# Patient Record
Sex: Male | Born: 1945
Health system: Southern US, Community
[De-identification: ages and names within clinical notes are randomized; demographics above are authoritative.]

## PROBLEM LIST (undated history)

## (undated) DIAGNOSIS — Z8719 Personal history of other diseases of the digestive system: Secondary | ICD-10-CM

## (undated) DIAGNOSIS — I219 Acute myocardial infarction, unspecified: Secondary | ICD-10-CM

## (undated) DIAGNOSIS — I77810 Thoracic aortic ectasia: Secondary | ICD-10-CM

## (undated) DIAGNOSIS — F1120 Opioid dependence, uncomplicated: Secondary | ICD-10-CM

## (undated) DIAGNOSIS — R112 Nausea with vomiting, unspecified: Secondary | ICD-10-CM

## (undated) DIAGNOSIS — I6529 Occlusion and stenosis of unspecified carotid artery: Secondary | ICD-10-CM

## (undated) DIAGNOSIS — Z9889 Other specified postprocedural states: Secondary | ICD-10-CM

## (undated) DIAGNOSIS — N200 Calculus of kidney: Secondary | ICD-10-CM

## (undated) DIAGNOSIS — E785 Hyperlipidemia, unspecified: Secondary | ICD-10-CM

## (undated) DIAGNOSIS — E291 Testicular hypofunction: Secondary | ICD-10-CM

## (undated) DIAGNOSIS — I2510 Atherosclerotic heart disease of native coronary artery without angina pectoris: Secondary | ICD-10-CM

## (undated) DIAGNOSIS — I371 Nonrheumatic pulmonary valve insufficiency: Secondary | ICD-10-CM

## (undated) DIAGNOSIS — I10 Essential (primary) hypertension: Secondary | ICD-10-CM

## (undated) DIAGNOSIS — M1990 Unspecified osteoarthritis, unspecified site: Secondary | ICD-10-CM

## (undated) DIAGNOSIS — IMO0001 Reserved for inherently not codable concepts without codable children: Secondary | ICD-10-CM

## (undated) DIAGNOSIS — I5032 Chronic diastolic (congestive) heart failure: Secondary | ICD-10-CM

## (undated) DIAGNOSIS — I5042 Chronic combined systolic (congestive) and diastolic (congestive) heart failure: Secondary | ICD-10-CM

## (undated) DIAGNOSIS — R350 Frequency of micturition: Secondary | ICD-10-CM

## (undated) DIAGNOSIS — N059 Unspecified nephritic syndrome with unspecified morphologic changes: Secondary | ICD-10-CM

## (undated) DIAGNOSIS — F419 Anxiety disorder, unspecified: Secondary | ICD-10-CM

## (undated) DIAGNOSIS — H9190 Unspecified hearing loss, unspecified ear: Secondary | ICD-10-CM

## (undated) HISTORY — DX: Chronic combined systolic (congestive) and diastolic (congestive) heart failure: I50.42

## (undated) HISTORY — DX: Nonrheumatic pulmonary valve insufficiency: I37.1

## (undated) HISTORY — DX: Chronic diastolic (congestive) heart failure: I50.32

## (undated) HISTORY — PX: OTHER SURGICAL HISTORY: SHX169

## (undated) HISTORY — DX: Hyperlipidemia, unspecified: E78.5

## (undated) HISTORY — PX: BILATERAL KNEE ARTHROSCOPY: SUR91

## (undated) HISTORY — PX: CYSTOSCOPY: SUR368

## (undated) HISTORY — PX: CORONARY ARTERY BYPASS GRAFT: SHX141

## (undated) HISTORY — DX: Occlusion and stenosis of unspecified carotid artery: I65.29

## (undated) HISTORY — DX: Thoracic aortic ectasia: I77.810

## (undated) HISTORY — PX: CORONARY ANGIOPLASTY: SHX604

## (undated) HISTORY — PX: LUMBAR LAMINECTOMY: SHX95

---

## 1989-08-30 HISTORY — PX: ANGIOPLASTY: SHX39

## 1989-10-31 DIAGNOSIS — I219 Acute myocardial infarction, unspecified: Secondary | ICD-10-CM

## 1989-10-31 HISTORY — DX: Acute myocardial infarction, unspecified: I21.9

## 1996-08-30 HISTORY — PX: HERNIA REPAIR: SHX51

## 1998-05-01 ENCOUNTER — Ambulatory Visit (HOSPITAL_COMMUNITY): Admission: RE | Admit: 1998-05-01 | Discharge: 1998-05-01 | Payer: Self-pay | Admitting: Neurosurgery

## 1998-05-01 ENCOUNTER — Encounter: Payer: Self-pay | Admitting: Neurosurgery

## 1998-05-12 ENCOUNTER — Ambulatory Visit (HOSPITAL_COMMUNITY): Admission: RE | Admit: 1998-05-12 | Discharge: 1998-05-12 | Payer: Self-pay | Admitting: Neurosurgery

## 1998-05-12 ENCOUNTER — Encounter: Payer: Self-pay | Admitting: Neurosurgery

## 1998-11-01 ENCOUNTER — Ambulatory Visit (HOSPITAL_COMMUNITY): Admission: RE | Admit: 1998-11-01 | Discharge: 1998-11-01 | Payer: Self-pay | Admitting: Neurosurgery

## 1998-11-01 ENCOUNTER — Encounter: Payer: Self-pay | Admitting: Neurosurgery

## 1998-11-03 ENCOUNTER — Encounter: Payer: Self-pay | Admitting: Cardiology

## 1998-11-03 ENCOUNTER — Encounter: Payer: Self-pay | Admitting: Neurosurgery

## 1998-11-03 ENCOUNTER — Inpatient Hospital Stay (HOSPITAL_COMMUNITY): Admission: RE | Admit: 1998-11-03 | Discharge: 1998-11-08 | Payer: Self-pay | Admitting: Cardiology

## 1998-11-04 ENCOUNTER — Encounter: Payer: Self-pay | Admitting: Thoracic Surgery (Cardiothoracic Vascular Surgery)

## 1998-11-05 ENCOUNTER — Encounter: Payer: Self-pay | Admitting: Thoracic Surgery (Cardiothoracic Vascular Surgery)

## 1998-11-06 ENCOUNTER — Encounter: Payer: Self-pay | Admitting: Thoracic Surgery (Cardiothoracic Vascular Surgery)

## 1998-12-02 ENCOUNTER — Encounter (HOSPITAL_COMMUNITY): Admission: RE | Admit: 1998-12-02 | Discharge: 1999-03-02 | Payer: Self-pay | Admitting: Cardiology

## 1999-03-03 ENCOUNTER — Encounter (HOSPITAL_COMMUNITY): Admission: RE | Admit: 1999-03-03 | Discharge: 1999-06-01 | Payer: Self-pay | Admitting: Cardiology

## 1999-04-30 ENCOUNTER — Ambulatory Visit (HOSPITAL_COMMUNITY): Admission: RE | Admit: 1999-04-30 | Discharge: 1999-04-30 | Payer: Self-pay | Admitting: Neurosurgery

## 1999-06-15 ENCOUNTER — Ambulatory Visit (HOSPITAL_COMMUNITY): Admission: RE | Admit: 1999-06-15 | Discharge: 1999-06-15 | Payer: Self-pay | Admitting: Neurosurgery

## 1999-06-15 ENCOUNTER — Encounter: Payer: Self-pay | Admitting: Neurosurgery

## 2000-02-24 ENCOUNTER — Encounter: Admission: RE | Admit: 2000-02-24 | Discharge: 2000-02-24 | Payer: Self-pay | Admitting: Urology

## 2000-02-24 ENCOUNTER — Encounter: Payer: Self-pay | Admitting: Urology

## 2000-06-14 ENCOUNTER — Ambulatory Visit (HOSPITAL_COMMUNITY): Admission: RE | Admit: 2000-06-14 | Discharge: 2000-06-14 | Payer: Self-pay | Admitting: Neurosurgery

## 2000-06-14 ENCOUNTER — Encounter: Payer: Self-pay | Admitting: Neurosurgery

## 2000-06-14 ENCOUNTER — Ambulatory Visit (HOSPITAL_COMMUNITY): Admission: RE | Admit: 2000-06-14 | Discharge: 2000-06-14 | Payer: Self-pay | Admitting: Gastroenterology

## 2000-09-12 ENCOUNTER — Encounter: Admission: RE | Admit: 2000-09-12 | Discharge: 2000-09-30 | Payer: Self-pay | Admitting: Neurosurgery

## 2000-10-25 ENCOUNTER — Emergency Department (HOSPITAL_COMMUNITY): Admission: EM | Admit: 2000-10-25 | Discharge: 2000-10-25 | Payer: Self-pay

## 2001-05-04 ENCOUNTER — Encounter: Payer: Self-pay | Admitting: Urology

## 2001-05-04 ENCOUNTER — Encounter: Admission: RE | Admit: 2001-05-04 | Discharge: 2001-05-04 | Payer: Self-pay | Admitting: Urology

## 2001-05-08 ENCOUNTER — Encounter: Payer: Self-pay | Admitting: Urology

## 2001-05-08 ENCOUNTER — Encounter: Admission: RE | Admit: 2001-05-08 | Discharge: 2001-05-08 | Payer: Self-pay | Admitting: Urology

## 2001-05-25 ENCOUNTER — Encounter: Payer: Self-pay | Admitting: Anesthesiology

## 2001-05-30 ENCOUNTER — Ambulatory Visit (HOSPITAL_COMMUNITY): Admission: RE | Admit: 2001-05-30 | Discharge: 2001-05-30 | Payer: Self-pay | Admitting: Urology

## 2004-08-30 HISTORY — PX: NISSEN FUNDOPLICATION: SHX2091

## 2005-05-11 ENCOUNTER — Ambulatory Visit (HOSPITAL_COMMUNITY): Admission: RE | Admit: 2005-05-11 | Discharge: 2005-05-11 | Payer: Self-pay | Admitting: Pain Medicine

## 2005-08-20 ENCOUNTER — Ambulatory Visit (HOSPITAL_COMMUNITY): Admission: RE | Admit: 2005-08-20 | Discharge: 2005-08-20 | Payer: Self-pay | Admitting: Orthopedic Surgery

## 2006-07-04 ENCOUNTER — Encounter: Admission: RE | Admit: 2006-07-04 | Discharge: 2006-07-04 | Payer: Self-pay | Admitting: Neurosurgery

## 2007-01-17 ENCOUNTER — Inpatient Hospital Stay (HOSPITAL_COMMUNITY): Admission: EM | Admit: 2007-01-17 | Discharge: 2007-01-18 | Payer: Self-pay | Admitting: Emergency Medicine

## 2007-01-17 HISTORY — PX: CARDIAC CATHETERIZATION: SHX172

## 2009-04-28 ENCOUNTER — Encounter: Admission: RE | Admit: 2009-04-28 | Discharge: 2009-04-28 | Payer: Self-pay | Admitting: Neurosurgery

## 2010-07-17 ENCOUNTER — Ambulatory Visit (HOSPITAL_COMMUNITY)
Admission: RE | Admit: 2010-07-17 | Discharge: 2010-07-17 | Payer: Self-pay | Source: Home / Self Care | Admitting: Orthopedic Surgery

## 2010-09-21 ENCOUNTER — Encounter: Payer: Self-pay | Admitting: Neurosurgery

## 2010-11-10 LAB — BASIC METABOLIC PANEL
BUN: 13 mg/dL (ref 6–23)
CO2: 33 mEq/L — ABNORMAL HIGH (ref 19–32)
Calcium: 9.7 mg/dL (ref 8.4–10.5)
Creatinine, Ser: 0.86 mg/dL (ref 0.4–1.5)
Glucose, Bld: 83 mg/dL (ref 70–99)

## 2010-11-10 LAB — DIFFERENTIAL
Basophils Relative: 0 % (ref 0–1)
Eosinophils Absolute: 0 10*3/uL (ref 0.0–0.7)
Monocytes Relative: 10 % (ref 3–12)
Neutrophils Relative %: 64 % (ref 43–77)

## 2010-11-10 LAB — PROTIME-INR: INR: 0.93 (ref 0.00–1.49)

## 2010-11-10 LAB — URINALYSIS, ROUTINE W REFLEX MICROSCOPIC
Glucose, UA: NEGATIVE mg/dL
Ketones, ur: NEGATIVE mg/dL
pH: 7 (ref 5.0–8.0)

## 2010-11-10 LAB — CBC
MCH: 31.8 pg (ref 26.0–34.0)
MCHC: 35 g/dL (ref 30.0–36.0)
Platelets: 146 10*3/uL — ABNORMAL LOW (ref 150–400)

## 2011-01-12 NOTE — Cardiovascular Report (Signed)
Hayden [name], ELLEN NO.:  [id number]   MEDICAL RECORD NO.:  [id number]          PATIENT TYPE:  INP   LOCATION:  3707                         FACILITY:  [facility]   PHYSICIAN:  Peter M. Swaziland, M.D.  DATE OF BIRTH:  [birth year]   DATE OF PROCEDURE:  01/17/2007  DATE OF DISCHARGE:                            CARDIAC CATHETERIZATION   INDICATIONS FOR PROCEDURE:  The patient is 65 year old white male status  post coronary artery bypass surgery in 2000 who presents with increased  chest pain.   PROCEDURES:  Left heart catheterization, coronary and left ventricular  angiography, saphenous vein graft angiography x1, LIMA graft angiography  and aortic root angiography.   EQUIPMENT USED:  6-French 4 cm right and left Judkins catheter, 6-French  pigtail catheter, 6-French arterial sheath, 6-French multipurpose  catheter, 6-French RCV catheter.   ACCESS:  Via the right femoral artery using standard Seldinger  technique.   MEDICATIONS:  Versed 2 mg IV, fentanyl 25 mg IV, metoprolol 5 mg IV.   HEMODYNAMIC DATA:  Aortic pressures 153/74 with mean of 111. Left  ventricle pressure is 154 with EDP of 10 mmHg.   CONTRAST:  210 mL of Omnipaque.   ANGIOGRAPHIC DATA:  The left coronary arises and distributes normally.  The left main coronary is short and appears normal.   The left anterior descending artery has a 95% stenosis proximally and is  occluded after the first septal perforator.   The left circumflex coronary has a 90% ostial stenosis.  It is then  occluded after the first obtuse marginal vessel.  It does give  collateral flow to the distal right coronary.   The right coronary is occluded proximally.   The saphenous vein graft to the third obtuse marginal vessel is widely  patent.  It has excellent distal runoff and supplies the entire mid to  distal circumflex as well as giving collateral blood flow to the distal  right coronary.   The free RIMA graft to the  PDA could not be directly engaged.  Per the  operative note, it arises from the same aortotomy site as the vein  graft.  However, using multiple catheters including those listed above  were unable to directly engage it or see any flush of the origin.   The LIMA graft to the LAD is widely patent with good distal runoff.  The  far distal LAD as it wraps around the apex there is 80-90% stenosis in  one terminal branch of the LAD.   The left ventricular angiography was performed in RAO view.  This  demonstrates normal left ventricular size and contractility with normal  systolic function.  Ejection fraction is estimated 65%.  There is no  significant mitral insufficiency.   Aortic root angiography was performed in LAO view.  This demonstrates  normal aortic root size with no aortic insufficiency.  Again we were  unable to visualize any flow into the RIMA graft.   FINAL INTERPRETATION:  1. Severe three-vessel obstructive atherosclerotic coronary artery      disease.  2. Patent saphenous vein graft to the third  obtuse marginal vessel.  3. Patent LIMA graft to the LAD.  4. Occluded at free RIMA graft to the PDA.  The PDA is supplied by      collateral flow.  5. Normal left ventricular function.  6. Normal aortic root.   PLAN:  I would recommend continued medical therapy.           ______________________________  Peter M. Swaziland, M.D.     PMJ/MEDQ  D:  01/17/2007  T:  01/17/2007  Job:  [id number]   cc:   Cassell Clement, M.D.

## 2011-01-12 NOTE — H&P (Signed)
NAMEDENSIL, OTTEY NO.:  [id number]   MEDICAL RECORD NO.:  [id number]          PATIENT TYPE:  EMS   LOCATION:  MAJO                         FACILITY:  [facility]   PHYSICIAN:  Ulyses Amor, MD DATE OF BIRTH:  1946/01/30   DATE OF PROCEDURE:  DATE OF DISCHARGE:                    STAT - MUST CHANGE TO CORRECT WORK TYPE   Hayden [name] is a 64 year old white man who is admitted to [facility] with a complaint of indigestion in order to exclude unstable  angina.   The patient has a history of coronary artery disease.  He suffered a  remote myocardial infarction.  In 1991 he underwent PTCA of the  circumflex.  In 2000 he underwent coronary artery bypass surgery.  He  received a left internal mammary artery graft for the LAD, a right  internal mammary artery graft to the PDA, and a saphenous vein graft to  the third obtuse marginal.  His cardiac course has been uncomplicated  since then.  He has experienced no recurrent chest pain.  Nor has he  experienced any congestive heart failure or arrhythmia.   The patient was eating dinner this evening when he experienced the  sensation of indigestion across his chest.  This was associated with  diaphoresis.  There was no dyspnea or nausea.  It persisted for  approximately 2 hours and ultimately resulted in his visit to the  emergency department.  His discomfort has resolved since then.  There  were no exacerbating or ameliorating factors.  It appeared to be  unrelated to position, activity, meals or respirations.  He did not  experience a sensation of tightness, pressure, or squeezing.  He notes  that this chest discomfort is different from that which he experienced  with his myocardial infarction and prior to his PTCA and coronary artery  bypass surgery.  He is asymptomatic at this time.   The patient has a number of risk factors for coronary artery disease  including hypertension, dyslipidemia, and family  history (both parents).  There was no history of smoking or diabetes mellitus.   The patient has a history of gastroesophageal reflux.  He notes that  today's chest discomfort is different from his typical reflux  discomfort.  He also has a history of nephrolithiasis.  His past medical  history is otherwise unremarkable.   MEDICATIONS:  1. Cozaar.  2. Crestor.  3. Ziac.  4. Zoloft.   ALLERGIES:  SULFA.   OPERATIONS:  Lumbar laminectomy, lithotripsy, inguinal hernia repair,  coronary artery bypass surgery.   SOCIAL:  The patient lives with his wife.  He works for [employer].  He  does not smoke cigarettes.  He does not drink significant alcohol.   FAMILY HISTORY:  Notable for coronary artery disease in both of his  parents.   REVIEW OF SYSTEMS:  Reveals no new problems related to his head, eyes,  ears, nose, mouth, throat, lungs, gastrointestinal system, genitourinary  system, or extremities.  There is no history of neurologic or  psychiatric disorder.  There is no history of fever, chills, or weight  loss.   PHYSICAL  EXAMINATION:  VITAL SIGNS: Blood pressure 157/71, pulse 74 and  regular, respirations 22, temperature 97.6.  IN GENERAL: The patient was a middle-aged white man in no discomfort.  He was alert, oriented, appropriate, and responsive.  HEAD, EYES, NOSE, AND MOUTH: Normal.  The neck was without thyromegaly  or adenopathy.  Carotid pulses were palpable bilaterally and without  bruits.  CARDIAC: Revealed a normal S1/S2.  There was no S3, S4, murmur, rub, or  click.  Cardiac rhythm is normal.  No chest wall tenderness was noted.  LUNGS: Clear.  ABDOMEN: Soft and nontender.  There was no mass, hepatosplenomegaly,  bruit, distention, rebound, guarding or rigidity.  Bowel sounds were  normal.  RECTAL AND GENITAL: Examinations were not performed as they were not  pertinent to the reason for acute care hospitalization.  EXTREMITIES: Without edema, deviation, or  deformity.  Radial and  dorsalis pedal pulses were palpable bilaterally.  NEURO: Brief screening neurologic survey was unremarkable.   Electrocardiogram revealed normal sinus rhythm.  Left atrial enlargement  was noted.  There was T-wave inversion and ST segment depression in the  anterior lateral leads.  The chest radiograph, according to the  radiologist, demonstrated minimal atelectasis versus scarring of the  left base.  The initial set of cardiac markers revealed a myoglobin of  58.7, CK-MB 1.2, and troponin less than 0.05.  White count was 9.1 with  a hemoglobin of 12.4 and hematocrit of 35.5.  Potassium is 3.4, BUN 12,  and creatinine 0.8.  Fibrin derivatives were less than 0.22.  The  remaining studies were pending at the time of this dictation.   IMPRESSION:  1. Indigestion; rule out unstable angina.  2. Coronary artery disease status post remote myocardial infarction,      status post circumflex percutaneous transluminal coronary      angioplasty in 1991.  Status post coronary artery bypass surgery in      2000 with a left internal mammary artery graft to the left anterior      descending, right internal mammary artery graft to the posterior      descending artery and saphenous graft to the third obtuse marginal.  3. Hypertension.  4. Dyslipidemia.  5. Gastroesophageal reflux.  6. Anemia.   PLAN:  1. Telemetry.  2. Serial cardiac enzymes.  3. Aspirin.  4. Intravenous heparin.  5. Intravenous nitroglycerin.  6. Further measures per Dr. Patty Sermons.      Ulyses Amor, MD  Electronically Signed     MSC/MEDQ  D:  01/17/2007  T:  01/17/2007  Job:  [id number]   cc:   Cassell Clement, M.D.

## 2011-01-12 NOTE — H&P (Signed)
NAMEHAGOP, MCCOLLAM NO.:  [id number]   MEDICAL RECORD NO.:  [id number]          PATIENT TYPE:  EMS   LOCATION:  MAJO                         FACILITY:  [facility]   PHYSICIAN:  Ulyses Amor, MD DATE OF BIRTH:  [birth year]   DATE OF PROCEDURE:  DATE OF DISCHARGE:                    STAT - MUST CHANGE TO CORRECT WORK TYPE   Taran is a 65 year old white man who is admitted to [facility]  with a complaint of indigestion in order to exclude unstable angina.   The patient has a history of coronary artery disease.  He suffered a  remote myocardial infarction.  In 1991 he underwent PTCA of the  circumflex.  In 2000 he underwent coronary artery bypass surgery.  He  received a left internal mammary artery graft for the LAD, a right  internal mammary artery graft to the PDA, and a saphenous vein graft to  the third obtuse marginal.  His cardiac course has been uncomplicated  since then.  He has experienced no recurrent chest pain.  Nor has he  experienced any congestive heart failure or arrhythmia.   The patient was eating dinner this evening when he experienced the  sensation of indigestion across his chest.  This was associated with  diaphoresis.  There was no dyspnea or nausea.  It persisted for  approximately 2 hours and ultimately resulted in his visit to the  emergency department.  His discomfort has resolved since then.  There  were no exacerbating or ameliorating factors.  It appeared to be  unrelated to position, activity, meals or respirations.  He did not  experience a sensation of tightness, pressure, or squeezing.  He notes  that this chest discomfort is different from that which he experienced  with his myocardial infarction and prior to his PTCA and coronary artery  bypass surgery.  He is asymptomatic at this time.   The patient has a number of risk factors for coronary artery disease  including hypertension, dyslipidemia, and family history  (both parents).  There was no history of smoking or diabetes mellitus.   The patient has a history of gastroesophageal reflux.  He notes that  today's chest discomfort is different from his typical reflux  discomfort.  He also has a history of nephrolithiasis.  His past medical  history is otherwise unremarkable.   MEDICATIONS:  1. Cozaar.  2. Crestor.  3. Ziac.  4. Zoloft.   ALLERGIES:  SULFA.   OPERATIONS:  Lumbar laminectomy, lithotripsy, inguinal hernia repair,  coronary artery bypass surgery.   SOCIAL:  The patient lives with his wife.  He works for [employer].  He  does not smoke cigarettes.  He does not drink significant alcohol.   FAMILY HISTORY:  Notable for coronary artery disease in both of his  parents.   REVIEW OF SYSTEMS:  Reveals no new problems related to his head, eyes,  ears, nose, mouth, throat, lungs, gastrointestinal system, genitourinary  system, or extremities.  There is no history of neurologic or  psychiatric disorder.  There is no history of fever, chills, or weight  loss.   PHYSICAL EXAMINATION:  VITAL SIGNS: Blood pressure 157/71, pulse 74 and  regular, respirations 22, temperature 97.6.  IN GENERAL: The patient was a middle-aged white man in no discomfort.  He was alert, oriented, appropriate, and responsive.  HEAD, EYES, NOSE, AND MOUTH: Normal.  The neck was without thyromegaly  or adenopathy.  Carotid pulses were palpable bilaterally and without  bruits.  CARDIAC: Revealed a normal S1/S2.  There was no S3, S4, murmur, rub, or  click.  Cardiac rhythm is normal.  No chest wall tenderness was noted.  LUNGS: Clear.  ABDOMEN: Soft and nontender.  There was no mass, hepatosplenomegaly,  bruit, distention, rebound, guarding or rigidity.  Bowel sounds were  normal.  RECTAL AND GENITAL: Examinations were not performed as they were not  pertinent to the reason for acute care hospitalization.  EXTREMITIES: Without edema, deviation, or deformity.   Radial and  dorsalis pedal pulses were palpable bilaterally.  NEURO: Brief screening neurologic survey was unremarkable.   Electrocardiogram revealed normal sinus rhythm.  Left atrial enlargement  was noted.  There was T-wave inversion and ST segment depression in the  anterior lateral leads.  The chest radiograph, according to the  radiologist, demonstrated minimal atelectasis versus scarring of the  left base.  The initial set of cardiac markers revealed a myoglobin of  58.7, CK-MB 1.2, and troponin less than 0.05.  White count was 9.1 with  a hemoglobin of 12.4 and hematocrit of 35.5.  Potassium is 3.4, BUN 12,  and creatinine 0.8.  Fibrin derivatives were less than 0.22.  The  remaining studies were pending at the time of this dictation.   IMPRESSION:  1. Indigestion; rule out unstable angina.  2. Coronary artery disease status post remote myocardial infarction,      status post circumflex percutaneous transluminal coronary      angioplasty in 1991.  Status post coronary artery bypass surgery in      2000 with a left internal mammary artery graft to the left anterior      descending, right internal mammary artery graft to the posterior      descending artery and saphenous graft to the third obtuse marginal.  3. Hypertension.  4. Dyslipidemia.  5. Gastroesophageal reflux.  6. Anemia.   PLAN:  1. Telemetry.  2. Serial cardiac enzymes.  3. Aspirin.  4. Intravenous heparin.  5. Intravenous nitroglycerin.  6. Further measures per Dr. Patty Sermons.      Ulyses Amor, MD     MSC/MEDQ  D:  01/17/2007  T:  01/17/2007  Job:  [id number]   cc:   Cassell Clement, M.D.

## 2011-01-15 NOTE — Procedures (Signed)
[facility]  Patient:    Hayden [name], Hayden [name]                        MRN: [id number] Proc. Date: 06/14/00 Adm. Date:  [id number] Attending:  Louie Bun CC:         Arvella Merles, M.D.                           Procedure Report  PROCEDURE PERFORMED:  Colonoscopy.  INDICATIONS FOR PROCEDURE:  History of adenomatous colon polyps on last colonoscopy 3 years ago.  DESCRIPTION OF PROCEDURE:  The patient was placed in the left lateral decubitus position and placed on the pulse monitor with continuous low-flow oxygen delivered by nasal cannula.  He was sedated with 80 mg of IV Demerol and 8.5 mg of IV Versed.  The Olympus video colonoscope was inserted into the rectum and advanced to the cecum, confirmed by transillumination McBurneys point and visualization to the ileocecal valve and appendicial orifice.  The prep was good.  The cecum, ascending, transverse, descending, sigmoid colon all appeared normal with no masses, polyps, diverticula, or other mucosal abnormalities.  The rectum likewise appeared normal on retroflexed view.  The anus revealed no obvious internal hemorrhoids.  The colonoscope was then withdrawn and the patient returned to the recovery room in stable condition. He tolerated the procedure well and there were no immediate complications.  IMPRESSION:  Essentially normal colonoscopy.  PLAN:  Will repeat colonoscopy in five years. DD:  06/14/00 TD:  06/15/00 Job: [id number]

## 2011-01-15 NOTE — Procedures (Signed)
[facility]  Patient:    Hayden [name], Hayden [name] Visit Number: [id number] MRN: [id number]          Service Type: DSU Location: DAY Attending Physician:  Londell Moh Dictated by:   Jamison Neighbor, M.D. Proc. Date: 05/30/01 Admit Date:  05/30/2001   CC:         Arvella Merles, M.D.   Procedure Report  SERVICE:  Urology  PREOPERATIVE DIAGNOSIS:  Left flank pain, possible distal left ureteral calculus.  POSTOPERATIVE DIAGNOSIS:  Left flank pain, possible distal left ureteral calculus.  PROCEDURE:  Cystoscopy, left retrograde, and left ureteroscopy.  SURGEON:  Jamison Neighbor, M.D.  ANESTHESIA:  General.  COMPLICATIONS:  None.  DRAINS:  None.  BRIEF HISTORY:  This 65 year old male has had a long-standing history of stones.  The patient was evaluated by Dr. Lilli Light, at [facility] [place].  He was treated with thiazides which were not particularly helpful. The patient was felt by Dr. Katrinka Blazing, who is an acknowledged expert in metabolic stone evaluation, that there was not much more that could be done from a medication standpoint.  The patient mentioned that he had passed over 100 stones and has required lithotripsy in the past.  The patient now has left-sided flank pain.  A CT scan showed two calcifications in the distal left ureter that were thought to possibly represent stones.  The patient was given nearly a month to try and pass these and has not been successful.  He has had persistent pain throughout that month.  It is certainly possible, however, that these are not obstructing stones and that the patient has pain from a non-urologic source.  The patient is now to undergo retrogrades and possible ureteroscopy to determine if the ureter is normal.  He has requested that if at all possible, a stent now be placed.  He gave full and informed consent.  DESCRIPTION OF PROCEDURE:  After successful induction of general  anesthesia, the patient was placed in the dorsal lithotomy position and prepped with Betadine and draped in the usual sterile fashion.  Cystoscopy was performed. The urethra was visualized in its entirety and was found to be normal.  Beyond the verumontanum, the patient did not have significant prostatic enlargement. The bladder itself was carefully inspected; it was free of any tumor or stones.  Both ureteral orifices were normal in configuration and location. Retrograde study performed on the left-hand side showed a normal-caliber ureter with no signs of a filling defect.  A guidewire was passed.  The ureteroscope was then advanced along the course of the ureter without the need for dilation.  The ureter was evaluated all the way up to the level of the UPJ with no signs of stenosis, stricture, stone or tumor.  The ureteroscope was withdrawn and the ureter was carefully evaluated and appeared to be free of any obstruction.  At the patients request, a double J catheter was not left in place.  The bladder was drained.  Lidocaine jelly was placed into the urethra.  A B & O suppository was inserted.  Patient tolerated the procedure well and was taken to the recovery room in good condition. Dictated by:   Jamison Neighbor, M.D. Attending Physician:  Londell Moh DD:  05/30/01 TD:  05/31/01 Job: [id number]

## 2011-01-15 NOTE — Discharge Summary (Signed)
Hayden [name], Hayden [name]                 ACCOUNT NO.:  [id number]   MEDICAL RECORD NO.:  [id number]          PATIENT TYPE:  INP   LOCATION:  3707                         FACILITY:  [facility]   PHYSICIAN:  Cassell Clement, M.D. DATE OF BIRTH:  07-22-1946   DATE OF ADMISSION:  01/16/2007  DATE OF DISCHARGE:  01/18/2007                               DISCHARGE SUMMARY   FINAL DIAGNOSES:  1. Chest pain, myocardial infarction ruled out.  2. Prior myocardial infarction.  3. Status post coronary artery bypass graft.  4. Status post percutaneous transluminal coronary angioplasty.  5. Hyperlipidemia.  6. Anemia.  7. Personal history of urinary calculi.  8. Gastroesophageal reflux.   OPERATIONS PERFORMED:  Left heart cardiac catheterization on Jan 17, 2007 by Dr. Peter Swaziland.   HISTORY:  This 65 year old gentleman was admitted as an emergency by Dr.  Lemmie Evens because of chest pain.  He has a history of coronary  disease and had had a remote myocardial infarction.  He had a PTCA of  his circumflex in 1991, and in 2000 he underwent coronary artery bypass  graft surgery, with LIMA to his LAD, a right internal mammary graft to  his PDA, and a saphenous vein graft to the third obtuse marginal.  He  has done well since, but on the day of admission he was eating dinner  and he developed a sensation of indigestion across his chest associated  with diaphoresis.  It persisted about 2 hours and ultimately resulted in  his visit to the emergency room.  He felt that the pain was different  from that which he had previous experience with his myocardial  infarction.  At the time that he was seen by Dr. Waldon Reining, he was pain  free, but he did have numerous risk factors for coronary disease,  including hypertension, dyslipidemia, and family history.  The patient  is a nonsmoker, and he is not diabetic.  He does have a history of  gastroesophageal reflux, but noted that today's discomfort was also  different from his typical reflux discomfort.  The patient has a history  of back pain and nephrolithiasis.  He has had multiple surgical  procedures, including lumbar laminectomy, lithotripsy, inguinal hernia  repair, and coronary bypass surgery.   PHYSICAL EXAMINATION:  VITAL SIGNS:  Blood pressure 157/71, pulse 74,  respirations 22, temperature 97.6.  GENERAL:  This is a middle-aged white male in no distress.  HEAD/NECK:  Unremarkable.  HEART:  Reveals no gallop, murmur, click, or rub.  ABDOMEN:  Negative.  LUNGS:  Clear.  EXTREMITIES:  No edema or phlebitis.   HOSPITAL COURSE:  The patient was evaluated in the emergency room.  His  chest x-ray showed minimal atelectasis at the left base.  Initial  cardiac markers were negative.  His potassium was 3.4, BUN was 12.  Fibrinogen derivatives were less than 0.22.  EKG showed T-wave inversion  and ST-segment depression in the anterior leads laterally.  The patient  was admitted to telemetry.  Serial enzymes were obtained.  He was begun  on aspirin and intravenous heparin and intravenous  nitroglycerin.  He  was seen that same morning by Dr. Swaziland, who agreed that the patient  should undergo cardiac catheterization to rule out significant stenotic  lesion and to guide future therapy.  Cardiac catheterization was  performed by Dr. Swaziland and showed severe three-vessel coronary disease.  It showed a patent saphenous vein graft to the obtuse marginal 3, patent  LIMA to the LAD, and he had an occluded free right internal mammary  artery graft to the PDA, but there was good collateral flow, and he had  normal left ventricular function.  It was felt that the patient would do  well on continued medical therapy.  The patient was observed overnight  and was able to be discharged on the morning of Jan 18, 2007, improved,  with no further chest pain.   DISCHARGE MEDICATIONS:  1. Ziac 5 mg daily.  2. Crestor 10 mg daily.  3. Zoloft 50 mg twice a  day.  4. Cozaar once a day, same dose as previously.  5. Nitrostat 1/150 sublingually p.r.n.  6. Baby aspirin 81 mg every other day.  7. Generic Percocet 10/650, 1 q.6 h. p.r.n. for severe back pain.   He will continue on a low-sodium, heart-healthy diet.  He is to increase  activity slowly.  He is to keep his office visit appointment with Dr.  Patty Sermons in June 2008 as scheduled.   CONDITION ON DISCHARGE:  Improved.           ______________________________  Cassell Clement, M.D.     TB/MEDQ  D:  02/28/2007  T:  02/28/2007  Job:  [id number]

## 2011-10-18 ENCOUNTER — Encounter (HOSPITAL_COMMUNITY): Payer: Self-pay | Admitting: Pharmacy Technician

## 2011-10-19 ENCOUNTER — Ambulatory Visit (HOSPITAL_COMMUNITY)
Admission: RE | Admit: 2011-10-19 | Discharge: 2011-10-19 | Disposition: A | Discharge status: Home / Self Care | Payer: 59 | Source: Ambulatory Visit | Attending: Orthopedic Surgery | Admitting: Orthopedic Surgery

## 2011-10-19 ENCOUNTER — Encounter (HOSPITAL_COMMUNITY)
Admission: RE | Admit: 2011-10-19 | Discharge: 2011-10-19 | Disposition: A | Discharge status: Home / Self Care | Payer: 59 | Source: Ambulatory Visit | Attending: Orthopedic Surgery | Admitting: Orthopedic Surgery

## 2011-10-19 ENCOUNTER — Encounter (HOSPITAL_COMMUNITY): Payer: Self-pay

## 2011-10-19 DIAGNOSIS — M1710 Unilateral primary osteoarthritis, unspecified knee: Secondary | ICD-10-CM | POA: Insufficient documentation

## 2011-10-19 DIAGNOSIS — Z01812 Encounter for preprocedural laboratory examination: Secondary | ICD-10-CM | POA: Insufficient documentation

## 2011-10-19 DIAGNOSIS — Z951 Presence of aortocoronary bypass graft: Secondary | ICD-10-CM | POA: Insufficient documentation

## 2011-10-19 DIAGNOSIS — I10 Essential (primary) hypertension: Secondary | ICD-10-CM | POA: Insufficient documentation

## 2011-10-19 DIAGNOSIS — I2510 Atherosclerotic heart disease of native coronary artery without angina pectoris: Secondary | ICD-10-CM | POA: Insufficient documentation

## 2011-10-19 DIAGNOSIS — Z01818 Encounter for other preprocedural examination: Secondary | ICD-10-CM | POA: Insufficient documentation

## 2011-10-19 DIAGNOSIS — I252 Old myocardial infarction: Secondary | ICD-10-CM | POA: Insufficient documentation

## 2011-10-19 HISTORY — DX: Acute myocardial infarction, unspecified: I21.9

## 2011-10-19 HISTORY — DX: Unspecified hearing loss, unspecified ear: H91.90

## 2011-10-19 HISTORY — DX: Unspecified nephritic syndrome with unspecified morphologic changes: N05.9

## 2011-10-19 HISTORY — DX: Personal history of other diseases of the digestive system: Z87.19

## 2011-10-19 HISTORY — DX: Nausea with vomiting, unspecified: R11.2

## 2011-10-19 HISTORY — DX: Other specified postprocedural states: Z98.890

## 2011-10-19 HISTORY — DX: Unspecified osteoarthritis, unspecified site: M19.90

## 2011-10-19 HISTORY — DX: Frequency of micturition: R35.0

## 2011-10-19 HISTORY — DX: Atherosclerotic heart disease of native coronary artery without angina pectoris: I25.10

## 2011-10-19 HISTORY — DX: Reserved for inherently not codable concepts without codable children: IMO0001

## 2011-10-19 HISTORY — DX: Essential (primary) hypertension: I10

## 2011-10-19 HISTORY — DX: Calculus of kidney: N20.0

## 2011-10-19 LAB — SURGICAL PCR SCREEN
MRSA, PCR: NEGATIVE
Staphylococcus aureus: POSITIVE — AB

## 2011-10-19 LAB — CBC
MCHC: 34.6 g/dL (ref 30.0–36.0)
Platelets: 224 10*3/uL (ref 150–400)
RDW: 12.7 % (ref 11.5–15.5)

## 2011-10-19 LAB — URINALYSIS, ROUTINE W REFLEX MICROSCOPIC
Glucose, UA: NEGATIVE mg/dL
Hgb urine dipstick: NEGATIVE
Leukocytes, UA: NEGATIVE
Specific Gravity, Urine: 1.019 (ref 1.005–1.030)
Urobilinogen, UA: 0.2 mg/dL (ref 0.0–1.0)

## 2011-10-19 LAB — APTT: aPTT: 29 seconds (ref 24–37)

## 2011-10-19 LAB — COMPREHENSIVE METABOLIC PANEL
ALT: 20 U/L (ref 0–53)
Albumin: 4.1 g/dL (ref 3.5–5.2)
Alkaline Phosphatase: 62 U/L (ref 39–117)
Calcium: 9.9 mg/dL (ref 8.4–10.5)
Potassium: 3.8 mEq/L (ref 3.5–5.1)
Sodium: 134 mEq/L — ABNORMAL LOW (ref 135–145)
Total Protein: 7.2 g/dL (ref 6.0–8.3)

## 2011-10-19 NOTE — Pre-Procedure Instructions (Signed)
ekg 4-4- 12 dt turner on chart  lov note dr Mayford Knife 07-29-11 on chart Cardiac clearance note dr Mayford Knife on chart

## 2011-10-19 NOTE — Patient Instructions (Signed)
20 Hayden [name]  10/19/2011   Your procedure is scheduled on: 10-27-11  Report to [facility] at  1115  AM.  Call this number if you have problems the morning of surgery: [phone]   Remember:   Do not eat food After Midnight. Clear liquids midnight until 745 am then nothing  Take these medicines the morning of surgery with A SIP OF WATER: norvasc, cymbalta, vytorin, percocoet if needed   Do not wear jewelry...  Do not wear lotions, powders, or perfumes. Do not wear deodorant.  .  Do not bring valuables to the hospital.  Contacts, dentures or bridgework may not be worn into surgery.  Leave suitcase in the car. After surgery it may be brought to your room.  For patients admitted to the hospital, checkout time is 11:00 AM the day of discharge.     Special Instructions: CHG Shower Use Special Wash: 1/2 bottle night before surgery and 1/2 bottle morning of surgery.neck down avoid private area   Please read over the following fact sheets that you were given: MRSA Information  Jasmine December  rn [facility] pre op nurse phone number [phone] call if needed

## 2011-10-24 ENCOUNTER — Other Ambulatory Visit: Payer: Self-pay | Admitting: Orthopedic Surgery

## 2011-10-24 NOTE — H&P (Signed)
Hayden [name]  DOB: 1946/05/11 Married / Language: English / Race: White / Male  Date of Admission:  10/25/2011  Chief Complaint:  Right Knee Pain  History of Present Illness The patient is a 66 year old male who comes in today for a preoperative History and Physical. The patient is scheduled for a right total knee arthroplasty to be performed by Dr. Gus [name]. Aluisio, MD at [facility] on 10/27/2011. The patient is a 66 year old male who presents with knee complaints. The patient was seen in referral from Dr. Ranell [name]. The patient reports left knee and right knee symptoms. and include knee pain, swelling and locking. Onset of symptoms was gradual. Note for "Knee pain": Ready for surgery. Had cortisone injections done in the past. Injections dont help as well anymore. Takes advil for knee pain.  Hayden [name] states that the knees are bothering him at all times. The right is worse than the left. Both knees hurt. The right knee is more symptomatic. This is starting to limit what he can and can not do. He has had cortisone injections in the past. Initially, this was with good benefit but the most recent injections did not help. Visco supplements also have not helped. He feels the knee does want to give out on him at times. He is ready to proceed with surgery. They have been treated conservatively in the past for the above stated problem and despite conservative measures, they continue to have progressive pain and severe functional limitations and dysfunction. They have failed non-operative management including home exercise, medications, and injections. It is felt that they would benefit from undergoing total joint replacement. Risks and benefits of the procedure have been discussed with the patient and they elect to proceed with surgery. There are no active contraindications to surgery such as ongoing infection or rapidly progressive neurological disease.  Allergies SULFA. 06/18/2008  webbing of hands and feet CILLINS. Penicillian and derivates caused sickness. NSAIDs  Medication History TraMADol HCl (50MG  Tablet, Oral as needed) Active. Percocet (5-325MG  Tablet, 1 Oral TAKE 1-2 TAB PO Q4-6HR PRN PAIN, Taken starting 10/06/2011) Active. Advil ( Oral) Specific dose unknown - Active. ((prn for knees)) Ziac ( Oral) Specific dose unknown - Active. Benicar ( Oral) Specific dose unknown - Active. Norvasc ( Oral) Specific dose unknown - Active. Vytorin ( Oral) Specific dose unknown - Active. Cymbalta ( Oral) Specific dose unknown - Active. Aspirin Childrens ( Oral) Specific dose unknown - Active. Vitamin D ( Oral) Specific dose unknown - Active.  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 Has been on chronic pain meds Coronary artery disease Depression High blood pressure Irritable bowel syndrome Kidney Stone. Multiple Stones Myocardial infarction. March 4,1991 Skin Cancer Hiatal Hernia  Past Surgical History Arthroscopic Shoulder Surgery - Left Low Back Disc Surgery hernia Coronary Artery Bypass, Three. Date: 10/1998. lithotripsy Arthroscopy of Knee. right Arthroscopy of Shoulder. left  Family History Cancer. mother and father Diabetes Mellitus. child Drug / Alcohol Addiction. mother and father Heart Disease. mother and father Hypertension. mother and father Father. Cancer. age 19 Mother. Cancer. age 45  Social History No alcohol use Alcohol use. current drinker; drinks beer; only occasionally per week Children. 2 Current work status. working full time Exercise. Exercises weekly; does running / walking and individual sport Living situation. live with spouse Marital status. married Illicit drug use. no Number of flights of stairs before winded. 2-3 Tobacco use. Never smoker. never  smoker Post-Surgical Plans. Plan is to look into [facility] Advance Directives. Living Will, Healthcare POA  Review of Systems General:Not Present- Chills, Fever, Night Sweats, Fatigue, Weight Gain, Weight Loss and Memory Loss. Skin:Not Present- Hives, Itching, Rash, Eczema and Lesions. HEENT:Not Present- Tinnitus, Headache, Double Vision, Visual Loss, Hearing Loss and Dentures. Respiratory:Not Present- Shortness of breath with exertion, Shortness of breath at rest, Allergies, Coughing up blood and Chronic Cough. Cardiovascular:Not Present- Chest Pain, Racing/skipping heartbeats, Difficulty Breathing Lying Down, Murmur, Swelling and Palpitations. Gastrointestinal:Not Present- Bloody Stool, Heartburn, Abdominal Pain, Vomiting, Nausea, Constipation, Diarrhea, Difficulty Swallowing, Jaundice and Loss of appetitie. Male Genitourinary:Present- Urinating at Night. Not Present- Urinary frequency, Blood in Urine, Weak urinary stream, Discharge, Flank Pain, Incontinence, Painful Urination, Urgency and Urinary Retention. Musculoskeletal:Present- Joint Pain and Back Pain. Not Present- Muscle Weakness, Muscle Pain, Joint Swelling, Morning Stiffness and Spasms. Neurological:Not Present- Tremor, Dizziness, Blackout spells, Paralysis, Difficulty with balance and Weakness. Psychiatric:Not Present- Insomnia.  Vitals Weight: 179 lb Height: 68 in Body Surface Area: 1.97 m Body Mass Index: 27.22 kg/m Pulse: 88 (Regular) Resp.: 16 (Unlabored) BP: 124/68 (Sitting, Left Arm, Standard)  Physical Exam Patient is a 66 year old male with continued knee pain.  General Mental Status - Alert, cooperative and good historian. General Appearance- pleasant. Not in acute distress. Orientation- Oriented X3. Build & Nutrition- Well nourished and Well developed.  Head and Neck Head- normocephalic, atraumatic . Neck Global Assessment- supple. no bruit auscultated on the right and no bruit auscultated on the left. partial upper plate bilateral  hearing aids  ENT Pupil- Bilateral- Regular and Round. Motion- Bilateral- EOMI. wears glasses  Chest and Lung Exam Auscultation: Breath sounds:- clear at anterior chest wall and - clear at posterior chest wall. Adventitious sounds:- No Adventitious sounds.  Cardiovascular Auscultation:Rhythm- Regular rate and rhythm. Heart Sounds- S1 WNL and S2 WNL. Murmurs & Other Heart Sounds:Auscultation of the heart reveals - No Murmurs.  Abdomen Palpation/Percussion:Tenderness- Abdomen is non-tender to palpation. Rigidity (guarding)- Abdomen is soft. Auscultation:Auscultation of the abdomen reveals - Bowel sounds normal.  Male Genitourinary Not done, not pertinent to present illness  Musculoskeletal He is a well developed male. He is alert and oriented. He is in no apparent distress. Both hips show normal motion. No discomfort. His right knee shows no effusion. There is marked crepitus on range of motion and in fact there is actually "squeaking" of the bone surfaces rubbing against each other. His range is about 5-125. There is no instability about the knee. The left knee with no effusion. There is moderate crepitus on range of motion. He is tender medial greater than lateral. There is no instability. Range is 5-125 degrees. Pulse, sensation and motor are intact.  RADIOGRAPHS: AP of both knees and lateral show a severe erosive patellofemoral arthritis of the right knee. There is some spurring in the medial and lateral compartments but no joint space loss. His arthritis is horrible with erosion of the patella. The left knee has patellofemoral arthritis that is also advanced but to a lesser degree than the right.  Assessment & Plan Osteoarthritis Right Knee  Patient is for a Right Total Knee Replacement by Dr. Lequita Halt.  Plan is to go to [facility] after the surgery.  PCP - Dr. Ewing Schlein Cards - Dr. Rochel Brome, PA-C

## 2011-10-27 ENCOUNTER — Inpatient Hospital Stay (HOSPITAL_COMMUNITY)
Admission: RE | Admit: 2011-10-27 | Discharge: 2011-10-30 | DRG: 470 | Disposition: A | Discharge status: Home Health | Payer: 59 | Source: Ambulatory Visit | Attending: Orthopedic Surgery | Admitting: Orthopedic Surgery

## 2011-10-27 ENCOUNTER — Encounter (HOSPITAL_COMMUNITY)
Admission: RE | Disposition: A | Discharge status: Home Health | Payer: Self-pay | Source: Ambulatory Visit | Attending: Orthopedic Surgery

## 2011-10-27 ENCOUNTER — Encounter (HOSPITAL_COMMUNITY): Payer: Self-pay | Admitting: *Deleted

## 2011-10-27 ENCOUNTER — Inpatient Hospital Stay (HOSPITAL_COMMUNITY): Payer: 59 | Admitting: *Deleted

## 2011-10-27 DIAGNOSIS — I10 Essential (primary) hypertension: Secondary | ICD-10-CM | POA: Diagnosis present

## 2011-10-27 DIAGNOSIS — E871 Hypo-osmolality and hyponatremia: Secondary | ICD-10-CM | POA: Diagnosis not present

## 2011-10-27 DIAGNOSIS — M1710 Unilateral primary osteoarthritis, unspecified knee: Principal | ICD-10-CM | POA: Diagnosis present

## 2011-10-27 DIAGNOSIS — I252 Old myocardial infarction: Secondary | ICD-10-CM

## 2011-10-27 DIAGNOSIS — I2510 Atherosclerotic heart disease of native coronary artery without angina pectoris: Secondary | ICD-10-CM | POA: Diagnosis present

## 2011-10-27 DIAGNOSIS — D62 Acute posthemorrhagic anemia: Secondary | ICD-10-CM | POA: Diagnosis not present

## 2011-10-27 DIAGNOSIS — Z951 Presence of aortocoronary bypass graft: Secondary | ICD-10-CM

## 2011-10-27 DIAGNOSIS — Z96659 Presence of unspecified artificial knee joint: Secondary | ICD-10-CM

## 2011-10-27 HISTORY — PX: TOTAL KNEE ARTHROPLASTY: SHX125

## 2011-10-27 LAB — TYPE AND SCREEN: Antibody Screen: NEGATIVE

## 2011-10-27 SURGERY — ARTHROPLASTY, KNEE, TOTAL
Anesthesia: Spinal | Site: Knee | Laterality: Right | Wound class: Clean

## 2011-10-27 MED ORDER — DULOXETINE HCL 30 MG PO CPEP
30.0000 mg | ORAL_CAPSULE | Freq: Every day | ORAL | Status: DC
  Filled 2011-10-27: qty 1

## 2011-10-27 MED ORDER — ACETAMINOPHEN 10 MG/ML IV SOLN
1000.0000 mg | Freq: Four times a day (QID) | INTRAVENOUS | Status: AC
  Administered 2011-10-27 – 2011-10-28 (×4): 1000 mg via INTRAVENOUS
  Filled 2011-10-27 (×5): qty 100

## 2011-10-27 MED ORDER — NALOXONE HCL 0.4 MG/ML IJ SOLN: 0.4000 mg | INTRAMUSCULAR | Status: DC | PRN

## 2011-10-27 MED ORDER — VANCOMYCIN HCL IN DEXTROSE 1-5 GM/200ML-% IV SOLN
1000.0000 mg | Freq: Two times a day (BID) | INTRAVENOUS | Status: AC
  Administered 2011-10-28: 1000 mg via INTRAVENOUS
  Filled 2011-10-27: qty 200

## 2011-10-27 MED ORDER — MEPERIDINE HCL 25 MG/ML IJ SOLN: 6.2500 mg | INTRAMUSCULAR | Status: DC | PRN

## 2011-10-27 MED ORDER — RIVAROXABAN 10 MG PO TABS
10.0000 mg | ORAL_TABLET | Freq: Every day | ORAL | Status: DC
  Administered 2011-10-28 – 2011-10-30 (×3): 10 mg via ORAL
  Filled 2011-10-27 (×3): qty 1

## 2011-10-27 MED ORDER — DULOXETINE HCL 30 MG PO CPEP
30.0000 mg | ORAL_CAPSULE | Freq: Every day | ORAL | Status: DC
  Administered 2011-10-28 – 2011-10-30 (×3): 30 mg via ORAL
  Filled 2011-10-27 (×3): qty 1

## 2011-10-27 MED ORDER — EPHEDRINE SULFATE 50 MG/ML IJ SOLN
INTRAMUSCULAR | Status: DC | PRN
  Administered 2011-10-27: 5 mg via INTRAVENOUS

## 2011-10-27 MED ORDER — LACTATED RINGERS IV SOLN: INTRAVENOUS | Status: DC

## 2011-10-27 MED ORDER — FLEET ENEMA 7-19 GM/118ML RE ENEM: 1.0000 | ENEMA | Freq: Once | RECTAL | Status: AC | PRN

## 2011-10-27 MED ORDER — ACETAMINOPHEN 650 MG RE SUPP: 650.0000 mg | Freq: Four times a day (QID) | RECTAL | Status: DC | PRN

## 2011-10-27 MED ORDER — METOCLOPRAMIDE HCL 5 MG/ML IJ SOLN: 5.0000 mg | Freq: Three times a day (TID) | INTRAMUSCULAR | Status: DC | PRN

## 2011-10-27 MED ORDER — ONDANSETRON HCL 4 MG/2ML IJ SOLN
4.0000 mg | Freq: Four times a day (QID) | INTRAMUSCULAR | Status: DC | PRN
  Administered 2011-10-28: 4 mg via INTRAVENOUS
  Filled 2011-10-27: qty 2

## 2011-10-27 MED ORDER — METHOCARBAMOL 100 MG/ML IJ SOLN
500.0000 mg | Freq: Four times a day (QID) | INTRAVENOUS | Status: DC | PRN
  Administered 2011-10-27 (×2): 500 mg via INTRAVENOUS
  Filled 2011-10-27 (×3): qty 5

## 2011-10-27 MED ORDER — VANCOMYCIN HCL IN DEXTROSE 1-5 GM/200ML-% IV SOLN
1000.0000 mg | Freq: Once | INTRAVENOUS | Status: AC
  Administered 2011-10-27: 1000 mg via INTRAVENOUS

## 2011-10-27 MED ORDER — ONDANSETRON HCL 4 MG/2ML IJ SOLN: 4.0000 mg | Freq: Four times a day (QID) | INTRAMUSCULAR | Status: DC | PRN

## 2011-10-27 MED ORDER — HYDROMORPHONE 0.3 MG/ML IV SOLN
INTRAVENOUS | Status: DC
  Administered 2011-10-27: 21:00:00 via INTRAVENOUS

## 2011-10-27 MED ORDER — MORPHINE SULFATE (PF) 1 MG/ML IV SOLN
INTRAVENOUS | Status: AC
  Filled 2011-10-27: qty 25

## 2011-10-27 MED ORDER — PROMETHAZINE HCL 25 MG/ML IJ SOLN: 6.2500 mg | INTRAMUSCULAR | Status: DC | PRN

## 2011-10-27 MED ORDER — PHENOL 1.4 % MT LIQD
1.0000 | OROMUCOSAL | Status: DC | PRN
  Administered 2011-10-28: 1 via OROMUCOSAL
  Filled 2011-10-27: qty 177

## 2011-10-27 MED ORDER — SODIUM CHLORIDE 0.9 % IJ SOLN: 9.0000 mL | INTRAMUSCULAR | Status: DC | PRN

## 2011-10-27 MED ORDER — DIPHENHYDRAMINE HCL 12.5 MG/5ML PO ELIX: 12.5000 mg | ORAL_SOLUTION | ORAL | Status: DC | PRN

## 2011-10-27 MED ORDER — KCL IN DEXTROSE-NACL 20-5-0.9 MEQ/L-%-% IV SOLN
INTRAVENOUS | Status: DC
  Administered 2011-10-27 – 2011-10-28 (×2): via INTRAVENOUS
  Filled 2011-10-27 (×3): qty 1000

## 2011-10-27 MED ORDER — PROPOFOL 10 MG/ML IV EMUL
INTRAVENOUS | Status: DC | PRN
  Administered 2011-10-27: 300 ug/kg/min via INTRAVENOUS

## 2011-10-27 MED ORDER — OXYCODONE HCL 5 MG PO TABS
5.0000 mg | ORAL_TABLET | ORAL | Status: DC | PRN
  Administered 2011-10-27 – 2011-10-30 (×13): 10 mg via ORAL
  Filled 2011-10-27 (×13): qty 2

## 2011-10-27 MED ORDER — EZETIMIBE-SIMVASTATIN 10-20 MG PO TABS
1.0000 | ORAL_TABLET | Freq: Every day | ORAL | Status: DC
  Administered 2011-10-28 – 2011-10-30 (×3): 1 via ORAL
  Filled 2011-10-27 (×4): qty 1

## 2011-10-27 MED ORDER — DIPHENHYDRAMINE HCL 50 MG/ML IJ SOLN: 12.5000 mg | Freq: Four times a day (QID) | INTRAMUSCULAR | Status: DC | PRN

## 2011-10-27 MED ORDER — METHOCARBAMOL 500 MG PO TABS
500.0000 mg | ORAL_TABLET | Freq: Four times a day (QID) | ORAL | Status: DC | PRN
  Administered 2011-10-28: 500 mg via ORAL
  Filled 2011-10-27: qty 1

## 2011-10-27 MED ORDER — LACTATED RINGERS IV SOLN
INTRAVENOUS | Status: DC
  Administered 2011-10-27: 15:00:00 via INTRAVENOUS
  Administered 2011-10-27: 1000 mL via INTRAVENOUS
  Administered 2011-10-27 (×2): via INTRAVENOUS

## 2011-10-27 MED ORDER — HYDROMORPHONE HCL PF 1 MG/ML IJ SOLN: 0.2500 mg | INTRAMUSCULAR | Status: DC | PRN

## 2011-10-27 MED ORDER — ACETAMINOPHEN 10 MG/ML IV SOLN
INTRAVENOUS | Status: DC | PRN
  Administered 2011-10-27: 1000 mg via INTRAVENOUS

## 2011-10-27 MED ORDER — DIPHENHYDRAMINE HCL 12.5 MG/5ML PO ELIX
12.5000 mg | ORAL_SOLUTION | Freq: Four times a day (QID) | ORAL | Status: DC | PRN
  Filled 2011-10-27: qty 5

## 2011-10-27 MED ORDER — PROPOFOL 10 MG/ML IV BOLUS
INTRAVENOUS | Status: DC | PRN
  Administered 2011-10-27: 25 mg via INTRAVENOUS

## 2011-10-27 MED ORDER — DOCUSATE SODIUM 100 MG PO CAPS
100.0000 mg | ORAL_CAPSULE | Freq: Two times a day (BID) | ORAL | Status: DC
  Administered 2011-10-27 – 2011-10-30 (×6): 100 mg via ORAL
  Filled 2011-10-27 (×7): qty 1

## 2011-10-27 MED ORDER — TEMAZEPAM 15 MG PO CAPS: 15.0000 mg | ORAL_CAPSULE | Freq: Every evening | ORAL | Status: DC | PRN

## 2011-10-27 MED ORDER — BISOPROLOL FUMARATE 5 MG PO TABS
5.0000 mg | ORAL_TABLET | Freq: Once | ORAL | Status: AC
  Administered 2011-10-27: 5 mg via ORAL
  Filled 2011-10-27: qty 1

## 2011-10-27 MED ORDER — BUPIVACAINE 0.25 % ON-Q PUMP SINGLE CATH 300ML
INJECTION | Status: AC
  Filled 2011-10-27: qty 300

## 2011-10-27 MED ORDER — BUPIVACAINE IN DEXTROSE 0.75-8.25 % IT SOLN
INTRATHECAL | Status: DC | PRN
  Administered 2011-10-27: 1.8 mL via INTRATHECAL

## 2011-10-27 MED ORDER — METOCLOPRAMIDE HCL 10 MG PO TABS: 5.0000 mg | ORAL_TABLET | Freq: Three times a day (TID) | ORAL | Status: DC | PRN

## 2011-10-27 MED ORDER — HYDROMORPHONE 0.3 MG/ML IV SOLN
INTRAVENOUS | Status: AC
  Filled 2011-10-27: qty 25

## 2011-10-27 MED ORDER — MENTHOL 3 MG MT LOZG
1.0000 | LOZENGE | OROMUCOSAL | Status: DC | PRN
  Filled 2011-10-27: qty 9

## 2011-10-27 MED ORDER — POLYETHYLENE GLYCOL 3350 17 G PO PACK
17.0000 g | PACK | Freq: Every day | ORAL | Status: DC | PRN
  Filled 2011-10-27: qty 1

## 2011-10-27 MED ORDER — MIDAZOLAM HCL 5 MG/5ML IJ SOLN
INTRAMUSCULAR | Status: DC | PRN
  Administered 2011-10-27: 2 mg via INTRAVENOUS

## 2011-10-27 MED ORDER — BUPIVACAINE ON-Q PAIN PUMP (FOR ORDER SET NO CHG)
INJECTION | Status: DC
  Filled 2011-10-27: qty 1

## 2011-10-27 MED ORDER — ACETAMINOPHEN 325 MG PO TABS: 650.0000 mg | ORAL_TABLET | Freq: Four times a day (QID) | ORAL | Status: DC | PRN

## 2011-10-27 MED ORDER — BISOPROLOL-HYDROCHLOROTHIAZIDE 5-6.25 MG PO TABS
0.5000 | ORAL_TABLET | Freq: Every day | ORAL | Status: DC
  Administered 2011-10-27 – 2011-10-30 (×4): 0.5 via ORAL
  Filled 2011-10-27 (×4): qty 0.5

## 2011-10-27 MED ORDER — IRBESARTAN 300 MG PO TABS
300.0000 mg | ORAL_TABLET | Freq: Every day | ORAL | Status: DC
  Administered 2011-10-27: 300 mg via ORAL
  Filled 2011-10-27 (×2): qty 1

## 2011-10-27 MED ORDER — AMLODIPINE BESYLATE 5 MG PO TABS
15.0000 mg | ORAL_TABLET | Freq: Every day | ORAL | Status: DC
  Administered 2011-10-28 – 2011-10-30 (×3): 15 mg via ORAL
  Filled 2011-10-27 (×3): qty 1

## 2011-10-27 MED ORDER — IMIQUIMOD 5 % EX CREA: 1.0000 "application " | TOPICAL_CREAM | Freq: Two times a day (BID) | CUTANEOUS | Status: DC | PRN

## 2011-10-27 MED ORDER — FENTANYL CITRATE 0.05 MG/ML IJ SOLN
INTRAMUSCULAR | Status: DC | PRN
  Administered 2011-10-27: 50 ug via INTRAVENOUS

## 2011-10-27 MED ORDER — ONDANSETRON HCL 4 MG PO TABS: 4.0000 mg | ORAL_TABLET | Freq: Four times a day (QID) | ORAL | Status: DC | PRN

## 2011-10-27 MED ORDER — MORPHINE SULFATE (PF) 1 MG/ML IV SOLN
INTRAVENOUS | Status: DC
  Administered 2011-10-27: 16:00:00 via INTRAVENOUS
  Filled 2011-10-27: qty 25

## 2011-10-27 MED ORDER — BUPIVACAINE 0.25 % ON-Q PUMP SINGLE CATH 300ML
300.0000 mL | INJECTION | Status: DC
  Administered 2011-10-27: 300 mL

## 2011-10-27 MED ORDER — HYDROMORPHONE 0.3 MG/ML IV SOLN: INTRAVENOUS | Status: DC

## 2011-10-27 MED ORDER — BISACODYL 10 MG RE SUPP: 10.0000 mg | Freq: Every day | RECTAL | Status: DC | PRN

## 2011-10-27 MED ORDER — SODIUM CHLORIDE 0.9 % IR SOLN
Status: DC | PRN
  Administered 2011-10-27: 1000 mL

## 2011-10-27 SURGICAL SUPPLY — 56 items
BAG SPEC THK2 15X12 ZIP CLS (MISCELLANEOUS) ×1
BAG ZIPLOCK 12X15 (MISCELLANEOUS) ×2 IMPLANT
BANDAGE ELASTIC 6 VELCRO ST LF (GAUZE/BANDAGES/DRESSINGS) ×2 IMPLANT
BANDAGE ESMARK 6X9 LF (GAUZE/BANDAGES/DRESSINGS) ×1 IMPLANT
BLADE SAG 18X100X1.27 (BLADE) ×2 IMPLANT
BLADE SAW SGTL 11.0X1.19X90.0M (BLADE) ×2 IMPLANT
BNDG CMPR 9X6 STRL LF SNTH (GAUZE/BANDAGES/DRESSINGS) ×1
BNDG ESMARK 6X9 LF (GAUZE/BANDAGES/DRESSINGS) ×2
BOWL SMART MIX CTS (DISPOSABLE) ×2 IMPLANT
CATH KIT ON-Q SILVERSOAK 5IN (CATHETERS) ×2 IMPLANT
CEMENT HV SMART SET (Cement) ×3 IMPLANT
CLOSURE STERI STRIP 1/2 X4 (GAUZE/BANDAGES/DRESSINGS) ×1 IMPLANT
CLOTH BEACON ORANGE TIMEOUT ST (SAFETY) ×2 IMPLANT
CUFF TOURN SGL QUICK 34 (TOURNIQUET CUFF) ×2
CUFF TRNQT CYL 34X4X40X1 (TOURNIQUET CUFF) ×1 IMPLANT
DRAPE EXTREMITY T 121X128X90 (DRAPE) ×2 IMPLANT
DRAPE POUCH INSTRU U-SHP 10X18 (DRAPES) ×2 IMPLANT
DRAPE U-SHAPE 47X51 STRL (DRAPES) ×2 IMPLANT
DRSG ADAPTIC 3X8 NADH LF (GAUZE/BANDAGES/DRESSINGS) ×2 IMPLANT
DRSG EMULSION OIL 3X16 NADH (GAUZE/BANDAGES/DRESSINGS) ×1 IMPLANT
DRSG PAD ABDOMINAL 8X10 ST (GAUZE/BANDAGES/DRESSINGS) ×1 IMPLANT
DURAPREP 26ML APPLICATOR (WOUND CARE) ×2 IMPLANT
ELECT REM PT RETURN 9FT ADLT (ELECTROSURGICAL) ×2
ELECTRODE REM PT RTRN 9FT ADLT (ELECTROSURGICAL) ×1 IMPLANT
EVACUATOR 1/8 PVC DRAIN (DRAIN) ×2 IMPLANT
FACESHIELD LNG OPTICON STERILE (SAFETY) ×10 IMPLANT
GAUZE SPONGE 4X4 12PLY STRL LF (GAUZE/BANDAGES/DRESSINGS) ×2 IMPLANT
GLOVE BIO SURGEON STRL SZ7.5 (GLOVE) ×2 IMPLANT
GLOVE BIO SURGEON STRL SZ8 (GLOVE) ×2 IMPLANT
GLOVE BIOGEL PI IND STRL 8 (GLOVE) ×2 IMPLANT
GLOVE BIOGEL PI INDICATOR 8 (GLOVE) ×2
GOWN STRL NON-REIN LRG LVL3 (GOWN DISPOSABLE) ×2 IMPLANT
GOWN STRL REIN XL XLG (GOWN DISPOSABLE) ×2 IMPLANT
HANDPIECE INTERPULSE COAX TIP (DISPOSABLE) ×2
IMMOBILIZER KNEE 20 (SOFTGOODS) ×2
IMMOBILIZER KNEE 20 THIGH 36 (SOFTGOODS) ×1 IMPLANT
KIT BASIN OR (CUSTOM PROCEDURE TRAY) ×2 IMPLANT
MANIFOLD NEPTUNE II (INSTRUMENTS) ×2 IMPLANT
NS IRRIG 1000ML POUR BTL (IV SOLUTION) ×2 IMPLANT
PACK TOTAL JOINT (CUSTOM PROCEDURE TRAY) ×2 IMPLANT
PAD ABD 7.5X8 STRL (GAUZE/BANDAGES/DRESSINGS) ×2 IMPLANT
PADDING CAST COTTON 6X4 STRL (CAST SUPPLIES) ×6 IMPLANT
PADDING WEBRIL 6 STERILE (GAUZE/BANDAGES/DRESSINGS) ×1 IMPLANT
POSITIONER SURGICAL ARM (MISCELLANEOUS) ×2 IMPLANT
SET HNDPC FAN SPRY TIP SCT (DISPOSABLE) ×1 IMPLANT
SPONGE GAUZE 4X4 12PLY (GAUZE/BANDAGES/DRESSINGS) ×2 IMPLANT
STRIP CLOSURE SKIN 1/2X4 (GAUZE/BANDAGES/DRESSINGS) ×4 IMPLANT
SUCTION FRAZIER 12FR DISP (SUCTIONS) ×2 IMPLANT
SUT MNCRL AB 4-0 PS2 18 (SUTURE) ×2 IMPLANT
SUT PDS AB 1 CT1 27 (SUTURE) ×6 IMPLANT
SUT VIC AB 2-0 CT1 27 (SUTURE) ×6
SUT VIC AB 2-0 CT1 TAPERPNT 27 (SUTURE) ×3 IMPLANT
TOWEL OR 17X26 10 PK STRL BLUE (TOWEL DISPOSABLE) ×4 IMPLANT
TRAY FOLEY CATH 14FRSI W/METER (CATHETERS) ×2 IMPLANT
WATER STERILE IRR 1500ML POUR (IV SOLUTION) ×2 IMPLANT
WRAP KNEE MAXI GEL POST OP (GAUZE/BANDAGES/DRESSINGS) ×4 IMPLANT

## 2011-10-27 NOTE — Anesthesia Preprocedure Evaluation (Addendum)
Anesthesia Evaluation  Patient identified by MRN, date of birth, ID band Patient awake    Reviewed: Allergy & Precautions, H&P , NPO status , Patient's Chart, lab work & pertinent test results  History of Anesthesia Complications (+) PONV  Airway Mallampati: III TM Distance: >3 FB Neck ROM: Full    Dental No notable dental hx.    Pulmonary neg pulmonary ROS,  clear to auscultation  Pulmonary exam normal       Cardiovascular hypertension, Pt. on medications + CAD (s/p CABG 2000), + Past MI and neg cardio ROS Regular Normal    Neuro/Psych Negative Neurological ROS  Negative Psych ROS   GI/Hepatic negative GI ROS, Neg liver ROS, hiatal hernia,   Endo/Other  Negative Endocrine ROS  Renal/GU negative Renal ROS  Genitourinary negative   Musculoskeletal negative musculoskeletal ROS (+)   Abdominal   Peds negative pediatric ROS (+)  Hematology negative hematology ROS (+)   Anesthesia Other Findings   Reproductive/Obstetrics negative OB ROS                          Anesthesia Physical Anesthesia Plan  ASA: III  Anesthesia Plan: Spinal   Post-op Pain Management:    Induction:   Airway Management Planned:   Additional Equipment:   Intra-op Plan:   Post-operative Plan:   Informed Consent: I have reviewed the patients History and Physical, chart, labs and discussed the procedure including the risks, benefits and alternatives for the proposed anesthesia with the patient or authorized representative who has indicated his/her understanding and acceptance.   Dental advisory given  Plan Discussed with:   Anesthesia Plan Comments:         Anesthesia Quick Evaluation

## 2011-10-27 NOTE — Transfer of Care (Signed)
Immediate Anesthesia Transfer of Care Note  Patient: Hayden [name]  Procedure(s) Performed: Procedure(s) (LRB): TOTAL KNEE ARTHROPLASTY (Right)  Patient Location: PACU  Anesthesia Type: Spinal  Level of Consciousness: awake, alert  and oriented  Airway & Oxygen Therapy: Patient Spontanous Breathing  Post-op Assessment: Report given to PACU RN and Post -op Vital signs reviewed and stable  Post vital signs: Reviewed and stable  Complications: No apparent anesthesia complications

## 2011-10-27 NOTE — Interval H&P Note (Signed)
History and Physical Interval Note:  10/27/2011 1:37 PM  Hayden [name]  has presented today for surgery, with the diagnosis of osteoarthritis right knee  The various methods of treatment have been discussed with the patient and family. After consideration of risks, benefits and other options for treatment, the patient has consented to  Procedure(s) (LRB): TOTAL KNEE ARTHROPLASTY (Right) as a surgical intervention .  The patients' history has been reviewed, patient examined, no change in status, stable for surgery.  I have reviewed the patients' chart and labs.  Questions were answered to the patient's satisfaction.     [name]

## 2011-10-27 NOTE — Op Note (Signed)
Pre-operative diagnosis- Osteoarthritis  Right knee(s)  Post-operative diagnosis- Osteoarthritis Right knee(s)  Procedure-  Right  Total Knee Arthroplasty  Surgeon- Gus Rankin. , MD  Assistant- Avel Peace, PA-C   Anesthesia-  Spinal EBL-* No blood loss amount entered *  Drains Hemovac  Tourniquet time- 35 minutes @ 300 mmHg Complications- None  Condition-PACU - hemodynamically stable.   Brief Clinical Note  Hayden [name] is a 66 y.o. year old male with end stage OA of his right knee with progressively worsening pain and dysfunction. He has constant pain, with activity and at rest and significant functional deficits with difficulties even with ADLs. He has had extensive non-op management including analgesics, injections of cortisone, and home exercise program, but remains in significant pain with significant dysfunction. He has severe patellofemoral OA with significant erosion of the patellar thickness. He presents now for right Total Knee Arthroplasty.    Procedure in detail---   The patient is brought into the operating room and positioned supine on the operating table. After successful administration of  Spinal,   a tourniquet is placed high on the  Right thigh(s) and the lower extremity is prepped and draped in the usual sterile fashion. Time out is performed by the operating team and then the  Right lower extremity is wrapped in Esmarch, knee flexed and the tourniquet inflated to 300 mmHg.       A midline incision is made with a ten blade through the subcutaneous tissue to the level of the extensor mechanism. A fresh blade is used to make a medial parapatellar arthrotomy. Soft tissue over the proximal medial tibia is subperiosteally elevated to the joint line with a knife and into the semimembranosus bursa with a Cobb elevator. Soft tissue over the proximal lateral tibia is elevated with attention being paid to avoiding the patellar tendon on the tibial tubercle. The patella is  everted, knee flexed 90 degrees and the ACL and PCL are removed. Findings are bone on bone patella and trochlea with severe erosions of both and large osteophytes.        The drill is used to create a starting hole in the distal femur and the canal is thoroughly irrigated with sterile saline to remove the fatty contents. The 5 degree Right  valgus alignment guide is placed into the femoral canal and the distal femoral cutting block is pinned to remove 10 mm off the distal femur. Resection is made with an oscillating saw.      The tibia is subluxed forward and the menisci are removed. The extramedullary alignment guide is placed referencing proximally at the medial aspect of the tibial tubercle and distally along the second metatarsal axis and tibial crest. The block is pinned to remove 2mm off the more deficient medial  side. Resection is made with an oscillating saw. Size 3is the most appropriate size for the tibia and the proximal tibia is prepared with the modular drill and keel punch for that size.      The femoral sizing guide is placed and size 3 is most appropriate. Rotation is marked off the epicondylar axis and confirmed by creating a rectangular flexion gap at 90 degrees. The size 3 cutting block is pinned in this rotation and the anterior, posterior and chamfer cuts are made with the oscillating saw. The intercondylar block is then placed and that cut is made.      Trial size 3 tibial component, trial size 3 posterior stabilized femur and a 12.5  mm posterior  stabilized rotating platform insert trial is placed. Full extension is achieved with excellent varus/valgus and anterior/posterior balance throughout full range of motion. The patella is everted and thickness measured to be 18  mm. Free hand resection is taken to 12 mm, a 35 template is placed, lug holes are drilled, trial patella is placed, and it tracks normally. Osteophytes are removed off the posterior femur with the trial in place. All  trials are removed and the cut bone surfaces prepared with pulsatile lavage. Cement is mixed and once ready for implantation, the size 3 tibial implant, size  3 posterior stabilized femoral component, and the size 35 patella are cemented in place and the patella is held with the clamp. The trial insert is placed and the knee held in full extension. All extruded cement is removed and once the cement is hard the permanent 12.5 mm posterior stabilized rotating platform insert is placed into the tibial tray.      The wound is copiously irrigated with saline solution and the extensor mechanism closed over a hemovac drain with #1 PDS suture. The tourniquet is released for a total tourniquet time of 35  minutes. Flexion against gravity is 140 degrees and the patella tracks normally. Subcutaneous tissue is closed with 2.0 vicryl and subcuticular with running 4.0 Monocryl. The catheter for the Marcaine pain pump is placed and the pump is initiated. The incision is cleaned and dried and steri-strips and a bulky sterile dressing are applied. The limb is placed into a knee immobilizer and the patient is awakened and transported to recovery in stable condition.      Please note that a surgical assistant was a medical necessity for this procedure in order to perform it in a safe and expeditious manner. Surgical assistant was necessary to retract the ligaments and vital neurovascular structures to prevent injury to them and also necessary for proper positioning of the limb to allow for anatomic placement of the prosthesis.   Gus Rankin , MD    10/27/2011, 3:02 PM

## 2011-10-27 NOTE — Anesthesia Postprocedure Evaluation (Signed)
  Anesthesia Post-op Note  Patient: Hayden [name]  Procedure(s) Performed: Procedure(s) (LRB): TOTAL KNEE ARTHROPLASTY (Right)  Patient Location: PACU  Anesthesia Type: Spinal  Level of Consciousness: awake and alert   Airway and Oxygen Therapy: Patient Spontanous Breathing  Post-op Pain: mild  Post-op Assessment: Post-op Vital signs reviewed, Patient's Cardiovascular Status Stable, Respiratory Function Stable, Patent Airway and No signs of Nausea or vomiting  Post-op Vital Signs: stable  Complications: No apparent anesthesia complications

## 2011-10-27 NOTE — H&P (View-Only) (Signed)
Hayden [name]  DOB: 09/22/1966 Married / Language: English / Race: White / Male  Date of Admission:  10/25/2011  Chief Complaint:  Right Knee Pain  History of Present Illness The patient is a 66 year old male who comes in today for a preoperative History and Physical. The patient is scheduled for a right total knee arthroplasty to be performed by Dr. Frank V. Aluisio, MD at [facility] on 10/27/2011. The patient is a 66 year old male who presents with knee complaints. The patient was seen in referral from Dr. Norris. The patient reports left knee and right knee symptoms. and include knee pain, swelling and locking. Onset of symptoms was gradual. Note for "Knee pain": Ready for surgery. Had cortisone injections done in the past. Injections dont help as well anymore. Takes advil for knee pain.  [name] states that the knees are bothering him at all times. The right is worse than the left. Both knees hurt. The right knee is more symptomatic. This is starting to limit what he can and can not do. He has had cortisone injections in the past. Initially, this was with good benefit but the most recent injections did not help. Visco supplements also have not helped. He feels the knee does want to give out on him at times. He is ready to proceed with surgery. They have been treated conservatively in the past for the above stated problem and despite conservative measures, they continue to have progressive pain and severe functional limitations and dysfunction. They have failed non-operative management including home exercise, medications, and injections. It is felt that they would benefit from undergoing total joint replacement. Risks and benefits of the procedure have been discussed with the patient and they elect to proceed with surgery. There are no active contraindications to surgery such as ongoing infection or rapidly progressive neurological disease.  Allergies SULFA. 06/18/2008  webbing of hands and feet CILLINS. Penicillian and derivates caused sickness. NSAIDs  Medication History TraMADol HCl (50MG Tablet, Oral as needed) Active. Percocet (5-325MG Tablet, 1 Oral TAKE 1-2 TAB PO Q4-6HR PRN PAIN, Taken starting 10/06/2011) Active. Advil ( Oral) Specific dose unknown - Active. ((prn for knees)) Ziac ( Oral) Specific dose unknown - Active. Benicar ( Oral) Specific dose unknown - Active. Norvasc ( Oral) Specific dose unknown - Active. Vytorin ( Oral) Specific dose unknown - Active. Cymbalta ( Oral) Specific dose unknown - Active. Aspirin Childrens ( Oral) Specific dose unknown - Active. Vitamin D ( Oral) Specific dose unknown - Active.  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 Has been on chronic pain meds Coronary artery disease Depression High blood pressure Irritable bowel syndrome Kidney Stone. Multiple Stones Myocardial infarction. March 4,1991 Skin Cancer Hiatal Hernia  Past Surgical History Arthroscopic Shoulder Surgery - Left Low Back Disc Surgery hernia Coronary Artery Bypass, Three. Date: 10/1998. lithotripsy Arthroscopy of Knee. right Arthroscopy of Shoulder. left  Family History Cancer. mother and father Diabetes Mellitus. child Drug / Alcohol Addiction. mother and father Heart Disease. mother and father Hypertension. mother and father Father. Cancer. age 61 Mother. Cancer. age 78  Social History No alcohol use Alcohol use. current drinker; drinks beer; only occasionally per week Children. 2 Current work status. working full time Exercise. Exercises weekly; does running / walking and individual sport Living situation. live with spouse Marital status. married Illicit drug use. no Number of flights of stairs before winded. 2-3 Tobacco use. Never smoker. never   smoker Post-Surgical Plans. Plan is to look into [facility] Advance Directives. Living Will, Healthcare POA  Review of Systems General:Not Present- Chills, Fever, Night Sweats, Fatigue, Weight Gain, Weight Loss and Memory Loss. Skin:Not Present- Hives, Itching, Rash, Eczema and Lesions. HEENT:Not Present- Tinnitus, Headache, Double Vision, Visual Loss, Hearing Loss and Dentures. Respiratory:Not Present- Shortness of breath with exertion, Shortness of breath at rest, Allergies, Coughing up blood and Chronic Cough. Cardiovascular:Not Present- Chest Pain, Racing/skipping heartbeats, Difficulty Breathing Lying Down, Murmur, Swelling and Palpitations. Gastrointestinal:Not Present- Bloody Stool, Heartburn, Abdominal Pain, Vomiting, Nausea, Constipation, Diarrhea, Difficulty Swallowing, Jaundice and Loss of appetitie. Male Genitourinary:Present- Urinating at Night. Not Present- Urinary frequency, Blood in Urine, Weak urinary stream, Discharge, Flank Pain, Incontinence, Painful Urination, Urgency and Urinary Retention. Musculoskeletal:Present- Joint Pain and Back Pain. Not Present- Muscle Weakness, Muscle Pain, Joint Swelling, Morning Stiffness and Spasms. Neurological:Not Present- Tremor, Dizziness, Blackout spells, Paralysis, Difficulty with balance and Weakness. Psychiatric:Not Present- Insomnia.  Vitals Weight: 179 lb Height: 68 in Body Surface Area: 1.97 m Body Mass Index: 27.22 kg/m Pulse: 88 (Regular) Resp.: 16 (Unlabored) BP: 124/68 (Sitting, Left Arm, Standard)  Physical Exam Patient is a 66 year old male with continued knee pain.  General Mental Status - Alert, cooperative and good historian. General Appearance- pleasant. Not in acute distress. Orientation- Oriented X3. Build & Nutrition- Well nourished and Well developed.  Head and Neck Head- normocephalic, atraumatic . Neck Global Assessment- supple. no bruit auscultated on the right and no bruit auscultated on the left. partial upper plate bilateral  hearing aids  ENT Pupil- Bilateral- Regular and Round. Motion- Bilateral- EOMI. wears glasses  Chest and Lung Exam Auscultation: Breath sounds:- clear at anterior chest wall and - clear at posterior chest wall. Adventitious sounds:- No Adventitious sounds.  Cardiovascular Auscultation:Rhythm- Regular rate and rhythm. Heart Sounds- S1 WNL and S2 WNL. Murmurs & Other Heart Sounds:Auscultation of the heart reveals - No Murmurs.  Abdomen Palpation/Percussion:Tenderness- Abdomen is non-tender to palpation. Rigidity (guarding)- Abdomen is soft. Auscultation:Auscultation of the abdomen reveals - Bowel sounds normal.  Male Genitourinary Not done, not pertinent to present illness  Musculoskeletal He is a well developed male. He is alert and oriented. He is in no apparent distress. Both hips show normal motion. No discomfort. His right knee shows no effusion. There is marked crepitus on range of motion and in fact there is actually "squeaking" of the bone surfaces rubbing against each other. His range is about 5-125. There is no instability about the knee. The left knee with no effusion. There is moderate crepitus on range of motion. He is tender medial greater than lateral. There is no instability. Range is 5-125 degrees. Pulse, sensation and motor are intact.  RADIOGRAPHS: AP of both knees and lateral show a severe erosive patellofemoral arthritis of the right knee. There is some spurring in the medial and lateral compartments but no joint space loss. His arthritis is horrible with erosion of the patella. The left knee has patellofemoral arthritis that is also advanced but to a lesser degree than the right.  Assessment & Plan Osteoarthritis Right Knee  Patient is for a Right Total Knee Replacement by Dr. Aluisio.  Plan is to go to [facility] after the surgery.  PCP - Dr. Candance Smith Cards - Dr. Traci Turner  Drew , PA-C   

## 2011-10-27 NOTE — Anesthesia Procedure Notes (Signed)
Spinal Patient location during procedure: OR Staffing Anesthesiologist: ,  Performed by: anesthesiologist  Preanesthetic Checklist Completed: patient identified, site marked, surgical consent, pre-op evaluation, timeout performed, IV checked, risks and benefits discussed and monitors and equipment checked Spinal Block Patient position: sitting Prep: Betadine Patient monitoring: heart rate, continuous pulse ox and blood pressure Approach: midline Location: L2-3 Injection technique: single-shot Needle Needle type: Spinocan  Needle gauge: 22 G Needle length: 9 cm Additional Notes Expiration date of kit checked and confirmed. Patient tolerated procedure well, without complications.     

## 2011-10-27 NOTE — Progress Notes (Signed)
Patient reported pain level 9-10 when Dr. Lequita Halt was at the bedside at 2000.  Received verbal order to change PCA to low-dose Dilaudid.  Patient was also given Robaxin, tylenol, and oxycodone as ordered. Patient was repositioned and ice was applied.  At 2315 patient reporting pain had not decreased.  Dr. Victorino Dike called, new orders received, PCA dilaudid increased to high dose.  Neurovascular status in right light extrimitry WNL.  Patient is on continuous pulse oximetry, Will continue to monitor.

## 2011-10-28 DIAGNOSIS — E871 Hypo-osmolality and hyponatremia: Secondary | ICD-10-CM | POA: Diagnosis not present

## 2011-10-28 LAB — BASIC METABOLIC PANEL
BUN: 6 mg/dL (ref 6–23)
Chloride: 96 mEq/L (ref 96–112)
GFR calc Af Amer: >90 mL/min (ref >90)
Potassium: 4 mEq/L (ref 3.5–5.1)

## 2011-10-28 LAB — CBC
HCT: 33.7 % — ABNORMAL LOW (ref 39.0–52.0)
Hemoglobin: 11.1 g/dL — ABNORMAL LOW (ref 13.0–17.0)
RDW: 12.5 % (ref 11.5–15.5)
WBC: 8.2 10*3/uL (ref 4.0–10.5)

## 2011-10-28 MED ORDER — HYDROMORPHONE 0.3 MG/ML IV SOLN
INTRAVENOUS | Status: AC
  Administered 2011-10-28: 4.6 mg via INTRAVENOUS
  Filled 2011-10-28: qty 25

## 2011-10-28 MED ORDER — DIAZEPAM 5 MG PO TABS
5.0000 mg | ORAL_TABLET | Freq: Four times a day (QID) | ORAL | Status: DC | PRN
  Administered 2011-10-28 (×2): 5 mg via ORAL
  Filled 2011-10-28 (×3): qty 1

## 2011-10-28 MED ORDER — IRBESARTAN 300 MG PO TABS
300.0000 mg | ORAL_TABLET | Freq: Every day | ORAL | Status: DC
  Administered 2011-10-28 – 2011-10-29 (×2): 300 mg via ORAL
  Filled 2011-10-28 (×3): qty 1

## 2011-10-28 MED ORDER — HYDROMORPHONE HCL PF 1 MG/ML IJ SOLN
0.5000 mg | INTRAMUSCULAR | Status: DC | PRN
  Administered 2011-10-28 – 2011-10-29 (×4): 1 mg via INTRAVENOUS
  Filled 2011-10-28 (×4): qty 1

## 2011-10-28 MED ORDER — BISACODYL 10 MG RE SUPP: 10.0000 mg | Freq: Every day | RECTAL | Status: DC | PRN

## 2011-10-28 MED ORDER — POLYETHYLENE GLYCOL 3350 17 G PO PACK
17.0000 g | PACK | Freq: Every day | ORAL | Status: DC | PRN
  Filled 2011-10-28: qty 1

## 2011-10-28 NOTE — Progress Notes (Signed)
Subjective: 1 Day Post-Op Procedure(s) (LRB): TOTAL KNEE ARTHROPLASTY (Right) Patient reports pain as mild and moderate last night Patient has complaints of pain but little better this morning. We will start therapy today. Plan is to go home after hospital stay.  Objective: Vital signs in last 24 hours: Temp:  [97.3 F (36.3 C)-98.5 F (36.9 C)] 97.8 F (36.6 C) (02/28 0526) Pulse Rate:  [77-108] 96  (02/28 0526) Resp:  [10-21] 12  (02/28 0526) BP: (116-157)/(72-84) 157/77 mmHg (02/28 0815) SpO2:  [93 %-99 %] 93 % (02/28 0526) Weight:  [83.008 kg (183 lb)] 83.008 kg (183 lb) (02/27 1645)  Intake/Output from previous day:  Intake/Output Summary (Last 24 hours) at 10/28/11 0938 Last data filed at 10/28/11 0806  Gross per 24 hour  Intake 4668.33 ml  Output   1885 ml  Net 2783.33 ml    Intake/Output this shift: Total I/O In: 240 [P.O.:240] Out: -   Labs:  Basename 10/28/11 0436  HGB 11.1*    Basename 10/28/11 0436  WBC 8.2  RBC 3.52*  HCT 33.7*  PLT 230    Basename 10/28/11 0436  NA 132*  K 4.0  CL 96  CO2 31  BUN 6  CREATININE 0.80  GLUCOSE 118*  CALCIUM 8.4   No results found for this basename: LABPT:2,INR:2 in the last 72 hours  Exam - Neurovascular intact Sensation intact distally Dressing - clean, dry, no drainage Motor function intact - moving foot and toes well on exam.  Hemovac pulled without difficulty.  Past Medical History  Diagnosis Date  . PONV (postoperative nausea and vomiting)     pt prefers epidural is possible  . Impaired hearing     both ears  . Coronary artery disease   . Hypertension   . Myocardial infarction 10-31-1989  . H/O hiatal hernia   . Arthritis   . Bright's disease age 66  . Kidney stones     100  in past plus stones  . Frequent urination     Assessment/Plan: 1 Day Post-Op Procedure(s) (LRB): TOTAL KNEE ARTHROPLASTY (Right) Principal Problem:  *OA (osteoarthritis) of knee   Advance diet Up with  therapy Continue foley due to strict I&O and urinary output monitoring Discharge home with home health  DVT Prophylaxis - Xarelto  Protocol Weight-Bearing as tolerated to righ leg Keep foley until tomorrow. No vaccines. D/C PCA, Change to IV push D/C O2 and Pulse OX and try on Room [address]  Hayden [name] 10/28/2011, 9:38 AM

## 2011-10-28 NOTE — Progress Notes (Signed)
CSW met with pt/ spouse this am to assist with d/c planning. Pt had hoped to have ST rehab at [facility]. SNF is out of network with pt's insurance. Pt has decided to d/c home with [facility] services. CSW is available to assist with SNF placement if plan changes.

## 2011-10-28 NOTE — Evaluation (Signed)
Physical Therapy Evaluation Patient Details Name: Hayden [name] MRN: [id number] DOB: [birth year] Today's Date: 10/28/2011  Problem List:  Patient Active Problem List  Diagnoses  . OA (osteoarthritis) of knee  . Postop Hyponatremia    Past Medical History:  Past Medical History  Diagnosis Date  . PONV (postoperative nausea and vomiting)     pt prefers epidural is possible  . Impaired hearing     both ears  . Coronary artery disease   . Hypertension   . Myocardial infarction 10-31-1989  . H/O hiatal hernia   . Arthritis   . Bright's disease age 12  . Kidney stones     100  in past plus stones  . Frequent urination    Past Surgical History:  Past Surgical History  Procedure Date  . Coronary artery bypass graft 10-2998    cabg x 3  . Nissen fundoplication 2006  . Lithortripsy     x 3  . Cystoscopy     10 to 12 times in past  . Lumbar laminectomy L 3 to L 4    Jul 01 1995  . Hernia repair 1998    with mesh  . Bilateral knee arthroscopy yrs ago  . Angioplasty 1991  . Cardiac catheterization 01-17-07    PT Assessment/Plan/Recommendation PT Assessment Clinical Impression Statement: Pt with R TKR presents with limited R LE strength/ROM, decreased functional mobility, and increased pain level.  Pt will benefit from skilled PT intervention to maximize IND for d/c home. PT Recommendation/Assessment: Patient will need skilled PT in the acute care venue PT Problem List: Decreased strength;Decreased range of motion;Decreased activity tolerance;Decreased mobility;Decreased knowledge of use of DME;Pain PT Therapy Diagnosis : Difficulty walking PT Plan PT Frequency: 7X/week PT Treatment/Interventions: DME instruction;Gait training;Stair training;Functional mobility training;Therapeutic activities;Therapeutic exercise;Patient/family education PT Recommendation Recommendations for Other Services: OT consult Follow Up Recommendations: Home health PT Equipment Recommended: Rolling  walker with 5" wheels PT Goals  Acute Rehab PT Goals PT Goal Formulation: With patient Time For Goal Achievement: 7 days Pt will go Supine/Side to Sit: with supervision PT Goal: Supine/Side to Sit - Progress: Goal set today Pt will go Sit to Supine/Side: with supervision PT Goal: Sit to Supine/Side - Progress: Goal set today Pt will go Sit to Stand: with supervision PT Goal: Sit to Stand - Progress: Goal set today Pt will go Stand to Sit: with supervision PT Goal: Stand to Sit - Progress: Goal set today Pt will Ambulate: 51 - 150 feet;with supervision;with rolling walker PT Goal: Ambulate - Progress: Goal set today Pt will Go Up / Down Stairs: 1-2 stairs;with min assist;with least restrictive assistive device PT Goal: Up/Down Stairs - Progress: Goal set today Pt will Perform Home Exercise Program: with min assist PT Goal: Perform Home Exercise Program - Progress: Goal set today  PT Evaluation Precautions/Restrictions  Precautions Precautions: Knee Required Braces or Orthoses: Yes Knee Immobilizer: Discontinue once straight leg raise with < 10 degree lag Restrictions Weight Bearing Restrictions: No Other Position/Activity Restrictions: WBAT Prior Functioning  Home Living Lives With: Spouse Receives Help From: Family Type of Home: House Home Layout: One level Home Access: Stairs to enter Entrance Stairs-Rails: None Entrance Stairs-Number of Steps: 2 Prior Function Level of Independence: Independent with basic ADLs;Independent with gait;Independent with transfers;Requires assistive device for independence Able to Take Stairs?: Yes Cognition Cognition Arousal/Alertness: Awake/alert Overall Cognitive Status: Appears within functional limits for tasks assessed Orientation Level: Oriented X4 Sensation/Coordination Coordination Gross Motor Movements are Fluid and Coordinated: Yes  Extremity Assessment RUE Assessment RUE Assessment: Within Functional Limits LUE  Assessment LUE Assessment: Within Functional Limits RLE Assessment RLE Assessment: Exceptions to [facility] (ROM not tested 2nd pain level, quads ~2/5) LLE Assessment LLE Assessment: Within Functional Limits Mobility (including Balance) Bed Mobility Bed Mobility: Yes Supine to Sit: 4: Min assist Supine to Sit Details (indicate cue type and reason): cues for sequence and to slow down Transfers Transfers: Yes Sit to Stand: 4: Min assist;3: Mod assist;With upper extremity assist;From bed Sit to Stand Details (indicate cue type and reason): cues for use of UEs and for LE position Stand to Sit: 4: Min assist;3: Mod assist;With upper extremity assist;With armrests;To chair/3-in-1 Stand to Sit Details: cues for use of UEs and for LE position Ambulation/Gait Ambulation/Gait: Yes Ambulation/Gait Assistance: 4: Min assist;3: Mod assist Ambulation/Gait Assistance Details (indicate cue type and reason): cues for sequence and position from RW Ambulation Distance (Feet): 24 Feet Assistive device: Rolling walker Gait Pattern: Step-to pattern    Exercise    End of Session PT - End of Session Equipment Utilized During Treatment: Right knee immobilizer Activity Tolerance: Patient limited by pain;Patient tolerated treatment well Patient left: in chair;with call bell in reach;with family/visitor present Nurse Communication: Mobility status for transfers;Mobility status for ambulation General Behavior During Session: Restless Cognition: [facility] for tasks performed  , 10/28/2011, 11:55 AM

## 2011-10-28 NOTE — Progress Notes (Signed)
PT NOTE:  Pm session deferred secondary to pt pain level.  RN aware and working with pt.  Will follow in am.

## 2011-10-29 ENCOUNTER — Encounter (HOSPITAL_COMMUNITY): Payer: Self-pay | Admitting: Orthopedic Surgery

## 2011-10-29 LAB — BASIC METABOLIC PANEL
BUN: 5 mg/dL — ABNORMAL LOW (ref 6–23)
Calcium: 9.2 mg/dL (ref 8.4–10.5)
Creatinine, Ser: 0.78 mg/dL (ref 0.50–1.35)
GFR calc Af Amer: >90 mL/min (ref >90)

## 2011-10-29 LAB — CBC
HCT: 35.2 % — ABNORMAL LOW (ref 39.0–52.0)
MCH: 31.7 pg (ref 26.0–34.0)
MCV: 94.6 fL (ref 78.0–100.0)
Platelets: 200 10*3/uL (ref 150–400)
RDW: 12.4 % (ref 11.5–15.5)

## 2011-10-29 MED ORDER — OXYCODONE HCL 5 MG PO TABS: 5.0000 mg | ORAL_TABLET | ORAL | Status: AC | PRN

## 2011-10-29 MED ORDER — METHOCARBAMOL 500 MG PO TABS: 500.0000 mg | ORAL_TABLET | Freq: Four times a day (QID) | ORAL | Status: AC | PRN

## 2011-10-29 MED ORDER — METHOCARBAMOL 100 MG/ML IJ SOLN
1000.0000 mg | Freq: Four times a day (QID) | INTRAMUSCULAR | Status: DC | PRN
  Filled 2011-10-29: qty 10

## 2011-10-29 MED ORDER — METHOCARBAMOL 500 MG PO TABS
500.0000 mg | ORAL_TABLET | Freq: Four times a day (QID) | ORAL | Status: DC | PRN
  Administered 2011-10-29 (×2): 500 mg via ORAL
  Filled 2011-10-29 (×2): qty 1

## 2011-10-29 MED ORDER — RIVAROXABAN 10 MG PO TABS: 10.0000 mg | ORAL_TABLET | Freq: Every day | ORAL | Status: DC

## 2011-10-29 MED ORDER — ALPRAZOLAM 0.25 MG PO TABS
0.2500 mg | ORAL_TABLET | Freq: Three times a day (TID) | ORAL | Status: DC | PRN
  Administered 2011-10-29: 0.25 mg via ORAL
  Filled 2011-10-29: qty 1

## 2011-10-29 NOTE — Progress Notes (Signed)
Subjective: 2 Days Post-Op Procedure(s) (LRB): TOTAL KNEE ARTHROPLASTY (Right) Patient reports pain as mild and moderate.   Patient has complaints of pain and spasms.  His wife is int the room with him this morning.  He had moderate to severe pain last evening.  Better this morning but still hurting.  Positive for flatus but only scant movement.  Plan is to go home.  Maybe Saturday but maybe "Sunday depending upon progress.  Changed Robaxin to valium yesterday but not much difference.  Will switch back to Robaxin as to avoid to many side effects.  He states that he has been anxious diet to his pain levels. He has history of depression but no documented anxiety.  Will add Xanax low dose if needed.  Plan for this weekend.  Objective: Vital signs in last 24 hours: Temp:  [98.2 F (36.8 C)-99.1 F (37.3 C)] 99.1 F (37.3 C) (03/01 0615) Pulse Rate:  [87-119] 119  (03/01 0615) Resp:  [12-16] 16  (03/01 0615) BP: (141-171)/(72-91) 170/91 mmHg (03/01 0615) SpO2:  [93 %-95 %] 94 % (03/01 0615)  Intake/Output from previous day:  Intake/Output Summary (Last 24 hours) at 10/29/11 1009 Last data filed at 10/29/11 0900  Gross per 24 hour  Intake  844.5 ml  Output   3575 ml  Net -2730.5 ml    Intake/Output this shift: Total I/O In: 240 [P.O.:240] Out: 300 [Urine:300]  Labs:  Basename 10/29/11 0415 10/28/11 0436  HGB 11.8* 11.1*    Basename 10/29/11 0415 10/28/11 0436  WBC 9.1 8.2  RBC 3.72* 3.52*  HCT 35.2* 33.7*  PLT 200 230    Basename 10/29/11 0415 10/28/11 0436  NA 133* 132*  K 3.9 4.0  CL 95* 96  CO2 31 31  BUN 5* 6  CREATININE 0.78 0.80  GLUCOSE 135* 118*  CALCIUM 9.2 8.4   No results found for this basename: LABPT:2,INR:2 in the last 72 hours  Exam - Neurovascular intact Sensation intact distally Dressing/Incision - clean, dry, no drainage Motor function intact - moving foot and toes well on exam.   Past Medical History  Diagnosis Date  . PONV (postoperative  nausea and vomiting)     pt prefers epidural is possible  . Impaired hearing     both ears  . Coronary artery disease   . Hypertension   . Myocardial infarction 10-31-1989  . H/O hiatal hernia   . Arthritis   . Bright's disease age 5  . Kidney stones     10" 0  in past plus stones  . Frequent urination     Assessment/Plan: 2 Days Post-Op Procedure(s) (LRB): TOTAL KNEE ARTHROPLASTY (Right) Principal Problem:  *OA (osteoarthritis) of knee Active Problems:  Postop Hyponatremia   Advance diet Up with therapy D/C IV fluids Plan for discharge tomorrow Discharge home with home health  DVT Prophylaxis - Xarelto Protocol Weight-Bearing as tolerated to right leg  ,  10/29/2011, 10:09 AM

## 2011-10-29 NOTE — Discharge Instructions (Signed)
Knee Rehabilitation, Guidelines Following Surgery Results after knee surgery are often greatly improved when you follow the exercise, range of motion and muscle strengthening exercises prescribed by your doctor. Safety measures are also important to protect the knee from further injury. Any time any of these exercises cause you to have increased pain or swelling in your knee joint, decrease the amount until you are comfortable again and slowly increase them. If you have problems or questions, call your caregiver or physical therapist for advice. HOME CARE INSTRUCTIONS   Remove items at home which could result in a fall. This includes throw rugs or furniture in walking pathways.   Continue medications as instructed.   You may shower or take tub baths when your staples or stitches are removed or as instructed.   Walk using crutches or walker as instructed.   Put weight on your legs and walk as much as is comfortable.   You may resume a sexual relationship in one month or when given the OK by your doctor.   Return to work as instructed by your doctor.   Do not drive a car for 6 weeks or as instructed.   Wear elastic stockings until instructed not to.   Make sure you keep all of your appointments after your operation with all of your doctors and caregivers.  RANGE OF MOTION AND STRENGTHENING EXERCISES Rehabilitation of the knee is important following a knee injury or an operation. After just a few days of immobilization, the muscles of the thigh which control the knee become weakened and shrink (atrophy). Knee exercises are designed to build up the tone and strength of the thigh muscles and to improve knee motion. Often times heat used for twenty to thirty minutes before working out will loosen up your tissues and help with improving the range of motion. These exercises can be done on a training (exercise) mat, on the floor, on a table or on a bed. Use what ever works the best and is most  comfortable for you Knee exercises include:  Leg Lifts - While your knee is still immobilized in a splint or cast, you can do straight leg raises. Lift the leg to 60 degrees, hold for 3 sec, and slowly lower the leg. Repeat 10-20 times 2-3 times daily. Perform this exercise against resistance later as your knee gets better.   Quad and Hamstring Sets - Tighten up the muscle on the front of the thigh (Quad) and hold for 5-10 sec. Repeat this 10-20 times hourly. Hamstring sets are done by pushing the foot backward against an object and holding for 5-10 sec. Repeat as with quad sets.   A rehabilitation program following serious knee injuries can speed recovery and prevent re-injury in the future due to weakened muscles. Contact your doctor or a physical therapist for more information on knee rehabilitation. MAKE SURE YOU:   Understand these instructions.   Will watch your condition.   Will get help right away if you are not doing well or get worse.  Document Released: 08/16/2005 Document Revised: 04/28/2011 Document Reviewed: 02/03/2007 [employer] Patient Information 2012 [employer].  Pick up stool softner and laxative for home. Do not submerge incision under water. May shower. Continue to use ice for pain and swelling from surgery.  

## 2011-10-29 NOTE — Progress Notes (Signed)
Physical Therapy Treatment Patient Details Name: Hayden [name] MRN: [id number] DOB: 1946/07/21 Today's Date: 10/29/2011  PT Assessment/Plan  PT - Assessment/Plan Comments on Treatment Session: Marked improvement in activity tolerance from last session secondary to better Pain control PT Plan: Discharge plan remains appropriate PT Frequency: 7X/week Follow Up Recommendations: Home health PT Equipment Recommended: 3 in 1 bedside comode PT Goals  Acute Rehab PT Goals PT Goal Formulation: With patient Time For Goal Achievement: 7 days Pt will go Supine/Side to Sit: with supervision PT Goal: Supine/Side to Sit - Progress: Progressing toward goal Pt will go Sit to Supine/Side: with supervision PT Goal: Sit to Supine/Side - Progress: Progressing toward goal Pt will go Sit to Stand: with supervision PT Goal: Sit to Stand - Progress: Progressing toward goal Pt will go Stand to Sit: with supervision PT Goal: Stand to Sit - Progress: Progressing toward goal Pt will Ambulate: 51 - 150 feet;with supervision;with rolling walker PT Goal: Ambulate - Progress: Progressing toward goal  PT Treatment Precautions/Restrictions  Precautions Precautions: Knee Required Braces or Orthoses: Yes Knee Immobilizer: Discontinue once straight leg raise with < 10 degree lag Restrictions Weight Bearing Restrictions: No Other Position/Activity Restrictions: WBAT Mobility (including Balance) Bed Mobility Supine to Sit: 4: Min assist;3: Mod assist Supine to Sit Details (indicate cue type and reason): cues for self assisting with L LE and to slow speed Transfers Sit to Stand: 4: Min assist;From chair/3-in-1;With upper extremity assist Sit to Stand Details (indicate cue type and reason): min verbal cues Stand to Sit: 4: Min assist;With upper extremity assist;To chair/3-in-1 Stand to Sit Details: min verbal cues Ambulation/Gait Ambulation/Gait Assistance: 4: Min assist;3: Mod assist Ambulation/Gait Assistance  Details (indicate cue type and reason): cues for sequence, position from RW, and to slow pace Ambulation Distance (Feet): 74 Feet Assistive device: Rolling walker Gait Pattern: Step-to pattern    Exercise  Total Joint Exercises Ankle Circles/Pumps: AROM;10 reps;Supine;Both Quad Sets: AROM;10 reps;Supine;Both Heel Slides: 10 reps;AAROM;Supine;Right Straight Leg Raises: AAROM;10 reps;Supine;Right End of Session PT - End of Session Equipment Utilized During Treatment: Right knee immobilizer Activity Tolerance: Patient limited by pain;Patient tolerated treatment well Patient left: in chair;with call bell in reach;with family/visitor present Nurse Communication: Mobility status for transfers;Mobility status for ambulation General Behavior During Session: [facility] for tasks performed Cognition: [facility] for tasks performed  , 10/29/2011, 12:08 PM

## 2011-10-29 NOTE — Evaluation (Signed)
Occupational Therapy Evaluation Patient Details Name: Hayden [name] MRN: [id number] DOB: July 26, 1946 Today's Date: 10/29/2011  Problem List:  Patient Active Problem List  Diagnoses  . OA (osteoarthritis) of knee  . Postop Hyponatremia    Past Medical History:  Past Medical History  Diagnosis Date  . PONV (postoperative nausea and vomiting)     pt prefers epidural is possible  . Impaired hearing     both ears  . Coronary artery disease   . Hypertension   . Myocardial infarction 10-31-1989  . H/O hiatal hernia   . Arthritis   . Bright's disease age 5  . Kidney stones     89  in past plus stones  . Frequent urination    Past Surgical History:  Past Surgical History  Procedure Date  . Coronary artery bypass graft 10-2998    cabg x 3  . Nissen fundoplication 2006  . Lithortripsy     x 3  . Cystoscopy     10 to 12 times in past  . Lumbar laminectomy L 3 to L 4    Jul 01 1995  . Hernia repair 1998    with mesh  . Bilateral knee arthroscopy yrs ago  . Angioplasty 1991  . Cardiac catheterization 01-17-07  . Total knee arthroplasty 10/27/2011    Procedure: TOTAL KNEE ARTHROPLASTY;  Surgeon: [name], MD;  Location: [facility];  Service: Orthopedics;  Laterality: Right;    OT Assessment/Plan/Recommendation OT Assessment Clinical Impression Statement: Pt will benefit from skilled OT services to increase his independence and safety with ADl for discharge home. OT Recommendation/Assessment: Patient will need skilled OT in the acute care venue OT Problem List: Decreased strength;Decreased range of motion;Decreased knowledge of use of DME or AE;Pain OT Therapy Diagnosis : Generalized weakness OT Plan OT Frequency: Min 2X/week OT Treatment/Interventions: Self-care/ADL training;Therapeutic activities;DME and/or AE instruction;Patient/family education OT Recommendation Follow Up Recommendations: No OT follow up Equipment Recommended: 3 in 1 bedside comode Individuals  Consulted Consulted and Agree with Results and Recommendations: Patient;Family member/caregiver Family Member Consulted: spouse OT Goals Acute Rehab OT Goals OT Goal Formulation: With patient/family Time For Goal Achievement: 7 days ADL Goals Pt Will Transfer to Toilet: with supervision;with DME;Ambulation;3-in-1 ADL Goal: Toilet Transfer - Progress: Goal set today Pt Will Perform Toileting - Clothing Manipulation: with supervision;Standing Pt Will Perform Toileting - Hygiene: with supervision;Sit to stand from 3-in-1/toilet ADL Goal: Toileting - Hygiene - Progress: Goal set today Pt Will Perform Tub/Shower Transfer: with supervision;with DME;Other (comment) (with 3in1) ADL Goal: Tub/Shower Transfer - Progress: Goal set today  OT Evaluation Precautions/Restrictions  Precautions Precautions: Knee Required Braces or Orthoses: Yes Knee Immobilizer: Discontinue once straight leg raise with < 10 degree lag Restrictions Weight Bearing Restrictions: No Other Position/Activity Restrictions: WBAT Prior Functioning Home Living Lives With: Spouse Receives Help From: Family Type of Home: House Home Layout: One level Home Access: Stairs to enter Entrance Stairs-Rails: None Entrance Stairs-Number of Steps: 2 Bathroom Shower/Tub: Psychologist, counselling;Other (comment) (built in seat) Bathroom Toilet: Standard Home Adaptive Equipment: Raised toilet seat with rails Prior Function Level of Independence: Independent with basic ADLs;Independent with gait;Independent with transfers;Requires assistive device for independence ADL ADL Eating/Feeding: Simulated;Independent Where Assessed - Eating/Feeding: Chair Grooming: Simulated;Set up Where Assessed - Grooming: Sitting, chair Upper Body Bathing: Simulated;Chest;Right arm;Left arm;Abdomen Where Assessed - Upper Body Bathing: Sitting, chair;Unsupported Lower Body Bathing: Simulated;Minimal assistance Where Assessed - Lower Body Bathing: Sit to stand  from chair Upper Body Dressing: Simulated;Set up Where Assessed -  Upper Body Dressing: Sitting, chair;Unsupported Lower Body Dressing: Simulated;Moderate assistance Where Assessed - Lower Body Dressing: Sit to stand from chair Toilet Transfer: Performed;Minimal assistance Toilet Transfer Method: Ambulating Toilet Transfer Equipment: Raised toilet seat with arms (or 3-in-1 over toilet) Toileting - Clothing Manipulation: Simulated;Minimal assistance Where Assessed - Toileting Clothing Manipulation: Standing Toileting - Hygiene: Simulated;Minimal assistance Where Assessed - Toileting Hygiene: Standing Tub/Shower Transfer: Not assessed Tub/Shower Transfer Method: Not assessed Equipment Used: Rolling walker ADL Comments: Wife present for eval and educated both wife and pt on how to [employer]. Instructed on techniques for LB ADL and wife can assist.  Vision/Perception  Vision - History Baseline Vision: No visual deficits Cognition Cognition Arousal/Alertness: Awake/alert Overall Cognitive Status: Appears within functional limits for tasks assessed Orientation Level: Oriented X4 Sensation/Coordination Sensation Light Touch: Appears Intact Extremity Assessment RUE Assessment RUE Assessment: Within Functional Limits LUE Assessment LUE Assessment: Within Functional Limits Mobility  Transfers Sit to Stand: 4: Min assist;From chair/3-in-1;With upper extremity assist Sit to Stand Details (indicate cue type and reason): min verbal cues Stand to Sit: 4: Min assist;With upper extremity assist;To chair/3-in-1 Stand to Sit Details: min verbal cues Exercises   End of Session OT - End of Session Equipment Utilized During Treatment: Gait belt Activity Tolerance: Patient tolerated treatment well Patient left: in chair;with call bell in reach;with family/visitor present General Behavior During Session: [facility] for tasks performed Cognition: [facility] for tasks performed   Lennox Laity [phone] 10/29/2011, 11:28 AM

## 2011-10-29 NOTE — Progress Notes (Signed)
CARE MANAGEMENT NOTE 10/29/2011  Patient:  [name],[name]   Account Number:  [id number]  Date Initiated:  10/29/2011  Documentation initiated by:  Colleen Can  Subjective/Objective Assessment:   dx osteoarthritis right knee; total knee replacemnt     Action/Plan:   CM spoke with patient and spouse. Plans are for discharge to home with hh services. Requesting [facility] for hh services. Pt will need 3N1, RW   Anticipated DC Date:  10/30/2011   Anticipated DC Plan:  HOME W HOME HEALTH SERVICES  In-house referral  Clinical Social Worker      DC Planning Services  CM consult      [facility] Choice  HOME HEALTH   Choice offered to / List presented to:  C-1 Patient   DME arranged  3-N-1  Levan Hurst      DME agency  [facility]        [facility] agency  [facility]   Status of service:  In process, will continue to follow Medicare Important Message given?   (If response is "NO", the following Medicare IM given date fields will be blank) Date Medicare IM given:   Date Additional Medicare IM given:    Discharge Disposition:    Per UR Regulation:    Comments:  10/29/2011 [facility] will deliver dme to patient's room. [facility] was pre-arranged for [facility] services prior to admission if unable to get SNF facility of choice.HH pt orders faxed to [facility]  with confirmation-[phone] List of [facility] agencies placed on shadow chart.

## 2011-10-30 LAB — BASIC METABOLIC PANEL
BUN: 11 mg/dL (ref 6–23)
Chloride: 95 mEq/L — ABNORMAL LOW (ref 96–112)
GFR calc Af Amer: 85 mL/min — ABNORMAL LOW (ref >90)
Glucose, Bld: 103 mg/dL — ABNORMAL HIGH (ref 70–99)
Potassium: 3.8 mEq/L (ref 3.5–5.1)
Sodium: 131 mEq/L — ABNORMAL LOW (ref 135–145)

## 2011-10-30 LAB — CBC
HCT: 32 % — ABNORMAL LOW (ref 39.0–52.0)
Hemoglobin: 10.8 g/dL — ABNORMAL LOW (ref 13.0–17.0)
RDW: 12.4 % (ref 11.5–15.5)
WBC: 8.7 10*3/uL (ref 4.0–10.5)

## 2011-10-30 NOTE — Progress Notes (Signed)
Pt stable, scripts, equipment,  and d/c instructions given.  No questions/concerns voiced by patient.  Pt transported via wheelchair to private vehicle with NT and family.

## 2011-10-30 NOTE — Progress Notes (Signed)
Subjective: 3 Days Post-Op Procedure(s) (LRB): TOTAL KNEE ARTHROPLASTY (Right)   Patient reports pain as mild. No events. Ready to be discharged home.  Objective:   VITALS:   Filed Vitals:   10/30/11 0540  BP: 129/73  Pulse: 93  Temp: 98.1 F (36.7 C)  Resp: 16    Neurovascular intact Dorsiflexion/Plantar flexion intact Incision: dressing C/D/I No cellulitis present Compartment soft  LABS  Basename 10/30/11 0443 10/29/11 0415 10/28/11 0436  HGB 10.8* 11.8* 11.1*  HCT 32.0* 35.2* 33.7*  WBC 8.7 9.1 8.2  PLT 190 200 230     Basename 10/30/11 0443 10/29/11 0415 10/28/11 0436  NA 131* 133* 132*  K 3.8 3.9 4.0  BUN 11 5* 6  CREATININE 1.03 0.78 0.80  GLUCOSE 103* 135* 118*     Assessment/Plan: 3 Days Post-Op Procedure(s) (LRB): TOTAL KNEE ARTHROPLASTY (Right)   Up with therapy Discharge home with home health    Anastasio Auerbach.    PAC  10/30/2011, 7:25 AM

## 2011-10-30 NOTE — Progress Notes (Signed)
OT Note:  Pt practiced going up stairs backwards with PT.  Feels he will be OK with his bathroom transfers.  Reviewed sequence.  No further OT needs.  [place], OTR/L [phone] 10/30/2011

## 2011-10-30 NOTE — Progress Notes (Signed)
Physical Therapy Treatment Patient Details Name: Hayden [name] MRN: [id number] DOB: December 03, 1945 Today's Date: 10/30/2011  PT Assessment/Plan  PT - Assessment/Plan PT Plan: Discharge plan remains appropriate PT Frequency: 7X/week Follow Up Recommendations: Home health PT Equipment Recommended: 3 in 1 bedside comode PT Goals  Acute Rehab PT Goals PT Goal Formulation: With patient Time For Goal Achievement: 7 days Pt will go Supine/Side to Sit: with supervision PT Goal: Supine/Side to Sit - Progress: Met Pt will go Sit to Supine/Side: with supervision PT Goal: Sit to Supine/Side - Progress: Met Pt will go Sit to Stand: with supervision PT Goal: Sit to Stand - Progress: Met Pt will go Stand to Sit: with supervision PT Goal: Stand to Sit - Progress: Met Pt will Ambulate: 51 - 150 feet;with supervision;with rolling walker PT Goal: Ambulate - Progress: Met Pt will Go Up / Down Stairs: 1-2 stairs;with min assist;with least restrictive assistive device PT Goal: Up/Down Stairs - Progress: Met Pt will Perform Home Exercise Program: with min assist PT Goal: Perform Home Exercise Program - Progress: Progressing toward goal  PT Treatment Precautions/Restrictions  Precautions Precautions: Knee Required Braces or Orthoses: Yes Knee Immobilizer: Discontinue once straight leg raise with < 10 degree lag Restrictions Weight Bearing Restrictions: No Other Position/Activity Restrictions: WBAT Mobility (including Balance) Bed Mobility Supine to Sit: 5: Supervision Supine to Sit Details (indicate cue type and reason): cues for self assist Transfers Sit to Stand: 5: Supervision;From bed;With upper extremity assist Stand to Sit: 5: Supervision Ambulation/Gait Ambulation/Gait Assistance: 5: Supervision Ambulation/Gait Assistance Details (indicate cue type and reason): cues for position from RW Ambulation Distance (Feet): 150 Feet Assistive device: Rolling walker Gait Pattern: Step-to  pattern Stairs: Yes Stairs Assistance: 4: Min assist Stairs Assistance Details (indicate cue type and reason): cues for sequence and for foot/RW placement Stair Management Technique: No rails;Backwards;With walker Number of Stairs: 2  (2x2 with spouse assisting on 2nd attempt)    Exercise  Total Joint Exercises Ankle Circles/Pumps: AROM;20 reps;Supine;Both Quad Sets: Supine;20 reps;Both Short Arc Quad: AAROM;Both;10 reps;5 reps;Supine Heel Slides: AAROM;20 reps;Right;Supine Straight Leg Raises: AAROM;20 reps;Supine;Right End of Session PT - End of Session Equipment Utilized During Treatment: Right knee immobilizer Activity Tolerance: Patient tolerated treatment well Patient left: in chair;with call bell in reach;with family/visitor present Nurse Communication: Mobility status for transfers;Mobility status for ambulation General Behavior During Session: [facility] for tasks performed Cognition: [facility] for tasks performed  , 10/30/2011, 1:00 PM

## 2011-11-04 DIAGNOSIS — D62 Acute posthemorrhagic anemia: Secondary | ICD-10-CM | POA: Diagnosis not present

## 2011-11-04 NOTE — Discharge Summary (Signed)
Physician Discharge Summary   Patient ID: Hayden [name] MRN: [id number] DOB/AGE: [birth year] 66 y.o.  Admit date: 10/27/2011 Discharge date: 10/30/2011  Primary Diagnosis: Osteoarthritis Right Knee  Admission Diagnoses: Past Medical History  Diagnosis Date  . PONV (postoperative nausea and vomiting)     pt prefers epidural is possible  . Impaired hearing     both ears  . Coronary artery disease   . Hypertension   . Myocardial infarction 10-31-1989  . H/O hiatal hernia   . Arthritis   . 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 Kidney stones     100  in past plus stones  . Frequent urination     Discharge Diagnoses:  Principal Problem:  *OA (osteoarthritis) of knee Active Problems:  Postop Hyponatremia   Procedure: Procedure(s) (LRB): TOTAL KNEE ARTHROPLASTY (Right)   Consults: None  HPI: TRAVONTA [name] is a 66 y.o. year old male with end stage OA of his right knee with progressively worsening pain and dysfunction. He has constant pain, with activity and at rest and significant functional deficits with difficulties even with ADLs. He has had extensive non-op management including analgesics, injections of cortisone, and home exercise program, but remains in significant pain with significant dysfunction. He has severe patellofemoral OA with significant erosion of the patellar thickness. He presents now for right Total Knee Arthroplasty.   Laboratory Data: Hospital Outpatient Visit on 10/19/2011  Component Date Value Range Status  . aPTT (seconds) 10/19/2011 29  24-37 Final  . WBC (K/uL) 10/19/2011 8.1  4.0-10.5 Final  . RBC (MIL/uL) 10/19/2011 4.34  4.22-5.81 Final  . Hemoglobin (g/dL) 40/98/1191 47.8  29.5-62.1 Final  . HCT (%) 10/19/2011 41.3  39.0-52.0 Final  . MCV (fL) 10/19/2011 95.2  78.0-100.0 Final  . MCH (pg) 10/19/2011 32.9  26.0-34.0 Final  . MCHC (g/dL) 30/86/5784 69.6  29.5-28.4 Final  . RDW (%) 10/19/2011 12.7  11.5-15.5 Final  . Platelets (K/uL) 10/19/2011 224  150-400  Final  . Sodium (mEq/L) 10/19/2011 134* 135-145 Final  . Potassium (mEq/L) 10/19/2011 3.8  3.5-5.1 Final  . Chloride (mEq/L) 10/19/2011 97  96-112 Final  . CO2 (mEq/L) 10/19/2011 28  19-32 Final  . Glucose, Bld (mg/dL) 13/24/4010 87  27-25 Final  . BUN (mg/dL) 36/64/4034 8  7-42 Final  . Creatinine, Ser (mg/dL) 59/56/3875 6.43  3.29-5.18 Final  . Calcium (mg/dL) 84/16/6063 9.9  0.1-60.1 Final  . Total Protein (g/dL) 09/32/3557 7.2  3.2-2.0 Final  . Albumin (g/dL) 25/42/7062 4.1  3.7-6.2 Final  . AST (U/L) 10/19/2011 20  0-37 Final  . ALT (U/L) 10/19/2011 20  0-53 Final  . Alkaline Phosphatase (U/L) 10/19/2011 62  39-117 Final  . Total Bilirubin (mg/dL) 83/15/1761 0.3  6.0-7.3 Final  . GFR calc non Af Amer (mL/min) 10/19/2011 89* >90 Final  . GFR calc Af Amer (mL/min) 10/19/2011 >90  >90 Final   Comment:                                 The eGFR has been calculated                          using the CKD EPI equation.                          This calculation has not been  validated in all clinical                          situations.                          eGFR's persistently                          <90 mL/min signify                          possible Chronic Kidney Disease.  Marland Kitchen Prothrombin Time (seconds) 10/19/2011 13.0  11.6-15.2 Final  . INR  10/19/2011 0.96  0.00-1.49 Final  . Color, Urine  10/19/2011 YELLOW  YELLOW Final  . APPearance  10/19/2011 CLEAR  CLEAR Final  . Specific Gravity, Urine  10/19/2011 1.019  1.005-1.030 Final  . pH  10/19/2011 6.5  5.0-8.0 Final  . Glucose, UA (mg/dL) 40/98/1191 NEGATIVE  NEGATIVE Final  . Hgb urine dipstick  10/19/2011 NEGATIVE  NEGATIVE Final  . Bilirubin Urine  10/19/2011 NEGATIVE  NEGATIVE Final  . Ketones, ur (mg/dL) 47/82/9562 NEGATIVE  NEGATIVE Final  . Protein, ur (mg/dL) 13/03/6577 NEGATIVE  NEGATIVE Final  . Urobilinogen, UA (mg/dL) 46/96/2952 0.2  8.4-1.3 Final  . Nitrite  10/19/2011 NEGATIVE  NEGATIVE  Final  . Leukocytes, UA  10/19/2011 NEGATIVE  NEGATIVE Final   MICROSCOPIC NOT DONE ON URINES WITH NEGATIVE PROTEIN, BLOOD, LEUKOCYTES, NITRITE, OR GLUCOSE <1000 mg/dL.  Marland Kitchen MRSA, PCR  10/19/2011 NEGATIVE  NEGATIVE Final  . Staphylococcus aureus  10/19/2011 POSITIVE* NEGATIVE Final   Comment:                                 The Xpert SA Assay (FDA                          approved for NASAL specimens                          only), is one component of                          a comprehensive surveillance                          program.  It is not intended                          to diagnose infection nor to                          guide or monitor treatment.   No results found for this basename: HGB:5 in the last 72 hours No results found for this basename: WBC:2,RBC:2,HCT:2,PLT:2 in the last 72 hours No results found for this basename: NA:2,K:2,CL:2,CO2:2,BUN:2,CREATININE:2,GLUCOSE:2,CALCIUM:2 in the last 72 hours No results found for this basename: LABPT:2,INR:2 in the last 72 hours  X-Rays:Dg Chest 2 View  10/19/2011  *RADIOLOGY REPORT*  Clinical Data: History of coronary bypass, preoperative evaluation for knee replacement  CHEST - 2 VIEW  Comparison: 07/16/2010  Findings: Previous coronary bypass changes noted.  Normal heart size and vascularity.  Negative for pneumonia or CHF.  No effusion or pneumothorax.  Trachea midline.  No significant interval change. Degenerative changes of the left AC joint with left distal clavicle osteolysis.  IMPRESSION: Previous coronary bypass and chronic degenerative changes.  No acute chest process.  Original Report Authenticated By: Judie Petit. Ruel Favors, M.D.    EKG:No orders found for this or any previous visit.   Hospital Course: Patient was admitted to [facility] and taken to the OR and underwent the above state procedure without complications.  Patient tolerated the procedure well and was later transferred to the recovery room and then to the  orthopaedic floor for postoperative care.  They were given PO and IV analgesics for pain control following their surgery.  They were given 24 hours of postoperative antibiotics and started on DVT prophylaxis.   PT and OT were ordered for total joint protocol.  Discharge planning consulted to help with postop disposition and equipment needs.  Patient had a rough night on the evening of surgery due to pain but started to get up with therapy on day one.  PCA was discontinued and they were weaned over to PO meds.  Hemovac drain was pulled without difficulty. Patient had complaints of pain and spasms. His wife was in the room with him that morning. He had moderate to severe pain last evening. Better on day 2 but still hurting. Positive for flatus but only scant movement. Plan was to go home. Maybe Saturday but maybe Sunday depending upon progress. Changed Robaxin to valium yesterday but not much difference. Switched back to Robaxin as to avoid to many side effects. He stated that he had been anxious due to his pain levels. He has history of depression but no documented anxiety. Added Xanax low dose if needed.  Continued to progress with therapy into day two despite the pain.  Dressing was changed on day two and the incision was healing well.  By day three, the patient had progressed with therapy and meeting goals. Pain was improved. Incision was healing well.  Patient was seen in rounds and was ready to go home.  Discharge Medications: Prior to Admission medications   Medication Sig Start Date End Date Taking? Authorizing Provider  amLODipine (NORVASC) 10 MG tablet Take 15 mg by mouth daily after breakfast.   Yes Historical Provider, MD  bisoprolol-hydrochlorothiazide (ZIAC) 5-6.25 MG per tablet Take 0.5 tablets by mouth daily after breakfast.   Yes Historical Provider, MD  DULoxetine (CYMBALTA) 30 MG capsule Take 30 mg by mouth daily after breakfast.   Yes Historical Provider, MD  ezetimibe-simvastatin (VYTORIN)  10-20 MG per tablet Take 1 tablet by mouth daily after breakfast.   Yes Historical Provider, MD  olmesartan (BENICAR) 40 MG tablet Take 40 mg by mouth daily after breakfast.   Yes Historical Provider, MD  imiquimod (ALDARA) 5 % cream Apply 1 application topically 2 (two) times daily as needed. Warts    Historical Provider, MD  methocarbamol (ROBAXIN) 500 MG tablet Take 1 tablet (500 mg total) by mouth every 6 (six) hours as needed. 10/29/11 11/08/11   , PA  oxyCODONE (OXY IR/ROXICODONE) 5 MG immediate release tablet Take 1-2 tablets (5-10 mg total) by mouth every 3 (three) hours as needed. 10/29/11 11/08/11   Julien Girt, PA  rivaroxaban (XARELTO) 10 MG TABS tablet Take 1 tablet (10 mg total) by mouth daily with breakfast. 10/29/11    Julien Girt, PA    Diet: heart healthy Activity:WBAT Follow-up:in 2 weeks Disposition: home Discharged Condition:  good   Discharge Orders    Future Orders Please Complete By Expires   Diet - low sodium heart healthy      Call MD / Call 911      Comments:   If you experience chest pain or shortness of breath, CALL 911 and be transported to the hospital emergency room.  If you develope a fever above 101 F, pus (white drainage) or increased drainage or redness at the wound, or calf pain, call your surgeon's office.   Constipation Prevention      Comments:   Drink plenty of fluids.  Prune juice may be helpful.  You may use a stool softener, such as Colace (over the counter) 100 mg twice a day.  Use MiraLax (over the counter) for constipation as needed.   Increase activity slowly as tolerated      Weight Bearing as taught in Physical Therapy      Comments:   Use a walker or crutches as instructed.   Discharge instructions      Comments:   Pick up stool softner and laxative for home. Do not submerge incision under water. May shower. Continue to use ice for pain and swelling from surgery.    Driving restrictions      Comments:   No  driving   Lifting restrictions      Comments:   No lifting   TED hose      Comments:   Use stockings (TED hose) for 3 weeks on both leg(s).  You may remove them at night for sleeping.   Change dressing      Comments:   Change dressing daily with sterile 4 x 4 inch gauze dressing and apply TED hose.   Do not put a pillow under the knee. Place it under the heel.        Medication List  As of 11/04/2011 10:34 AM   STOP taking these medications         aspirin EC 81 MG tablet      CO Q 10 PO      ibuprofen 200 MG tablet      oxyCODONE-acetaminophen 5-325 MG per tablet         TAKE these medications         amLODipine 10 MG tablet   Commonly known as: NORVASC   Take 15 mg by mouth daily after breakfast.      bisoprolol-hydrochlorothiazide 5-6.25 MG per tablet   Commonly known as: ZIAC   Take 0.5 tablets by mouth daily after breakfast.      DULoxetine 30 MG capsule   Commonly known as: CYMBALTA   Take 30 mg by mouth daily after breakfast.      ezetimibe-simvastatin 10-20 MG per tablet   Commonly known as: VYTORIN   Take 1 tablet by mouth daily after breakfast.      imiquimod 5 % cream   Commonly known as: ALDARA   Apply 1 application topically 2 (two) times daily as needed. Warts      methocarbamol 500 MG tablet   Commonly known as: ROBAXIN   Take 1 tablet (500 mg total) by mouth every 6 (six) hours as needed.      olmesartan 40 MG tablet   Commonly known as: BENICAR   Take 40 mg by mouth daily after breakfast.      oxyCODONE 5 MG immediate release tablet   Commonly known as: Oxy IR/ROXICODONE   Take 1-2 tablets (5-10 mg total) by  mouth every 3 (three) hours as needed.      rivaroxaban 10 MG Tabs tablet   Commonly known as: XARELTO   Take 1 tablet (10 mg total) by mouth daily with breakfast.           Follow-up Information    Follow up with [name], MD. Schedule an appointment as soon as possible for a visit today.   Contact information:    [facility] [address] [place] [address] [phone]          Signed: Patrica Duel 11/04/2011, 10:34 AM

## 2011-11-11 ENCOUNTER — Other Ambulatory Visit: Payer: Self-pay | Admitting: Internal Medicine

## 2011-11-11 DIAGNOSIS — E221 Hyperprolactinemia: Secondary | ICD-10-CM

## 2011-11-17 ENCOUNTER — Ambulatory Visit
Admission: RE | Admit: 2011-11-17 | Discharge: 2011-11-17 | Disposition: A | Discharge status: Home / Self Care | Payer: 59 | Source: Ambulatory Visit | Attending: Internal Medicine | Admitting: Internal Medicine

## 2011-11-17 DIAGNOSIS — E221 Hyperprolactinemia: Secondary | ICD-10-CM

## 2011-11-17 MED ORDER — GADOBENATE DIMEGLUMINE 529 MG/ML IV SOLN
8.0000 mL | Freq: Once | INTRAVENOUS | Status: AC | PRN
  Administered 2011-11-17: 8 mL via INTRAVENOUS

## 2012-09-04 ENCOUNTER — Emergency Department (HOSPITAL_COMMUNITY): Payer: 59

## 2012-09-04 ENCOUNTER — Inpatient Hospital Stay (HOSPITAL_COMMUNITY)
Admission: EM | Admit: 2012-09-04 | Discharge: 2012-09-06 | DRG: 281 | Disposition: A | Discharge status: Home / Self Care | Payer: 59 | Attending: Cardiology | Admitting: Cardiology

## 2012-09-04 ENCOUNTER — Encounter (HOSPITAL_COMMUNITY): Payer: Self-pay | Admitting: *Deleted

## 2012-09-04 DIAGNOSIS — I10 Essential (primary) hypertension: Secondary | ICD-10-CM | POA: Diagnosis present

## 2012-09-04 DIAGNOSIS — F1121 Opioid dependence, in remission: Secondary | ICD-10-CM | POA: Diagnosis present

## 2012-09-04 DIAGNOSIS — I2589 Other forms of chronic ischemic heart disease: Secondary | ICD-10-CM | POA: Diagnosis present

## 2012-09-04 DIAGNOSIS — I252 Old myocardial infarction: Secondary | ICD-10-CM

## 2012-09-04 DIAGNOSIS — Z96659 Presence of unspecified artificial knee joint: Secondary | ICD-10-CM

## 2012-09-04 DIAGNOSIS — I214 Non-ST elevation (NSTEMI) myocardial infarction: Secondary | ICD-10-CM

## 2012-09-04 DIAGNOSIS — F1120 Opioid dependence, uncomplicated: Secondary | ICD-10-CM

## 2012-09-04 DIAGNOSIS — Z87442 Personal history of urinary calculi: Secondary | ICD-10-CM

## 2012-09-04 DIAGNOSIS — M129 Arthropathy, unspecified: Secondary | ICD-10-CM | POA: Diagnosis present

## 2012-09-04 DIAGNOSIS — I510 Cardiac septal defect, acquired: Secondary | ICD-10-CM | POA: Diagnosis present

## 2012-09-04 DIAGNOSIS — H9190 Unspecified hearing loss, unspecified ear: Secondary | ICD-10-CM | POA: Diagnosis present

## 2012-09-04 DIAGNOSIS — Z951 Presence of aortocoronary bypass graft: Secondary | ICD-10-CM

## 2012-09-04 DIAGNOSIS — N059 Unspecified nephritic syndrome with unspecified morphologic changes: Secondary | ICD-10-CM | POA: Diagnosis present

## 2012-09-04 DIAGNOSIS — I2510 Atherosclerotic heart disease of native coronary artery without angina pectoris: Secondary | ICD-10-CM

## 2012-09-04 DIAGNOSIS — I5031 Acute diastolic (congestive) heart failure: Principal | ICD-10-CM

## 2012-09-04 HISTORY — DX: Testicular hypofunction: E29.1

## 2012-09-04 HISTORY — DX: Anxiety disorder, unspecified: F41.9

## 2012-09-04 HISTORY — DX: Opioid dependence, uncomplicated: F11.20

## 2012-09-04 LAB — CBC WITH DIFFERENTIAL/PLATELET
Basophils Absolute: 0 10*3/uL (ref 0.0–0.1)
Basophils Relative: 0 % (ref 0–1)
Eosinophils Absolute: 0.1 10*3/uL (ref 0.0–0.7)
HCT: 34.4 % — ABNORMAL LOW (ref 39.0–52.0)
Lymphocytes Relative: 12 % (ref 12–46)
Lymphocytes Relative: 23 % (ref 12–46)
Lymphs Abs: 1.7 10*3/uL (ref 0.7–4.0)
MCHC: 34 g/dL (ref 30.0–36.0)
Monocytes Absolute: 0.7 10*3/uL (ref 0.1–1.0)
Neutro Abs: 5.9 10*3/uL (ref 1.7–7.7)
Neutrophils Relative %: 66 % (ref 43–77)
Platelets: 208 10*3/uL (ref 150–400)
Platelets: 195 10*3/uL (ref 150–400)
RBC: 3.89 MIL/uL — ABNORMAL LOW (ref 4.22–5.81)
RDW: 14.5 % (ref 11.5–15.5)
WBC: 7.7 10*3/uL (ref 4.0–10.5)
WBC: 7.2 10*3/uL (ref 4.0–10.5)

## 2012-09-04 LAB — BASIC METABOLIC PANEL
CO2: 27 mEq/L (ref 19–32)
Calcium: 8.9 mg/dL (ref 8.4–10.5)
Chloride: 95 mEq/L — ABNORMAL LOW (ref 96–112)
Creatinine, Ser: 0.88 mg/dL (ref 0.50–1.35)
GFR calc Af Amer: >90 mL/min (ref >90)
Sodium: 131 mEq/L — ABNORMAL LOW (ref 135–145)

## 2012-09-04 LAB — MRSA PCR SCREENING: MRSA by PCR: NEGATIVE

## 2012-09-04 LAB — COMPREHENSIVE METABOLIC PANEL
ALT: 10 U/L (ref 0–53)
AST: 29 U/L (ref 0–37)
CO2: 29 mEq/L (ref 19–32)
Calcium: 9.1 mg/dL (ref 8.4–10.5)
Chloride: 98 mEq/L (ref 96–112)
GFR calc non Af Amer: 86 mL/min — ABNORMAL LOW (ref >90)
Sodium: 135 mEq/L (ref 135–145)

## 2012-09-04 LAB — PRO B NATRIURETIC PEPTIDE: Pro B Natriuretic peptide (BNP): 1762 pg/mL — ABNORMAL HIGH (ref 0–125)

## 2012-09-04 LAB — PROTIME-INR: Prothrombin Time: 14.4 seconds (ref 11.6–15.2)

## 2012-09-04 LAB — TROPONIN I
Troponin I: 2.49 ng/mL (ref <0.30)
Troponin I: 1.07 ng/mL (ref <0.30)

## 2012-09-04 LAB — HEPARIN LEVEL (UNFRACTIONATED): Heparin Unfractionated: 0.37 IU/mL (ref 0.30–0.70)

## 2012-09-04 MED ORDER — EZETIMIBE-SIMVASTATIN 10-20 MG PO TABS: 1.0000 | ORAL_TABLET | Freq: Every day | ORAL | Status: DC

## 2012-09-04 MED ORDER — ASPIRIN 81 MG PO CHEW: 324.0000 mg | CHEWABLE_TABLET | ORAL | Status: AC

## 2012-09-04 MED ORDER — METHOCARBAMOL 500 MG PO TABS
1000.0000 mg | ORAL_TABLET | Freq: Four times a day (QID) | ORAL | Status: DC | PRN
  Filled 2012-09-04: qty 2

## 2012-09-04 MED ORDER — NITROGLYCERIN 0.4 MG SL SUBL: 0.4000 mg | SUBLINGUAL_TABLET | SUBLINGUAL | Status: DC | PRN

## 2012-09-04 MED ORDER — IPRATROPIUM BROMIDE 0.02 % IN SOLN
0.5000 mg | Freq: Once | RESPIRATORY_TRACT | Status: AC
  Administered 2012-09-04: 0.5 mg via RESPIRATORY_TRACT
  Filled 2012-09-04: qty 2.5

## 2012-09-04 MED ORDER — DULOXETINE HCL 60 MG PO CPEP
90.0000 mg | ORAL_CAPSULE | Freq: Every day | ORAL | Status: DC
  Administered 2012-09-05 – 2012-09-06 (×2): 90 mg via ORAL
  Filled 2012-09-04 (×2): qty 1

## 2012-09-04 MED ORDER — DICYCLOMINE HCL 20 MG PO TABS
20.0000 mg | ORAL_TABLET | Freq: Three times a day (TID) | ORAL | Status: DC
  Administered 2012-09-04 – 2012-09-06 (×5): 20 mg via ORAL
  Filled 2012-09-04 (×10): qty 1

## 2012-09-04 MED ORDER — SODIUM CHLORIDE 0.9 % IJ SOLN
3.0000 mL | Freq: Two times a day (BID) | INTRAMUSCULAR | Status: DC
  Administered 2012-09-04 – 2012-09-05 (×2): 3 mL via INTRAVENOUS

## 2012-09-04 MED ORDER — AMLODIPINE BESYLATE 2.5 MG PO TABS
7.5000 mg | ORAL_TABLET | Freq: Every day | ORAL | Status: DC
  Administered 2012-09-05 – 2012-09-06 (×2): 7.5 mg via ORAL
  Filled 2012-09-04 (×2): qty 1

## 2012-09-04 MED ORDER — ASPIRIN 300 MG RE SUPP
300.0000 mg | RECTAL | Status: AC
  Filled 2012-09-04: qty 1

## 2012-09-04 MED ORDER — HEPARIN BOLUS VIA INFUSION
4000.0000 [IU] | Freq: Once | INTRAVENOUS | Status: AC
  Administered 2012-09-04: 4000 [IU] via INTRAVENOUS

## 2012-09-04 MED ORDER — ADULT MULTIVITAMIN W/MINERALS CH
1.0000 | ORAL_TABLET | Freq: Every day | ORAL | Status: DC
  Administered 2012-09-05: 1 via ORAL
  Filled 2012-09-04 (×2): qty 1

## 2012-09-04 MED ORDER — VITAMIN D 50 MCG (2000 UT) PO TABS: 2000.0000 [IU] | ORAL_TABLET | Freq: Every day | ORAL | Status: DC

## 2012-09-04 MED ORDER — BISOPROLOL-HYDROCHLOROTHIAZIDE 5-6.25 MG PO TABS
0.5000 | ORAL_TABLET | Freq: Every day | ORAL | Status: DC
  Administered 2012-09-05 – 2012-09-06 (×2): 0.5 via ORAL
  Filled 2012-09-04 (×3): qty 0.5

## 2012-09-04 MED ORDER — IRBESARTAN 300 MG PO TABS
300.0000 mg | ORAL_TABLET | Freq: Every day | ORAL | Status: DC
  Administered 2012-09-05 – 2012-09-06 (×2): 300 mg via ORAL
  Filled 2012-09-04 (×2): qty 1

## 2012-09-04 MED ORDER — SODIUM CHLORIDE 0.9 % IV SOLN
250.0000 mL | INTRAVENOUS | Status: DC | PRN
  Administered 2012-09-04: 250 mL via INTRAVENOUS

## 2012-09-04 MED ORDER — ONDANSETRON HCL 4 MG/2ML IJ SOLN: 4.0000 mg | Freq: Four times a day (QID) | INTRAMUSCULAR | Status: DC | PRN

## 2012-09-04 MED ORDER — FUROSEMIDE 10 MG/ML IJ SOLN
40.0000 mg | Freq: Two times a day (BID) | INTRAMUSCULAR | Status: DC
  Administered 2012-09-04 – 2012-09-05 (×3): 40 mg via INTRAVENOUS
  Filled 2012-09-04 (×5): qty 4

## 2012-09-04 MED ORDER — ALBUTEROL SULFATE (5 MG/ML) 0.5% IN NEBU
5.0000 mg | INHALATION_SOLUTION | Freq: Once | RESPIRATORY_TRACT | Status: AC
  Administered 2012-09-04: 5 mg via RESPIRATORY_TRACT
  Filled 2012-09-04: qty 1

## 2012-09-04 MED ORDER — ASPIRIN EC 81 MG PO TBEC
81.0000 mg | DELAYED_RELEASE_TABLET | Freq: Every day | ORAL | Status: DC
  Administered 2012-09-05 – 2012-09-06 (×2): 81 mg via ORAL
  Filled 2012-09-04 (×2): qty 1

## 2012-09-04 MED ORDER — ACETAMINOPHEN 325 MG PO TABS: 650.0000 mg | ORAL_TABLET | ORAL | Status: DC | PRN

## 2012-09-04 MED ORDER — ASPIRIN 325 MG PO TABS
325.0000 mg | ORAL_TABLET | Freq: Once | ORAL | Status: AC
  Administered 2012-09-04: 325 mg via ORAL
  Filled 2012-09-04: qty 1

## 2012-09-04 MED ORDER — HEPARIN (PORCINE) IN NACL 100-0.45 UNIT/ML-% IJ SOLN
1450.0000 [IU]/h | INTRAMUSCULAR | Status: DC
  Administered 2012-09-04 (×2): 1100 [IU]/h via INTRAVENOUS
  Administered 2012-09-05: 1300 [IU]/h via INTRAVENOUS
  Administered 2012-09-05: 1100 [IU]/h via INTRAVENOUS
  Administered 2012-09-05 – 2012-09-06 (×2): 1450 [IU]/h via INTRAVENOUS
  Filled 2012-09-04 (×4): qty 250

## 2012-09-04 MED ORDER — ALBUTEROL SULFATE HFA 108 (90 BASE) MCG/ACT IN AERS: 2.0000 | INHALATION_SPRAY | Freq: Four times a day (QID) | RESPIRATORY_TRACT | Status: DC | PRN

## 2012-09-04 MED ORDER — TOBRAMYCIN-DEXAMETHASONE 0.3-0.1 % OP SUSP
1.0000 [drp] | Freq: Four times a day (QID) | OPHTHALMIC | Status: DC
  Administered 2012-09-04 – 2012-09-06 (×6): 1 [drp] via OPHTHALMIC
  Filled 2012-09-04: qty 2.5

## 2012-09-04 MED ORDER — SODIUM CHLORIDE 0.9 % IJ SOLN: 3.0000 mL | INTRAMUSCULAR | Status: DC | PRN

## 2012-09-04 MED ORDER — HEPARIN SODIUM (PORCINE) 5000 UNIT/ML IJ SOLN: 60.0000 [IU]/kg | Freq: Once | INTRAMUSCULAR | Status: DC

## 2012-09-04 MED ORDER — VITAMIN D3 25 MCG (1000 UNIT) PO TABS
2000.0000 [IU] | ORAL_TABLET | Freq: Every day | ORAL | Status: DC
  Administered 2012-09-05 – 2012-09-06 (×2): 2000 [IU] via ORAL
  Filled 2012-09-04 (×2): qty 2

## 2012-09-04 MED ORDER — FUROSEMIDE 10 MG/ML IJ SOLN
40.0000 mg | Freq: Once | INTRAMUSCULAR | Status: AC
  Administered 2012-09-04: 40 mg via INTRAVENOUS
  Filled 2012-09-04: qty 4

## 2012-09-04 NOTE — ED Notes (Signed)
Patient had oxygen off. Room air Sats 85%. O2 replaced and reminded patient to keep O2 on.

## 2012-09-04 NOTE — ED Provider Notes (Signed)
History     CSN: [id number]  Arrival date & time 09/04/12  1478   First MD Initiated Contact with Patient 09/04/12 [phone]      Chief Complaint  Patient presents with  . Cough  . Shortness of Breath    (Consider location/radiation/quality/duration/timing/severity/associated sxs/prior treatment) HPI.... shortness of breath for 2 days. No chest pain. Patient is currently inpatient at [facility] for opiate detox. Has had prodromal upper respiratory infection including productive cough.  Cardiac risk factors include MI in 1991, CABG in 2000, hypercholesterolemia, or tension.  Exertion makes symptoms worse. Severity is moderate to severe  Past Medical History  Diagnosis Date  . PONV (postoperative nausea and vomiting)     pt prefers epidural is possible  . Impaired hearing     both ears  . Coronary artery disease   . Hypertension   . Myocardial infarction 10-31-1989  . H/O hiatal hernia   . Arthritis   . Bright's disease age 24  . Kidney stones     100  in past plus stones  . Frequent urination     Past Surgical History  Procedure Date  . Coronary artery bypass graft [id number]    cabg x 3  . Nissen fundoplication 2006  . Lithortripsy     x 3  . Cystoscopy     10 to 12 times in past  . Lumbar laminectomy L 3 to L 4    Jul 01 1995  . Hernia repair 1998    with mesh  . Bilateral knee arthroscopy yrs ago  . Angioplasty 1991  . Cardiac catheterization 01-17-07  . Total knee arthroplasty 10/27/2011    Procedure: TOTAL KNEE ARTHROPLASTY;  Surgeon: [name], MD;  Location: [facility] ORS;  Service: Orthopedics;  Laterality: Right;    No family history on file.  History  Substance Use Topics  . Smoking status: Never Smoker   . Smokeless tobacco: Never Used  . Alcohol Use: No      Review of Systems  All other systems reviewed and are negative.    Allergies  Celecoxib; Penicillins; and Sulfa antibiotics  Home Medications   Current Outpatient Rx  Name  Route  Sig   Dispense  Refill  . BISOPROLOL-HYDROCHLOROTHIAZIDE 5-6.25 MG PO TABS   Oral   Take 0.5 tablets by mouth daily after breakfast.         . DULOXETINE HCL 30 MG PO CPEP   Oral   Take 30 mg by mouth daily after breakfast.         . EZETIMIBE-SIMVASTATIN 10-20 MG PO TABS   Oral   Take 1 tablet by mouth daily after breakfast.         . IMIQUIMOD 5 % EX CREA   Topical   Apply 1 application topically 2 (two) times daily as needed. Warts         . OLMESARTAN MEDOXOMIL 40 MG PO TABS   Oral   Take 40 mg by mouth daily after breakfast.         . RIVAROXABAN 10 MG PO TABS   Oral   Take 1 tablet (10 mg total) by mouth daily with breakfast.   18 tablet   0     BP 117/60  Pulse 91  Temp 98.6 F (37 C) (Oral)  Resp 20  SpO2 92%  Physical Exam  Nursing note and vitals reviewed. Constitutional: He is oriented to person, place, and time. He appears well-developed and well-nourished.  Good color, however patient desats off of oxygen  HENT:  Head: Normocephalic and atraumatic.  Eyes: Conjunctivae normal and EOM are normal. Pupils are equal, round, and reactive to light.  Neck: Normal range of motion. Neck supple.  Cardiovascular: Normal rate, regular rhythm and normal heart sounds.   Pulmonary/Chest: Effort normal and breath sounds normal.  Abdominal: Soft. Bowel sounds are normal.  Musculoskeletal: Normal range of motion.  Neurological: He is alert and oriented to person, place, and time.  Skin: Skin is warm and dry.  Psychiatric: He has a normal mood and affect.    ED Course  Procedures (including critical care time)  Labs Reviewed - No data to display No results found. Results for orders placed during the hospital encounter of 09/04/12  CBC WITH DIFFERENTIAL      Component Value Range   WBC 7.7  4.0 - 10.5 K/uL   RBC 3.89 (*) 4.22 - 5.81 MIL/uL   Hemoglobin 11.7 (*) 13.0 - 17.0 g/dL   HCT 16.1 (*) 09.6 - 04.5 %   MCV 88.4  78.0 - 100.0 fL   MCH 30.1  26.0  - 34.0 pg   MCHC 34.0  30.0 - 36.0 g/dL   RDW 40.9  81.1 - 91.4 %   Platelets 208  150 - 400 K/uL   Neutrophils Relative 77  43 - 77 %   Neutro Abs 5.9  1.7 - 7.7 K/uL   Lymphocytes Relative 12  12 - 46 %   Lymphs Abs 1.0  0.7 - 4.0 K/uL   Monocytes Relative 10  3 - 12 %   Monocytes Absolute 0.7  0.1 - 1.0 K/uL   Eosinophils Relative 1  0 - 5 %   Eosinophils Absolute 0.1  0.0 - 0.7 K/uL   Basophils Relative 0  0 - 1 %   Basophils Absolute 0.0  0.0 - 0.1 K/uL  BASIC METABOLIC PANEL      Component Value Range   Sodium 131 (*) 135 - 145 mEq/L   Potassium 3.9  3.5 - 5.1 mEq/L   Chloride 95 (*) 96 - 112 mEq/L   CO2 27  19 - 32 mEq/L   Glucose, Bld 107 (*) 70 - 99 mg/dL   BUN 11  6 - 23 mg/dL   Creatinine, Ser 7.82  0.50 - 1.35 mg/dL   Calcium 8.9  8.4 - 95.6 mg/dL   GFR calc non Af Amer 87 (*) >90 mL/min   GFR calc Af Amer >90  >90 mL/min  TROPONIN I      Component Value Range   Troponin I 2.49 (*) <0.30 ng/mL  PRO B NATRIURETIC PEPTIDE      Component Value Range   Pro B Natriuretic peptide (BNP) 1762.0 (*) 0 - 125 pg/mL     Dg Chest 2 View (if Patient Has Fever And/or Copd)  09/04/2012  *RADIOLOGY REPORT*  Clinical Data: Cough and shortness of breath.  CHEST - 2 VIEW  Comparison: Chest x-ray 10/19/2011.  Findings: Lung volumes are low. There is cephalization of the pulmonary vasculature and slight indistinctness of the interstitial markings.  Trace bilateral pleural effusions.  Heart size is upper limits of normal.  Mediastinal contours are unremarkable. Atherosclerosis in the thoracic aorta.  Status post median sternotomy for CABG with a LIMA.  IMPRESSION: 1. Mild diffuse interstitial prominence, as above, suspicious for mild pulmonary edema.  There are also trace bilateral pleural effusions. 2.  Given the patient's symptoms, the possibility of an atypical  infection such as viral pneumonia warrants consideration (particularly given the absence of overt cardiomegaly).   Original Report  Authenticated By: Trudie Reed, M.D.     No diagnosis found.   Date: 09/04/2012  Rate: 83  Rhythm: normal sinus rhythm  QRS Axis: normal  Intervals: normal  ST/T Wave abnormalities: normal  Conduction Disutrbances: none  Narrative Interpretation: unremarkable  CRITICAL CARE Performed by: Donnetta Hutching   Total critical care time: 30  Critical care time was exclusive of separately billable procedures and treating other patients.  Critical care was necessary to treat or prevent imminent or life-threatening deterioration.  Critical care was time spent personally by me on the following activities: development of treatment plan with patient and/or surrogate as well as nursing, discussions with consultants, evaluation of patient's response to treatment, examination of patient, obtaining history from patient or surrogate, ordering and performing treatments and interventions, ordering and review of laboratory studies, ordering and review of radiographic studies, pulse oximetry and re-evaluation of patient's condition.   MDM   Multiple cardiac risk factors.  Normal EKG. Troponin elevated. BNP mildly elevated.  Will start heparin, aspirin, Lasix IV, consult to cardiology       Donnetta Hutching, MD 09/04/12 1211

## 2012-09-04 NOTE — ED Notes (Signed)
Report given to [facility]. 

## 2012-09-04 NOTE — Progress Notes (Signed)
ANTICOAGULATION CONSULT NOTE - Follow Up Consult  Pharmacy Consult for heparin Indication: chest pain/ACS  Allergies  Allergen Reactions  . Celecoxib Other (See Comments)    Per Mar  . Penicillins Other (See Comments)    Gi upset   . Sulfa Antibiotics Swelling    Patient Measurements: Height: 5' 8.11" (173 cm) (as documented 10/17/2011) Weight: 192 lb (87.091 kg) (pt stated that weight was at 192lbs ) IBW/kg (Calculated) : 68.65  Heparin Dosing Weight: 86kg Vital Signs: Temp: 98.1 F (36.7 C) (01/06 2013) Temp src: Oral (01/06 2013) BP: 123/63 mmHg (01/06 2013) Pulse Rate: 73  (01/06 2013)  Labs:  Basename 09/04/12 2117 09/04/12 2009 09/04/12 2008 09/04/12 1025  HGB -- -- 11.7* 11.7*  HCT -- -- 34.8* 34.4*  PLT -- -- 195 208  APTT -- -- -- 37  LABPROT -- -- -- 14.4  INR -- -- -- 1.14  HEPARINUNFRC 0.37 -- -- --  CREATININE -- -- 0.91 0.88  CKTOTAL -- -- -- --  CKMB -- -- -- --  TROPONINI -- 1.07* -- 2.49*    Estimated Creatinine Clearance: 84.8 ml/min (by C-G formula based on Cr of 0.91).   Medications:  Scheduled:    . [COMPLETED] albuterol  5 mg Nebulization Once  . amLODipine  7.5 mg Oral Daily  . aspirin  324 mg Oral NOW   Or  . aspirin  300 mg Rectal NOW  . aspirin EC  81 mg Oral Daily  . [COMPLETED] aspirin  325 mg Oral Once  . bisoprolol-hydrochlorothiazide  0.5 tablet Oral Daily  . cholecalciferol  2,000 Units Oral Daily  . dicyclomine  20 mg Oral TID AC & HS  . DULoxetine  90 mg Oral Daily  . [COMPLETED] furosemide  40 mg Intravenous Once  . furosemide  40 mg Intravenous BID  . [COMPLETED] heparin  4,000 Units Intravenous Once  . [COMPLETED] ipratropium  0.5 mg Nebulization Once  . irbesartan  300 mg Oral Daily  . multivitamin with minerals  1 tablet Oral Daily  . sodium chloride  3 mL Intravenous Q12H  . tobramycin-dexamethasone  1 drop Both Eyes QID  . [DISCONTINUED] ezetimibe-simvastatin  1 tablet Oral Daily  . [DISCONTINUED] heparin  60  Units/kg Intravenous Once  . [DISCONTINUED] Vitamin D  2,000 Units Oral Daily   Infusions:    . heparin 1,100 Units/hr (09/04/12 1852)    Assessment: 67 yo male with NSTEMI on heparin and at goal (HL= 0.37)  Goal of Therapy:  Heparin level 0.3-0.7 units/ml Monitor platelets by anticoagulation protocol: Yes   Plan:   -No heparin changes -Will recheck a heparin level in am  Harland German, Pharm D 09/04/2012 10:27 PM

## 2012-09-04 NOTE — ED Notes (Signed)
Pt in from [facility]. There for opiate detox. Reported cough, cold like symptoms to facility MD. Noted to have cough, shob, low O2 sats. 80% on RA. Up to 94% on 6L per ems. Ems bp 140/74, 74p, 20resp. Anxiety noted per ems as well. Recently taken off all anti anxiety meds.

## 2012-09-04 NOTE — H&P (Signed)
Hayden [name] is a 67 y.o. male  Admit date: 09/04/2012 Referring Physician: Donnetta [name], M.D. Primary Cardiologist:: Hayden [name], M.D. Chief complaint / reason for admission: Dyspnea  HPI: He is 67 years of age and has a substance dependency problem. Dr. Mayford [name] is his primary physician. He is living in a [facility] for opiate dependency and psychosocial support. He began developing nasal congestion, difficulty sleeping, orthopnea, cough and shortness of breath 2 days ago. There cough worse while lying down. He now has dyspnea on exertion. He has a history of coronary artery disease with prior bypass surgery. He is currently pain free. We were called by the emergency department because of elevated troponins. His EKG is nonacute.    PMH:    Past Medical History  Diagnosis Date  . PONV (postoperative nausea and vomiting)     pt prefers epidural is possible  . Impaired hearing     both ears  . Coronary artery disease   . Hypertension   . Myocardial infarction 10-31-1989  . H/O hiatal hernia   . Arthritis   . Bright's disease age 67  . Kidney stones     100  in past plus stones  . Frequent urination     PSH:    Past Surgical History  Procedure Date  . Coronary artery bypass graft [id number]    cabg x 3  . Nissen fundoplication 2006  . Lithortripsy     x 3  . Cystoscopy     10 to 12 times in past  . Lumbar laminectomy L 3 to L 4    Jul 01 1995  . Hernia repair 1998    with mesh  . Bilateral knee arthroscopy yrs ago  . Angioplasty 1991  . Cardiac catheterization 01-17-07  . Total knee arthroplasty 10/27/2011    Procedure: TOTAL KNEE ARTHROPLASTY;  Surgeon: Hayden Drilling, MD;  Location: [facility];  Service: Orthopedics;  Laterality: Right;    ALLERGIES:   Celecoxib; Penicillins; and Sulfa antibiotics  Prior to Admit Meds:   (Not in a hospital admission) Family HX:   No family history on file. Social HX:    History   Social History  . Marital Status: Married    Spouse  Name: N/A    Number of Children: N/A  . Years of Education: N/A   Occupational History  . Not on file.   Social History Main Topics  . Smoking status: Never Smoker   . Smokeless tobacco: Never Used  . Alcohol Use: No  . Drug Use: Yes     Comment: from [facility]-currently detoxing off opiates  . Sexually Active:    Other Topics Concern  . Not on file   Social History Narrative  . No narrative on file     ROS:  Denies headache, prior stroke, allergies to pollen and asthma. There is history of GI bleeding with fundoplication he denies chills, fever, weight loss, anorexia, hematuria, abdominal pain and other active complaints.  Physical Exam: Blood pressure 99/48, pulse 82, temperature 98.6 F (37 C), temperature source Oral, resp. rate 20, height 5' 8.11" (1.73 m), weight 87.091 kg (192 lb), SpO2 94.00%.   Changes appearing male who is in no distress. Skin color is normal.  HEENT exam reveals pupils are equal reactive. The patient appears somewhat anxious.  Chest is clear to auscultation and percussion.  Cardiac exam reveals no murmur, gallop, or rub.  Abdomen soft. No ascites is noted. No tenderness  is noted to  Extremities reveal no edema posterior tibial pulses are 2+. Radial pulses are 2+. Carotid pulses are 2+   Neurological exam again reveals an individual who is anxious with his wife at the bedside. He has no focal deficits. No tremor is noted to motor skills are normal Labs:   Lab Results  Component Value Date   WBC 7.7 09/04/2012   HGB 11.7* 09/04/2012   HCT 34.4* 09/04/2012   MCV 88.4 09/04/2012   PLT 208 09/04/2012    Lab 09/04/12 1025  NA 131*  K 3.9  CL 95*  CO2 27  BUN 11  CREATININE 0.88  CALCIUM 8.9  PROT --  BILITOT --  ALKPHOS --  ALT --  AST --  GLUCOSE 107*   Lab Results  Component Value Date   TROPONINI 2.49* 09/04/2012   BNP    Component Value Date/Time   PROBNP 1762.0* 09/04/2012 1025     Radiology:   CHEST - 2 VIEW  Comparison:  Chest x-ray 10/19/2011.  Findings: Lung volumes are low. There is cephalization of the  pulmonary vasculature and slight indistinctness of the interstitial  markings. Trace bilateral pleural effusions. Heart size is upper  limits of normal. Mediastinal contours are unremarkable.  Atherosclerosis in the thoracic aorta. Status post median  sternotomy for CABG with a LIMA.  IMPRESSION:  1. Mild diffuse interstitial prominence, as above, suspicious for  mild pulmonary edema. There are also trace bilateral pleural  effusions.  2. Given the patient's symptoms, the possibility of an atypical  infection such as viral pneumonia warrants consideration  (particularly given the absence of overt cardiomegaly).  Original Report Authenticated By: Hayden [name], M.D.   EKG:   normal sinus rhythm with no significant abnormality other than nonspecific T wave   ASSESSMENT: 1. Non-ST elevation myocardial infarction  2. Acute, presumed diastolic heart failure  3. Opiate detoxification, ongoing  4. History of coronary artery disease with lateral wall MI 1994 and 3 vessel CABG 2000  Plan:  1. Serial cardiac markers  2. IV Lasix  3. 2-D echo to assess LV function  4. May need coronary angiography to define anatomy and rule out bypass graft failure  5. May need social work consult with reference to opiate addiction and returning to Hayden [name] 09/04/2012 12:36 PM

## 2012-09-04 NOTE — Progress Notes (Signed)
ANTICOAGULATION CONSULT NOTE - Initial Consult  Pharmacy Consult for IV heparin Indication: ACS/STEMI  Allergies  Allergen Reactions  . Celecoxib Other (See Comments)    Per Mar  . Penicillins Other (See Comments)    Gi upset   . Sulfa Antibiotics Swelling    Patient Measurements: Height: 5' 8.11" (173 cm) (as documented 10/17/2011) Weight: 192 lb (87.091 kg) (pt stated that weight was at 192lbs ) IBW/kg (Calculated) : 68.65  Heparin Dosing Weight: 86 kg  Vital Signs: Temp: 98.6 F (37 C) (01/06 0929) Temp src: Oral (01/06 0929) BP: 99/48 mmHg (01/06 1100) Pulse Rate: 82  (01/06 1100)  Labs:  Basename 09/04/12 1025  HGB 11.7*  HCT 34.4*  PLT 208  APTT --  LABPROT --  INR --  HEPARINUNFRC --  CREATININE 0.88  CKTOTAL --  CKMB --  TROPONINI 2.49*    The CrCl is unknown because both a height and weight (above a minimum accepted value) are required for this calculation.   Medical History: Past Medical History  Diagnosis Date  . PONV (postoperative nausea and vomiting)     pt prefers epidural is possible  . Impaired hearing     both ears  . Coronary artery disease   . Hypertension   . Myocardial infarction 10-31-1989  . H/O hiatal hernia   . Arthritis   . Bright's disease age 1  . Kidney stones     100  in past plus stones  . Frequent urination     Assessment:  66 yom presented 1/6 with 2d history of SOB/cough. EKG in the ED normal but troponin elevated to 2.49 and proBNP also elevated. Patient with multiple cardiac risk factors, including h/o CAD with MI s/p CABG, HTN.  MD order for IV heparin to start for ACS/STEMI, consulting cardiology.  No baseline PT/INR, aPTT ordered.  Baseline CBC okay. No PTA anticoagulation other than ASA 81mg /day.  Goal of Therapy:  Heparin level 0.3-0.7 units/ml Monitor platelets by anticoagulation protocol: Yes   Plan:   STAT aPTT and PT/INR prior to initiation of IV heparin  Heparin 4000 units IV x 1 as bolus then  IV heparin 1100 units/hr (~13 units/kg/hr).    Heparin level at 1900 tonight  Daily heparin level and CBC  Pharmacy will f/u    Geoffry Paradise, PharmD, BCPS Pager: [phone] 12:16 PM Pharmacy #: [phone]

## 2012-09-05 LAB — CK TOTAL AND CKMB (NOT AT ARMC)
CK, MB: 3.8 ng/mL (ref 0.3–4.0)
Relative Index: INVALID (ref 0.0–2.5)

## 2012-09-05 LAB — HEPARIN LEVEL (UNFRACTIONATED)
Heparin Unfractionated: 0.2 IU/mL — ABNORMAL LOW (ref 0.30–0.70)
Heparin Unfractionated: 0.28 IU/mL — ABNORMAL LOW (ref 0.30–0.70)

## 2012-09-05 LAB — LIPID PANEL
Cholesterol: 145 mg/dL (ref 0–200)
Total CHOL/HDL Ratio: 4.5 RATIO

## 2012-09-05 LAB — TSH: TSH: 4.353 u[IU]/mL (ref 0.350–4.500)

## 2012-09-05 LAB — TROPONIN I: Troponin I: 1.58 ng/mL (ref <0.30)

## 2012-09-05 LAB — HEMOGLOBIN A1C: Mean Plasma Glucose: 143 mg/dL — ABNORMAL HIGH (ref <117)

## 2012-09-05 MED ORDER — HEPARIN SOD (PORCINE) IN D5W 100 UNIT/ML IV SOLN
INTRAVENOUS | Status: AC
  Filled 2012-09-05: qty 250

## 2012-09-05 NOTE — Progress Notes (Signed)
ANTICOAGULATION CONSULT NOTE - Follow Up Consult  Pharmacy Consult for heparin Indication: chest pain/ACS  Patient Measurements: Height: 5' 8.11" (173 cm) (as documented 10/17/2011) Weight: 185 lb 6.5 oz (84.1 kg) IBW/kg (Calculated) : 68.65  Heparin Dosing Weight: 86kg Vital Signs: Temp: 97.9 F (36.6 C) (01/07 0745) Temp src: Oral (01/07 0745) BP: 113/69 mmHg (01/07 0745) Pulse Rate: 74  (01/07 0745)  Labs:  Alvira Philips 09/05/12 0932 09/05/12 0847 09/05/12 0139 09/04/12 2117 09/04/12 2009 09/04/12 2008 09/04/12 1025  HGB -- -- -- -- -- 11.7* 11.7*  HCT -- -- -- -- -- 34.8* 34.4*  PLT -- -- -- -- -- 195 208  APTT -- -- -- -- -- -- 37  LABPROT -- -- -- -- -- -- 14.4  INR -- -- -- -- -- -- 1.14  HEPARINUNFRC 0.20* -- -- 0.37 -- -- --  CREATININE -- -- -- -- -- 0.91 0.88  CKTOTAL -- 85 -- -- -- -- --  CKMB -- 3.8 -- -- -- -- --  TROPONINI -- 0.95* 1.58* -- 1.07* -- --    Estimated Creatinine Clearance: 83.5 ml/min (by C-G formula based on Cr of 0.91).   Medications:  Scheduled:     . amLODipine  7.5 mg Oral Daily  . aspirin  324 mg Oral NOW   Or  . aspirin  300 mg Rectal NOW  . aspirin EC  81 mg Oral Daily  . [COMPLETED] aspirin  325 mg Oral Once  . bisoprolol-hydrochlorothiazide  0.5 tablet Oral Daily  . cholecalciferol  2,000 Units Oral Daily  . dicyclomine  20 mg Oral TID AC & HS  . DULoxetine  90 mg Oral Daily  . [COMPLETED] furosemide  40 mg Intravenous Once  . furosemide  40 mg Intravenous BID  . [COMPLETED] heparin      . [COMPLETED] heparin  4,000 Units Intravenous Once  . irbesartan  300 mg Oral Daily  . multivitamin with minerals  1 tablet Oral Daily  . sodium chloride  3 mL Intravenous Q12H  . tobramycin-dexamethasone  1 drop Both Eyes QID  . [DISCONTINUED] ezetimibe-simvastatin  1 tablet Oral Daily  . [DISCONTINUED] heparin  60 Units/kg Intravenous Once  . [DISCONTINUED] Vitamin D  2,000 Units Oral Daily   Infusions:     . heparin 1,100 Units/hr  (09/05/12 9562)    Assessment: 67 yo male with NSTEMI on heparin, repeat level below goal (HL= 0.2). No bleeding issues noted. Echo done this am, will follow plan for possible cath.  Goal of Therapy:  Heparin level 0.3-0.7 units/ml Monitor platelets by anticoagulation protocol: Yes   Plan:   -Increase heparin to 1300/hr -Will recheck a heparin level tonight  Sheppard Coil, Pharm D 09/05/2012 11:06 AM

## 2012-09-05 NOTE — Progress Notes (Signed)
*  PRELIMINARY RESULTS* Echocardiogram 2D Echocardiogram has been performed.  Hayden [name] 09/05/2012, 10:18 AM

## 2012-09-05 NOTE — Progress Notes (Signed)
ANTICOAGULATION CONSULT NOTE - Follow Up Consult  Pharmacy Consult for heparin Indication: chest pain/ACS  Patient Measurements: Height: 5' 8.11" (173 cm) (as documented 10/17/2011) Weight: 185 lb 6.5 oz (84.1 kg) IBW/kg (Calculated) : 68.65  Heparin Dosing Weight: 86kg Vital Signs: Temp: 98 F (36.7 C) (01/07 1600) Temp src: Oral (01/07 1600) BP: 102/63 mmHg (01/07 1600) Pulse Rate: 71  (01/07 1600)  Labs:  Basename 09/05/12 1736 09/05/12 0932 09/05/12 0847 09/05/12 0139 09/04/12 2117 09/04/12 2009 09/04/12 2008 09/04/12 1025  HGB -- -- -- -- -- -- 11.7* 11.7*  HCT -- -- -- -- -- -- 34.8* 34.4*  PLT -- -- -- -- -- -- 195 208  APTT -- -- -- -- -- -- -- 37  LABPROT -- -- -- -- -- -- -- 14.4  INR -- -- -- -- -- -- -- 1.14  HEPARINUNFRC 0.28* 0.20* -- -- 0.37 -- -- --  CREATININE -- -- -- -- -- -- 0.91 0.88  CKTOTAL -- -- 85 -- -- -- -- --  CKMB -- -- 3.8 -- -- -- -- --  TROPONINI -- -- 0.95* 1.58* -- 1.07* -- --    Estimated Creatinine Clearance: 83.5 ml/min (by C-G formula based on Cr of 0.91).   Medications:  Scheduled:     . amLODipine  7.5 mg Oral Daily  . [EXPIRED] aspirin  324 mg Oral NOW   Or  . [EXPIRED] aspirin  300 mg Rectal NOW  . aspirin EC  81 mg Oral Daily  . bisoprolol-hydrochlorothiazide  0.5 tablet Oral Daily  . cholecalciferol  2,000 Units Oral Daily  . dicyclomine  20 mg Oral TID AC & HS  . DULoxetine  90 mg Oral Daily  . furosemide  40 mg Intravenous BID  . [COMPLETED] heparin      . irbesartan  300 mg Oral Daily  . multivitamin with minerals  1 tablet Oral Daily  . sodium chloride  3 mL Intravenous Q12H  . tobramycin-dexamethasone  1 drop Both Eyes QID   Infusions:     . heparin 1,300 Units/hr (09/05/12 1108)    Assessment: 67 yo male with NSTEMI on heparin, repeat level still slightly below goal (HL= 0.28) after rate increase. No bleeding issues noted. Echo done this am, will follow plan for possible cath.  Goal of Therapy:  Heparin  level 0.3-0.7 units/ml Monitor platelets by anticoagulation protocol: Yes   Plan:   -Increase heparin to 1450 units/hr - f/u am heparin level  Bayard Hugger, PharmD, BCPS  Clinical Pharmacist  Pager: [phone]  09/05/2012 7:09 PM

## 2012-09-05 NOTE — Progress Notes (Signed)
Utilization Review Completed.,  T1/02/2013   

## 2012-09-05 NOTE — Progress Notes (Signed)
SUBJECTIVE:  Feels much better today  OBJECTIVE:   Vitals:   Filed Vitals:   09/04/12 2347 09/05/12 0401 09/05/12 0500 09/05/12 0745  BP: 109/62 109/54  113/69  Pulse: 73 81  74  Temp: 97.3 F (36.3 C) 98.4 F (36.9 C)  97.9 F (36.6 C)  TempSrc: Oral Oral  Oral  Resp: 16 18  18   Height:      Weight:   84.1 kg (185 lb 6.5 oz)   SpO2: 96% 96%  94%   I&O's:   Intake/Output Summary (Last 24 hours) at 09/05/12 0841 Last data filed at 09/05/12 0745  Gross per 24 hour  Intake    162 ml  Output   2215 ml  Net  -2053 ml   TELEMETRY: Reviewed telemetry pt in NSR     PHYSICAL EXAM General: Well developed, well nourished, in no acute distress Head: Eyes PERRLA, No xanthomas.   Normal cephalic and atramatic  Lungs:   Few crackles at left base Heart:   HRRR S1 S2 Pulses are 2+ & equal. Abdomen: Bowel sounds are positive, abdomen soft and non-tender without masses Extremities:   No clubbing, cyanosis or edema.  DP +1 Neuro: Alert and oriented X 3. Psych:  Good affect, responds appropriately   LABS: Basic Metabolic Panel:  Basename 09/04/12 2008 09/04/12 1025  NA 135 131*  K 4.2 3.9  CL 98 95*  CO2 29 27  GLUCOSE 141* 107*  BUN 12 11  CREATININE 0.91 0.88  CALCIUM 9.1 8.9  MG 1.8 --  PHOS -- --   Liver Function Tests:  [facility] 09/04/12 2008  AST 29  ALT 10  ALKPHOS 55  BILITOT 0.4  PROT 6.3  ALBUMIN 3.1*   No results found for this basename: LIPASE:2,AMYLASE:2 in the last 72 hours CBC:  Basename 09/04/12 2008 09/04/12 1025  WBC 7.2 7.7  NEUTROABS 4.7 5.9  HGB 11.7* 11.7*  HCT 34.8* 34.4*  MCV 89.5 88.4  PLT 195 208   Cardiac Enzymes:  Basename 09/05/12 0139 09/04/12 2009 09/04/12 1025  CKTOTAL -- -- --  CKMB -- -- --  CKMBINDEX -- -- --  TROPONINI 1.58* 1.07* 2.49*   BNP: No components found with this basename: POCBNP:3 D-Dimer: No results found for this basename: DDIMER:2 in the last 72 hours Hemoglobin A1C:  Basename 09/04/12 2008    HGBA1C 6.6*   Fasting Lipid Panel:  Basename 09/05/12 0139  CHOL 145  HDL 32*  LDLCALC 93  TRIG 161  CHOLHDL 4.5  LDLDIRECT --   Thyroid Function Tests:  Basename 09/04/12 2008  TSH 4.353  T4TOTAL --  T3FREE --  THYROIDAB --   Anemia Panel: No results found for this basename: VITAMINB12,FOLATE,FERRITIN,TIBC,IRON,RETICCTPCT in the last 72 hours Coag Panel:   Lab Results  Component Value Date   INR 1.14 09/04/2012   INR 0.96 10/19/2011   INR 0.93 07/16/2010    RADIOLOGY: Dg Chest 2 View (if Patient Has Fever And/or Copd)  09/04/2012  *RADIOLOGY REPORT*  Clinical Data: Cough and shortness of breath.  CHEST - 2 VIEW  Comparison: Chest x-ray 10/19/2011.  Findings: Lung volumes are low. There is cephalization of the pulmonary vasculature and slight indistinctness of the interstitial markings.  Trace bilateral pleural effusions.  Heart size is upper limits of normal.  Mediastinal contours are unremarkable. Atherosclerosis in the thoracic aorta.  Status post median sternotomy for CABG with a LIMA.  IMPRESSION: 1. Mild diffuse interstitial prominence, as above, suspicious for mild pulmonary edema.  There are also trace bilateral pleural effusions. 2.  Given the patient's symptoms, the possibility of an atypical infection such as viral pneumonia warrants consideration (particularly given the absence of overt cardiomegaly).   Original Report Authenticated By: Trudie Reed, M.D.       ASSESSMENT/PLAN:  1. Non-ST elevation myocardial infarction - he states that he had exertional chest tightness when he had his CABG which he has not had.  The elevated troponin could be from CHF.  I will check an echo to eval LVF and also get a CPK and MB.  May still need cath. 2. Acute, presumed diastolic heart failure - diuresed well overnight - 2D echo pending.  Will continue IV Lasix today.  He has been in detox at [facility] and said that the food is very salty. 3. Opiate detoxification, ongoing   4. History of coronary artery disease with lateral wall MI 1994 and 3 vessel CABG 2000     Quintella Reichert, MD  09/05/2012  8:41 AM

## 2012-09-06 ENCOUNTER — Inpatient Hospital Stay (HOSPITAL_COMMUNITY): Payer: 59

## 2012-09-06 LAB — CBC
Hemoglobin: 12 g/dL — ABNORMAL LOW (ref 13.0–17.0)
MCHC: 32.5 g/dL (ref 30.0–36.0)
Platelets: 250 10*3/uL (ref 150–400)
RDW: 13.9 % (ref 11.5–15.5)

## 2012-09-06 LAB — HEPARIN LEVEL (UNFRACTIONATED): Heparin Unfractionated: 0.42 IU/mL (ref 0.30–0.70)

## 2012-09-06 MED ORDER — TECHNETIUM TC 99M SESTAMIBI GENERIC - CARDIOLITE
30.0000 | Freq: Once | INTRAVENOUS | Status: AC | PRN
  Administered 2012-09-06: 30 via INTRAVENOUS

## 2012-09-06 MED ORDER — TECHNETIUM TC 99M SESTAMIBI GENERIC - CARDIOLITE
10.0000 | Freq: Once | INTRAVENOUS | Status: AC | PRN
  Administered 2012-09-06: 10 via INTRAVENOUS

## 2012-09-06 MED ORDER — FUROSEMIDE 20 MG PO TABS: 20.0000 mg | ORAL_TABLET | Freq: Every day | ORAL | Status: DC

## 2012-09-06 MED ORDER — BISOPROLOL FUMARATE 5 MG PO TABS: ORAL_TABLET | ORAL | Status: DC

## 2012-09-06 NOTE — Progress Notes (Signed)
ANTICOAGULATION CONSULT NOTE - Follow Up Consult  Pharmacy Consult for heparin Indication: chest pain/ACS  Labs:  Basename 09/06/12 0435 09/05/12 1736 09/05/12 0932 09/05/12 0847 09/05/12 0139 09/04/12 2009 09/04/12 2008 09/04/12 1025  HGB 12.0* -- -- -- -- -- 11.7* --  HCT 36.9* -- -- -- -- -- 34.8* 34.4*  PLT 250 -- -- -- -- -- 195 208  APTT -- -- -- -- -- -- -- 37  LABPROT -- -- -- -- -- -- -- 14.4  INR -- -- -- -- -- -- -- 1.14  HEPARINUNFRC 0.42 0.28* 0.20* -- -- -- -- --  CREATININE -- -- -- -- -- -- 0.91 0.88  CKTOTAL -- -- -- 85 -- -- -- --  CKMB -- -- -- 3.8 -- -- -- --  TROPONINI -- -- -- 0.95* 1.58* 1.07* -- --    Assessment/Plan:  67yo male now therapeutic on heparin after rate increases.  Will continue gtt at current rate and confirm stable with additional level.  Colleen Can PharmD BCPS 09/06/2012,6:09 AM

## 2012-09-06 NOTE — Discharge Summary (Signed)
Patient ID: Hayden [name] MRN: [id number] DOB/AGE: 1946-06-29 67 y.o.  Admit date: 09/04/2012 Discharge date: 09/06/2012  Primary Discharge Diagnosis  Acute diastolic CHF Secondary Discharge Diagnosis  NSTEMI secondary to demand ischemia from CHF  CAD s/p remote CABG  HTN  H/o Hiatal Hernia  Arthritis  Bright's disese  Kidney stones  Impaired hearing   Consults: None  Hospital Course:  Hayden [name] is 67 years of age and has a substance dependency problem.  He is living in a [facility] for opiate dependency and psychosocial support. He began developing nasal congestion, difficulty sleeping, orthopnea, cough and shortness of breath 2 days prior to admission. The  cough was worse while lying down. He then started  having dyspnea on exertion. He has a history of coronary artery disease with prior bypass surgery. He presented to the ER and was diagnosed with acute diastolic CHF.  He was admitted and diuresed with IV Lasix.  He did note that since being in [facility] the food they have been serving is high in sodium.  His Troponin was mildly elevated but CPK and MB were normal c/w demand ischemia from CHF.  Ah echo showed low normal LVF EF 50-55%.  He said that with his CAD in the past he had chest pressure and has not had anything like that since his CABG.  His LE edema resolved and on the day of discharge he was ambulating without SOB.  He underwent Stress myoview on day of discharge showing a fixed septal defect with questionable small area of ischemia EF 47%.  He had not had any chest pain so he was sent home in stable condition on medical therapy.     Discharge Exam: Blood pressure 104/62, pulse 63, temperature 97.8 F (36.6 C), temperature source Oral, resp. rate 18, height 5' 8.11" (1.73 m), weight 82.6 kg (182 lb 1.6 oz), SpO2 94.00%.   General appearance: alert Resp: clear to auscultation bilaterally Cardio: regular rate and rhythm, S1, S2 normal, no murmur, click, rub or  gallop GI: soft, non-tender; bowel sounds normal; no masses,  no organomegaly Extremities: extremities normal, atraumatic, no cyanosis or edema Labs:   Lab Results  Component Value Date   WBC 4.8 09/06/2012   HGB 12.0* 09/06/2012   HCT 36.9* 09/06/2012   MCV 88.9 09/06/2012   PLT 250 09/06/2012    Lab 09/04/12 2008  NA 135  K 4.2  CL 98  CO2 29  BUN 12  CREATININE 0.91  CALCIUM 9.1  PROT 6.3  BILITOT 0.4  ALKPHOS 55  ALT 10  AST 29  GLUCOSE 141*   Lab Results  Component Value Date   CKTOTAL 85 09/05/2012   CKMB 3.8 09/05/2012   TROPONINI 0.95* 09/05/2012    Lab Results  Component Value Date   CHOL 145 09/05/2012   Lab Results  Component Value Date   HDL 32* 09/05/2012   Lab Results  Component Value Date   LDLCALC 93 09/05/2012   Lab Results  Component Value Date   TRIG 102 09/05/2012   Lab Results  Component Value Date   CHOLHDL 4.5 09/05/2012   No results found for this basename: LDLDIRECT      Radiology:  *RADIOLOGY REPORT*  Clinical Data: Cough and shortness of breath.  CHEST - 2 VIEW  Comparison: Chest x-ray 10/19/2011.  Findings: Lung volumes are low. There is cephalization of the  pulmonary vasculature and slight indistinctness of the interstitial  markings. Trace bilateral pleural effusions. Heart size  is upper  limits of normal. Mediastinal contours are unremarkable.  Atherosclerosis in the thoracic aorta. Status post median  sternotomy for CABG with a LIMA.  IMPRESSION:  1. Mild diffuse interstitial prominence, as above, suspicious for  mild pulmonary edema. There are also trace bilateral pleural  effusions.  2. Given the patient's symptoms, the possibility of an atypical  infection such as viral pneumonia warrants consideration  (particularly given the absence of overt cardiomegaly).  Original Report Authenticated By: Trudie Reed, M.D.  EKG:  NSR with occasional PVC's and nonspecific T wave abnormality  FOLLOW UP PLANS AND APPOINTMENTS Discharge  Orders    Future Orders Please Complete By Expires   Diet - low sodium heart healthy      Increase activity slowly          Medication List     As of 09/06/2012  8:46 AM    STOP taking these medications         bisoprolol-hydrochlorothiazide 5-6.25 MG per tablet   Commonly known as: ZIAC      TAKE these medications         albuterol 108 (90 BASE) MCG/ACT inhaler   Commonly known as: PROVENTIL HFA;VENTOLIN HFA   Inhale 2 puffs into the lungs every 6 (six) hours as needed. For shortness of breath.      amLODipine 5 MG tablet   Commonly known as: NORVASC   Take 7.5 mg by mouth daily.      aspirin EC 81 MG tablet   Take 81 mg by mouth daily.      bisoprolol 5 MG tablet   Commonly known as: ZEBETA   Take 1/2 tablet daily      dicyclomine 20 MG tablet   Commonly known as: BENTYL   Take 20 mg by mouth 4 (four) times daily as needed. For cramping.      DULoxetine 30 MG capsule   Commonly known as: CYMBALTA   Take 90 mg by mouth daily.      ezetimibe-simvastatin 10-20 MG per tablet   Commonly known as: VYTORIN   Take 1 tablet by mouth daily.      furosemide 20 MG tablet   Commonly known as: LASIX   Take 1 tablet (20 mg total) by mouth daily.      ibuprofen 600 MG tablet   Commonly known as: ADVIL,MOTRIN   Take 600 mg by mouth every 6 (six) hours as needed. For pain/fever      methocarbamol 500 MG tablet   Commonly known as: ROBAXIN   Take 1,000 mg by mouth 4 (four) times daily as needed. For muscle cramping      multivitamin with minerals Tabs   Take 1 tablet by mouth daily.      nitroGLYCERIN 0.4 MG SL tablet   Commonly known as: NITROSTAT   Place 0.4 mg under the tongue every 5 (five) minutes as needed. For chest pain.      olmesartan 40 MG tablet   Commonly known as: BENICAR   Take 40 mg by mouth daily.      SYSTANE ULTRA OP   Apply 1 drop to eye as needed. For eye irritation      tobramycin-dexamethasone ophthalmic solution   Commonly known as:  TOBRADEX   Place 1 drop into both eyes 4 (four) times daily.      Vitamin D 2000 UNITS tablet   Take 2,000 Units by mouth daily.  Follow-up Information    Follow up with FERGUSON,CYNTHIA A, NP. On 09/20/2012. (at 9:30am)    Contact information:   [facility] [address], [address] [place] [address] [phone]          BRING ALL MEDICATIONS WITH YOU TO FOLLOW UP APPOINTMENTS  Time spent with patient to include physician time:  30 minutes Signed: TURNER,TRACI R 09/06/2012, 8:46 AM

## 2012-09-06 NOTE — Progress Notes (Signed)
SUBJECTIVE:  No complaints - wants to go home  OBJECTIVE:   Vitals:   Filed Vitals:   09/06/12 0400 09/06/12 0452 09/06/12 0804 09/06/12 0809  BP:   104/62   Pulse:    63  Temp: 98 F (36.7 C)   97.8 F (36.6 C)  TempSrc:    Oral  Resp:      Height:      Weight:  82.6 kg (182 lb 1.6 oz)    SpO2: 92%   94%   I&O's:   Intake/Output Summary (Last 24 hours) at 09/06/12 0837 Last data filed at 09/06/12 0700  Gross per 24 hour  Intake 452.18 ml  Output   1500 ml  Net -1047.82 ml   TELEMETRY: Reviewed telemetry pt in NSR:     PHYSICAL EXAM General: Well developed, well nourished, in no acute distress Head: Eyes PERRLA, No xanthomas.   Normal cephalic and atramatic  Lungs:   Clear bilaterally to auscultation and percussion. Heart:   HRRR S1 S2 Pulses are 2+ & equal. Abdomen: Bowel sounds are positive, abdomen soft and non-tender without masses Extremities:   No clubbing, cyanosis or edema.  DP +1 Neuro: Alert and oriented X 3. Psych:  Good affect, responds appropriately   LABS: Basic Metabolic Panel:  Basename 09/04/12 2008 09/04/12 1025  NA 135 131*  K 4.2 3.9  CL 98 95*  CO2 29 27  GLUCOSE 141* 107*  BUN 12 11  CREATININE 0.91 0.88  CALCIUM 9.1 8.9  MG 1.8 --  PHOS -- --   Liver Function Tests:  [facility] 09/04/12 2008  AST 29  ALT 10  ALKPHOS 55  BILITOT 0.4  PROT 6.3  ALBUMIN 3.1*   No results found for this basename: LIPASE:2,AMYLASE:2 in the last 72 hours CBC:  Basename 09/06/12 0435 09/04/12 2008 09/04/12 1025  WBC 4.8 7.2 --  NEUTROABS -- 4.7 5.9  HGB 12.0* 11.7* --  HCT 36.9* 34.8* --  MCV 88.9 89.5 --  PLT 250 195 --   Cardiac Enzymes:  Basename 09/05/12 0847 09/05/12 0139 09/04/12 2009  CKTOTAL 85 -- --  CKMB 3.8 -- --  CKMBINDEX -- -- --  TROPONINI 0.95* 1.58* 1.07*   BNP: No components found with this basename: POCBNP:3 D-Dimer: No results found for this basename: DDIMER:2 in the last 72 hours Hemoglobin A1C:  Basename  09/04/12 2008  HGBA1C 6.6*   Fasting Lipid Panel:  Basename 09/05/12 0139  CHOL 145  HDL 32*  LDLCALC 93  TRIG 161  CHOLHDL 4.5  LDLDIRECT --   Thyroid Function Tests:  Basename 09/04/12 2008  TSH 4.353  T4TOTAL --  T3FREE --  THYROIDAB --   Anemia Panel: No results found for this basename: VITAMINB12,FOLATE,FERRITIN,TIBC,IRON,RETICCTPCT in the last 72 hours Coag Panel:   Lab Results  Component Value Date   INR 1.14 09/04/2012   INR 0.96 10/19/2011   INR 0.93 07/16/2010    RADIOLOGY: Dg Chest 2 View (if Patient Has Fever And/or Copd)  09/04/2012  *RADIOLOGY REPORT*  Clinical Data: Cough and shortness of breath.  CHEST - 2 VIEW  Comparison: Chest x-ray 10/19/2011.  Findings: Lung volumes are low. There is cephalization of the pulmonary vasculature and slight indistinctness of the interstitial markings.  Trace bilateral pleural effusions.  Heart size is upper limits of normal.  Mediastinal contours are unremarkable. Atherosclerosis in the thoracic aorta.  Status post median sternotomy for CABG with a LIMA.  IMPRESSION: 1. Mild diffuse interstitial prominence, as above, suspicious  for mild pulmonary edema.  There are also trace bilateral pleural effusions. 2.  Given the patient's symptoms, the possibility of an atypical infection such as viral pneumonia warrants consideration (particularly given the absence of overt cardiomegaly).   Original Report Authenticated By: Trudie Reed, M.D.    ASSESSMENT/PLAN:  1. Non-ST elevation myocardial infarction - he states that he had exertional chest tightness when he had his CABG which he has not had. The elevated troponin is most likely secondary to CHF.  CPK and MB are normal.  2D echo showed low normal LVF with possible mild basal anteroseptal HK.  Will plane Stress Cardiolyte this am.  If no ischemia then d/c home. 2. Acute diastolic heart failure - resolved  3. Opiate detoxification, ongoing  4. History of coronary artery disease with  lateral wall MI 1994 and 3 vessel CABG 2000        Quintella Reichert, MD  09/06/2012  8:37 AM

## 2012-12-19 ENCOUNTER — Ambulatory Visit
Admission: RE | Admit: 2012-12-19 | Discharge: 2012-12-19 | Disposition: A | Discharge status: Home / Self Care | Payer: 59 | Source: Ambulatory Visit | Attending: Family Medicine | Admitting: Family Medicine

## 2012-12-19 ENCOUNTER — Other Ambulatory Visit: Payer: Self-pay | Admitting: Family Medicine

## 2012-12-19 DIAGNOSIS — R05 Cough: Secondary | ICD-10-CM

## 2013-01-18 ENCOUNTER — Encounter: Payer: Self-pay | Admitting: Internal Medicine

## 2013-01-19 ENCOUNTER — Encounter: Payer: Self-pay | Admitting: Internal Medicine

## 2013-01-19 ENCOUNTER — Ambulatory Visit (INDEPENDENT_AMBULATORY_CARE_PROVIDER_SITE_OTHER): Payer: 59 | Admitting: Internal Medicine

## 2013-01-19 VITALS — BP 130/78 | HR 68 | Temp 97.6°F | Ht 68.25 in | Wt 192.2 lb

## 2013-01-19 DIAGNOSIS — R05 Cough: Secondary | ICD-10-CM

## 2013-01-19 DIAGNOSIS — R059 Cough, unspecified: Secondary | ICD-10-CM

## 2013-01-19 MED ORDER — PANTOPRAZOLE SODIUM 40 MG PO TBEC: 40.0000 mg | DELAYED_RELEASE_TABLET | Freq: Every day | ORAL | Status: DC

## 2013-01-19 MED ORDER — PREDNISONE (PAK) 10 MG PO TABS: ORAL_TABLET | ORAL | Status: DC

## 2013-01-19 MED ORDER — FAMOTIDINE 20 MG PO TABS: ORAL_TABLET | ORAL | Status: DC

## 2013-01-19 NOTE — Progress Notes (Signed)
Subjective:    Patient ID: Hayden [name], male    DOB: [birth year]  MRN: [id number]  HPI  76 yowm never smoker with year round sense of nasal drainage since around 1980's year round seems worse in spring in fall some better with otc's then cough onset Jan 2014 referred to pulmonary clinic 01/19/2013 by Dr Wynelle Link for refractory cough.  01/19/2013 pulmonary ov cc abrupt onset Sep 04 2012  during w/d alcohol and drug stay at [facility] and cough is fine at night but after stir in am starts coughing again daily ever since Jan 6 > non productive rx with prednisone and abx and some better but still with speaking on phone cough worse and constantly aware needs to clear his throat    Kouffman Reflux v Neurogenic Cough Differentiator Reflux Comments  Do you awaken from a sound sleep coughing violently?                            With trouble breathing? no   Do you have choking episodes when you cannot  Get enough air, gasping for air ?              no   Do you usually cough when you lie down into  The bed, or when you just lie down to rest ?                          no   Do you usually cough after meals or eating?         no   Do you cough when (or after) you bend over?    no   GERD SCORE     Kouffman Reflux v Neurogenic Cough Differentiator Neurogenic   Do you more-or-less cough all day long? yes   Does change of temperature make you cough? yes   Does laughing or chuckling cause you to cough? yes   Do fumes (perfume, automobile fumes, burned  Toast, etc.,) cause you to cough ?      maybe   Does speaking, singing, or talking on the phone cause you to cough   ?               yes   Neurogenic/Airway score     No obvious daytime variabilty or assoc chronic cough or cp or chest tightness, subjective wheeze overt sinus (not like his usual rhinitis symptoms) or hb symptoms. No unusual exp hx or h/o childhood pna/ asthma or premature birth to his knowledge.   Sleeping ok without nocturnal  or early am  exacerbation  of respiratory  c/o's or need for noct saba. Also denies any obvious fluctuation of symptoms with weather or environmental changes or other aggravating or alleviating factors except as outlined above   .   Review of Systems  Constitutional: Negative for fever, chills, activity change, appetite change and unexpected weight change.  HENT: Negative for congestion, sore throat, rhinorrhea, sneezing, trouble swallowing, dental problem, voice change and postnasal drip.   Eyes: Negative for visual disturbance.  Respiratory: Positive for cough. Negative for choking and shortness of breath.   Cardiovascular: Negative for chest pain and leg swelling.  Gastrointestinal: Negative for nausea, vomiting and abdominal pain.  Genitourinary: Negative for difficulty urinating.  Musculoskeletal: Positive for arthralgias.  Skin: Negative for rash.  Psychiatric/Behavioral: Negative for behavioral problems and confusion.       Objective:  Physical Exam  amb wm nad with freq throat clearing Wt Readings from Last 3 Encounters:  01/19/13 192 lb 3.2 oz (87.181 kg)  09/06/12 182 lb 1.6 oz (82.6 kg)  10/27/11 183 lb (83.008 kg)    HEENT: nl dentition, turbinates, and orophanx. Nl external ear canals without cough reflex   NECK :  without JVD/Nodes/TM/ nl carotid upstrokes bilaterally   LUNGS: no acc muscle use, clear to A and P bilaterally without cough on insp or exp maneuvers   CV:  RRR  no s3 or murmur or increase in P2, no edema   ABD:  soft and nontender with nl excursion in the supine position. No bruits or organomegaly, bowel sounds nl  MS:  warm without deformities, calf tenderness, cyanosis or clubbing  SKIN: warm and dry without lesions    NEURO:  alert, approp, no deficits    12/19/12 Cxr  No acute cardiopulmonary disease.     Assessment & Plan:

## 2013-01-19 NOTE — Patient Instructions (Addendum)
Chlortrimeton 4 mg every 6 hours as needed for throat drainage   Pantoprazole (protonix) 40 mg   Take 30-60 min before first meal of the day and Pepcid 20 mg one bedtime until return to office - this is the best way to tell whether stomach acid is contributing to your problem.   GERD (REFLUX)  is an extremely common cause of respiratory symptoms, many times with no significant heartburn at all.    It can be treated with medication, but also with lifestyle changes including avoidance of late meals, excessive alcohol, smoking cessation, and avoid fatty foods, chocolate, peppermint, colas, red wine, and acidic juices such as orange juice.  NO MINT OR MENTHOL PRODUCTS SO NO COUGH DROPS  USE SUGARLESS CANDY INSTEAD (jolley ranchers or Stover's)  NO OIL BASED VITAMINS - use powdered substitutes.   Best cough medication is delsym 2 tsp every 12 hours as needed  Prednisone 10 mg take  4 each am x 2 days,   2 each am x 2 days,  1 each am x 2 days and stop   Please schedule a follow up office visit in 4 weeks, sooner if needed

## 2013-01-21 DIAGNOSIS — R05 Cough: Secondary | ICD-10-CM | POA: Insufficient documentation

## 2013-01-21 NOTE — Assessment & Plan Note (Signed)
The most common causes of chronic cough in immunocompetent adults include the following: upper airway cough syndrome (UACS), previously referred to as postnasal drip syndrome (PNDS), which is caused by variety of rhinosinus conditions; (2) asthma; (3) GERD; (4) chronic bronchitis from cigarette smoking or other inhaled environmental irritants; (5) nonasthmatic eosinophilic bronchitis; and (6) bronchiectasis.   These conditions, singly or in combination, have accounted for up to 94% of the causes of chronic cough in prospective studies.   Other conditions have constituted no >6% of the causes in prospective studies These have included bronchogenic carcinoma, chronic interstitial pneumonia, sarcoidosis, left ventricular failure, ACEI-induced cough, and aspiration from a condition associated with pharyngeal dysfunction.   .Chronic cough is often simultaneously caused by more than one condition. A single cause has been found from 38 to 82% of the time, multiple causes from 18 to 62%. Multiply caused cough has been the result of three diseases up to 42% of the time.    Of the three most common causes of chronic cough, only one (GERD)  can actually cause the other two (asthma and post nasal drip syndrome)  and perpetuate the cylce of cough inducing airway trauma, inflammation, heightened sensitivity to reflux which is prompted by the cough itself via a cyclical mechanism.    This may partially respond to steroids and look like asthma and post nasal drainage but never erradicated completely unless the cough and the secondary reflux are eliminated, preferably both at the same time.  While not intuitively obvious, many patients with chronic low grade reflux do not cough until there is a secondary insult that disturbs the protective epithelial barrier and exposes sensitive nerve endings.  This can be viral or direct physical injury such as with an endotracheal tube.   The point is that once this occurs, it is  difficult to eliminate using anything but a maximally effective acid suppression regimen at least in the short run, accompanied by an appropriate diet to address non acid GERD.    See instructions for specific recommendations which were reviewed directly with the patient who was given a copy with highlighter outlining the key components.  

## 2013-02-16 IMAGING — CR DG CHEST 2V
2 series · 2 of 2 positions shown · non-contrast
Comparison: 07/16/2010

CLINICAL DATA: History of coronary bypass, preoperative evaluation
for knee replacement

CHEST - 2 VIEW

[w chest pa]
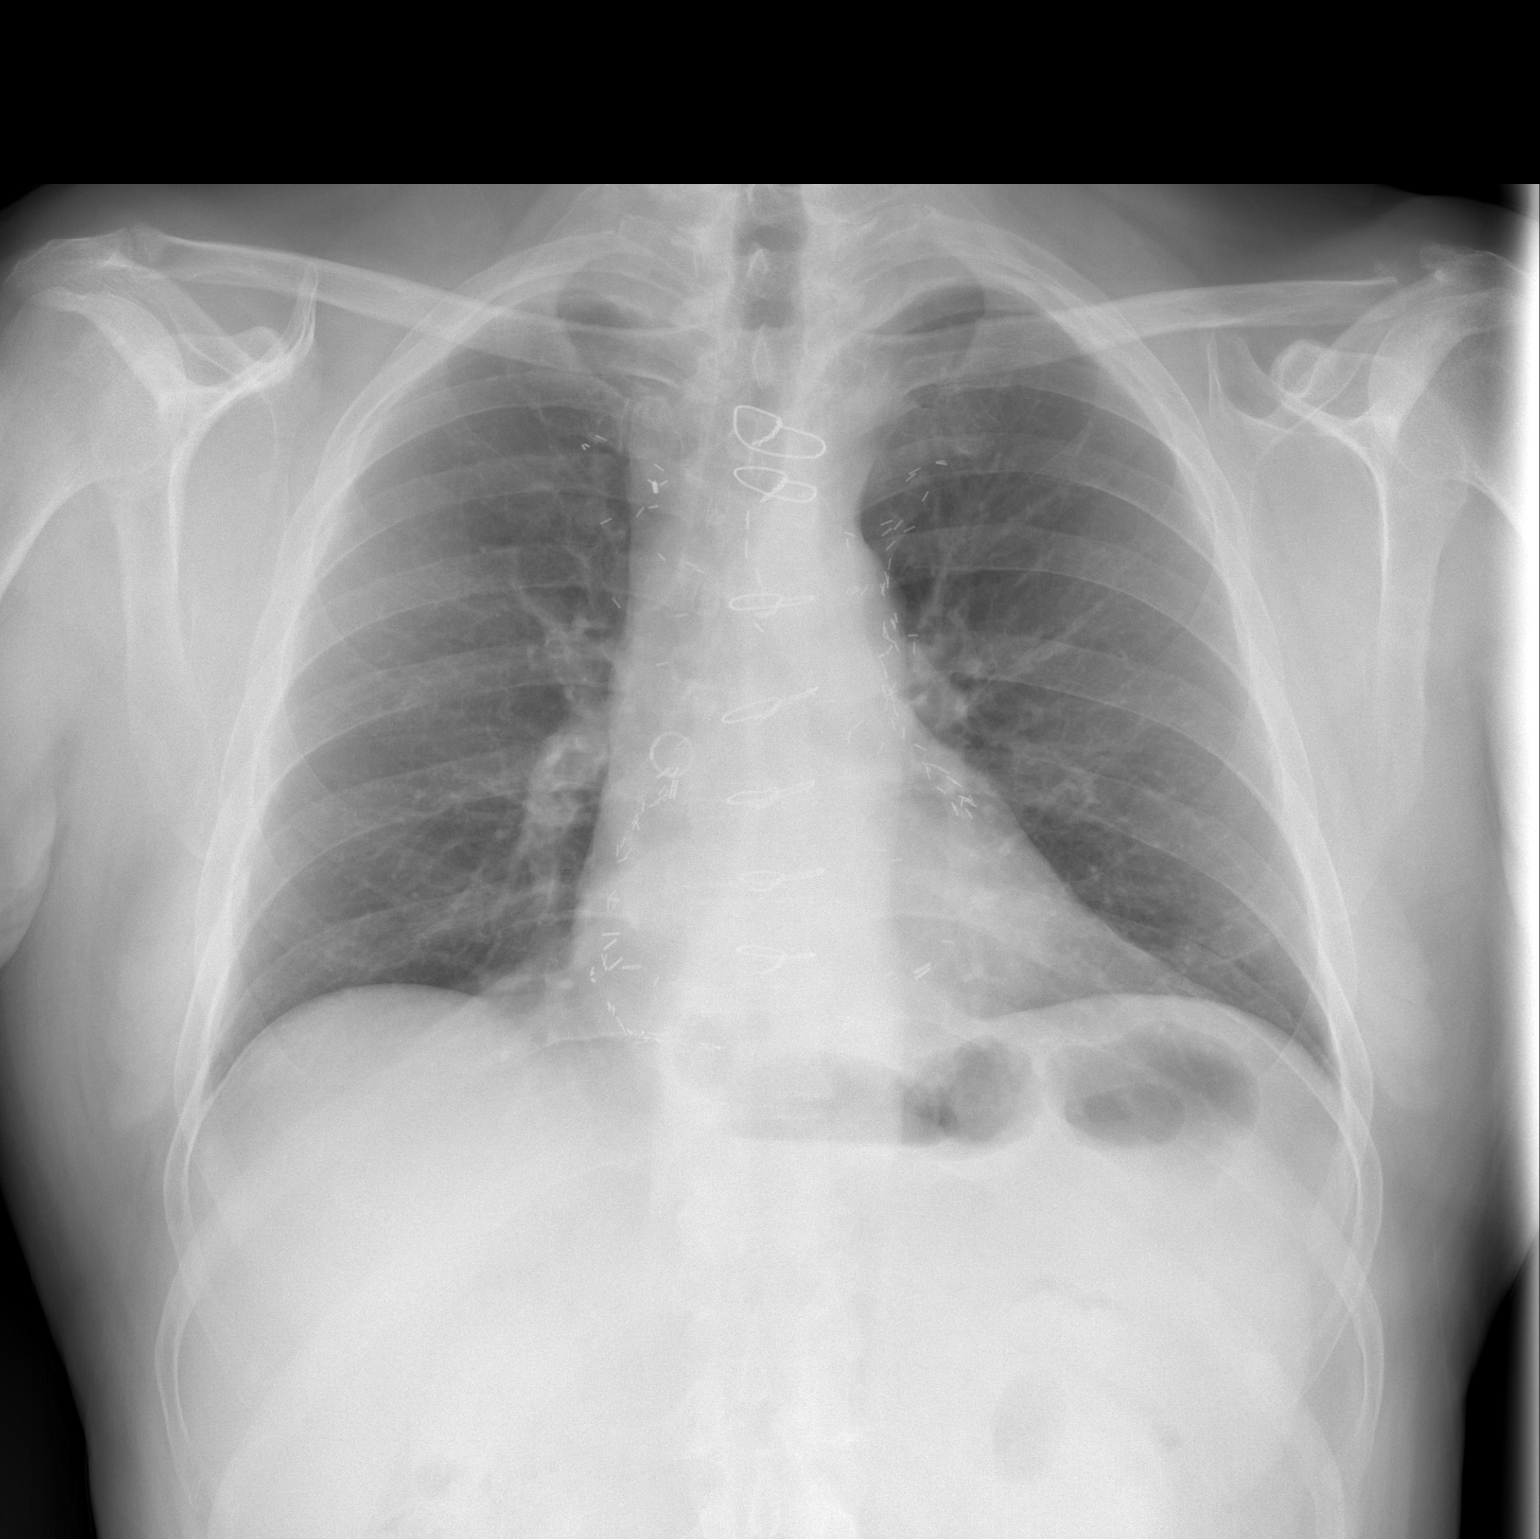

[w chest lat]
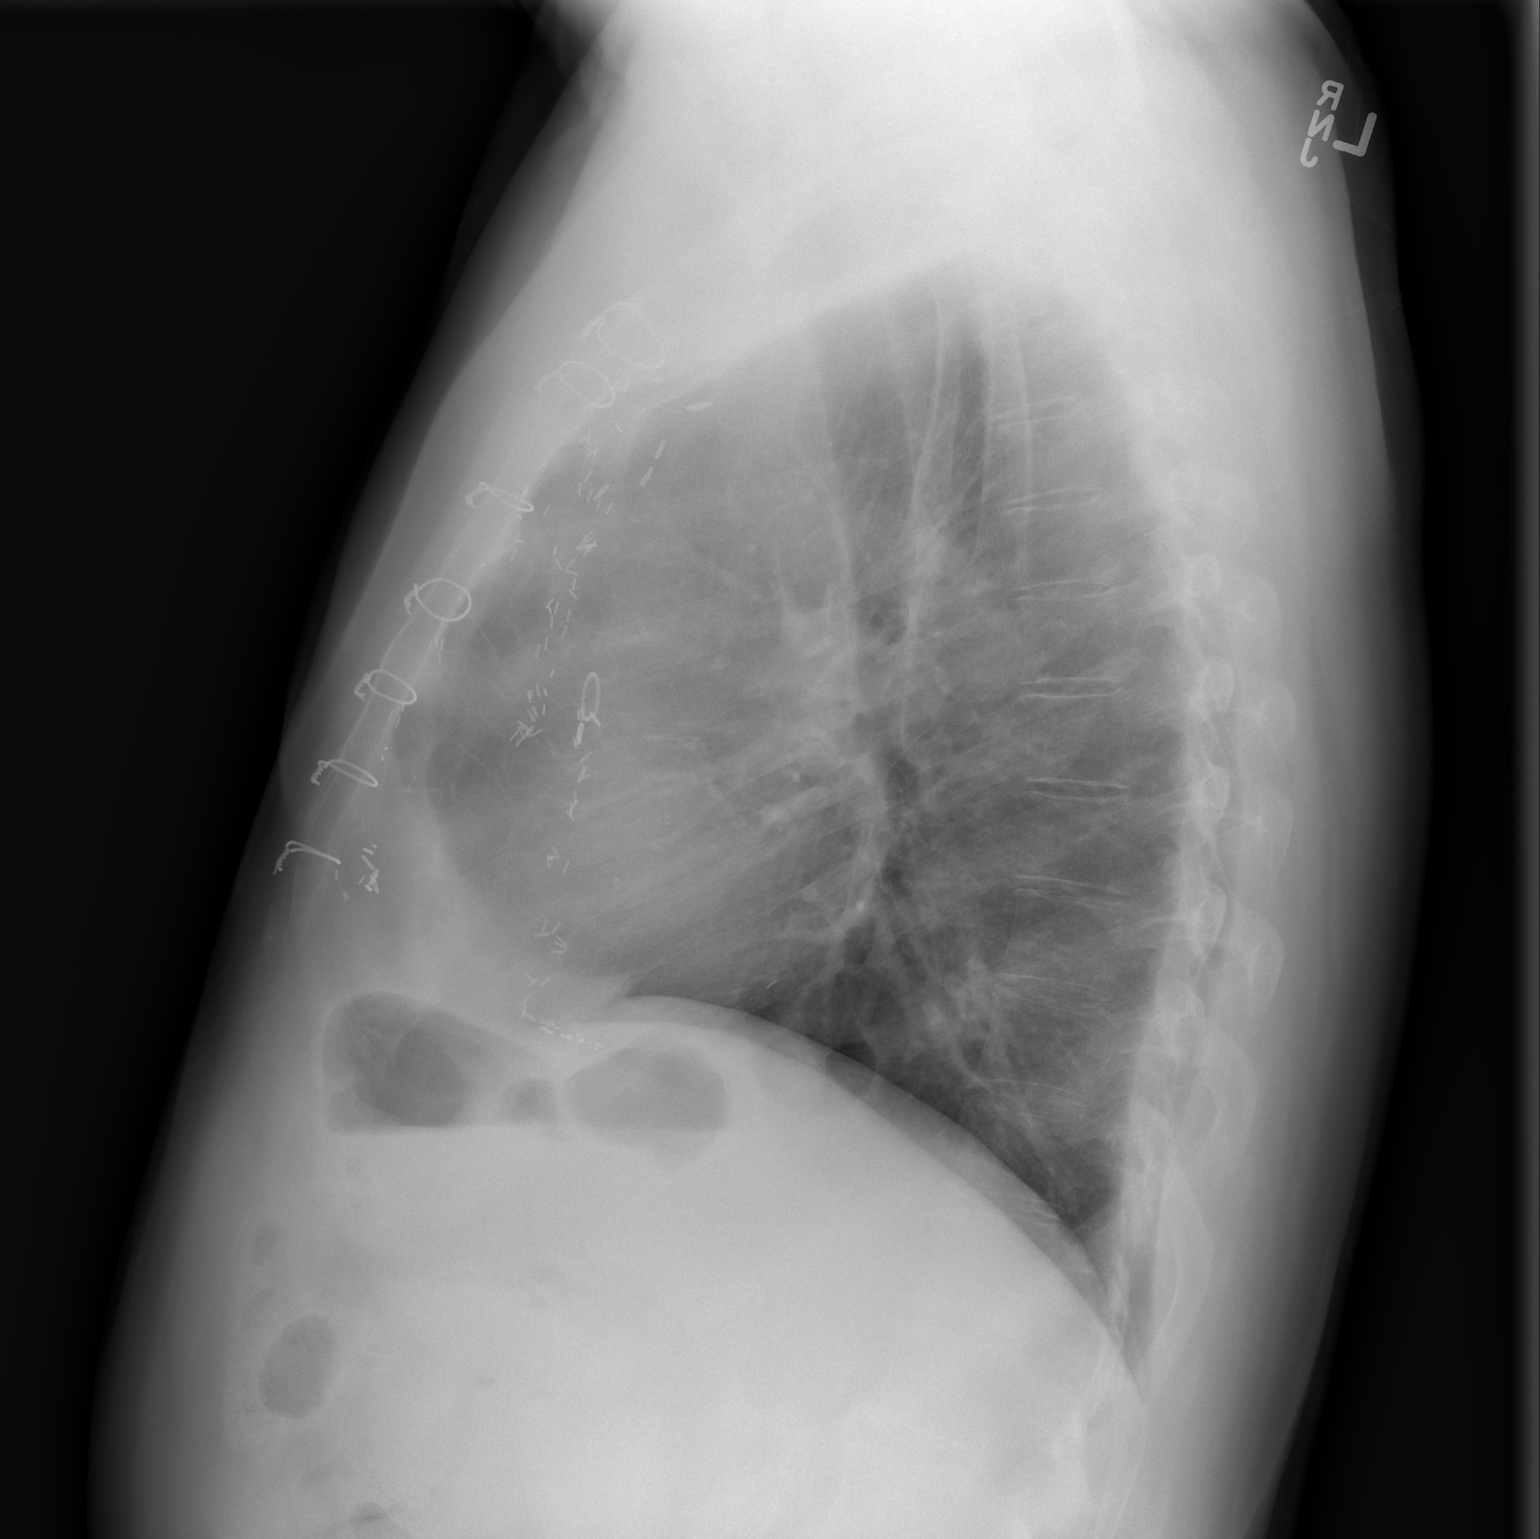

[2 of 2 positions shown; findings below may reference images not displayed]

FINDINGS: Previous coronary bypass changes noted.  Normal heart
size and vascularity.  Negative for pneumonia or CHF.  No effusion
or pneumothorax.  Trachea midline.  No significant interval change.
Degenerative changes of the left AC joint with left distal clavicle
osteolysis.
IMPRESSION: Previous coronary bypass and chronic degenerative changes.

No acute chest process.

## 2013-02-26 ENCOUNTER — Encounter: Payer: Self-pay | Admitting: Internal Medicine

## 2013-02-26 ENCOUNTER — Ambulatory Visit (INDEPENDENT_AMBULATORY_CARE_PROVIDER_SITE_OTHER): Payer: 59 | Admitting: Internal Medicine

## 2013-02-26 VITALS — BP 104/60 | HR 65 | Temp 97.6°F | Ht 68.0 in | Wt 195.4 lb

## 2013-02-26 DIAGNOSIS — R05 Cough: Secondary | ICD-10-CM

## 2013-02-26 MED ORDER — TRAMADOL HCL 50 MG PO TABS: ORAL_TABLET | ORAL | Status: DC

## 2013-02-26 MED ORDER — PREDNISONE (PAK) 10 MG PO TABS: ORAL_TABLET | ORAL | Status: DC

## 2013-02-26 NOTE — Patient Instructions (Addendum)
The key to effective treatment for your cough is eliminating the non-stop cycle of cough you're stuck in long enough to let your airway heal completely and then see if there is anything still making you cough once you stop the cough suppression, but this should take no more than 5 days to figure out  First take delsym two tsp every 12 hours and supplement if needed with  tramadol 50 mg up to 2 every 4 hours to suppress the urge to cough at all or even clear your throat. Swallowing water or using ice chips/non mint and menthol containing candies (such as lifesavers or sugarless jolly ranchers) are also effective.  You should rest your voice and avoid activities that you know make you cough.  Once you have eliminated the cough for 3 straight days try reducing the tramadol first,  then the delsym as tolerated.    Try prilosec 20mg   Take 30-60 min before first meal of the day and Pepcid 20 mg one bedtime until cough is completely gone for at least a week without the need for cough suppression  I think of reflux for chronic cough like I do oxygen for fire (doesn't cause the fire but once you get the oxygen suppressed it usually goes away regardless of the exact cause).  GERD (REFLUX)  is an extremely common cause of respiratory symptoms, many times with no significant heartburn at all.    It can be treated with medication, but also with lifestyle changes including avoidance of late meals, excessive alcohol, smoking cessation, and avoid fatty foods, chocolate, peppermint, colas, red wine, and acidic juices such as orange juice.  NO MINT OR MENTHOL PRODUCTS SO NO COUGH DROPS  USE SUGARLESS CANDY INSTEAD (jolley ranchers or Stover's)  NO OIL BASED VITAMINS - use powdered substitutes.    Prednisone 10 mg take  4 each am x 2 days,   2 each am x 2 days,  1 each am x 2 days and stop    Please schedule a follow up office visit in 4 weeks, sooner if needed

## 2013-02-26 NOTE — Progress Notes (Signed)
Subjective:    Patient ID: Hayden [name], male    DOB: 04/06/1946  MRN: [id number]   Brief patient profile:  32 yowm never smoker with year round sense of nasal drainage since around 1980's year round seems worse in spring in fall some better with otc's then cough onset Jan 2014 referred to pulmonary clinic 01/19/2013 by Dr Wynelle Link for refractory cough.  01/19/2013 pulmonary ov cc abrupt onset Sep 04 2012  during w/d alcohol and drug stay at [facility] and cough is fine at night but after stir in am starts coughing again daily ever since Jan 6 > non productive rx with prednisone and abx and some better but still with speaking on phone cough worse and constantly aware needs to clear his throat    Kouffman Reflux v Neurogenic Cough Differentiator Reflux Comments  Do you awaken from a sound sleep coughing violently?                            With trouble breathing? no   Do you have choking episodes when you cannot  Get enough air, gasping for air ?              no   Do you usually cough when you lie down into  The bed, or when you just lie down to rest ?                          no   Do you usually cough after meals or eating?         no   Do you cough when (or after) you bend over?    no   GERD SCORE     Kouffman Reflux v Neurogenic Cough Differentiator Neurogenic   Do you more-or-less cough all day long? yes   Does change of temperature make you cough? yes   Does laughing or chuckling cause you to cough? yes   Do fumes (perfume, automobile fumes, burned  Toast, etc.,) cause you to cough ?      maybe   Does speaking, singing, or talking on the phone cause you to cough   ?               yes   Neurogenic/Airway score      rec Chlortrimeton 4 mg every 6 hours as needed for throat drainage  Pantoprazole (protonix) 40 mg   Take 30-60 min before first meal of the day and Pepcid 20 mg one bedtime until return to office - this is the best way to tell whether stomach acid is contributing to  your problem.  GERD diet Best cough medication is delsym 2 tsp every 12 hours as needed Prednisone 10 mg take  4 each am x 2 days,   2 each am x 2 days,  1 each am x 2 days and stop   02/26/2013 f/u ov/ re chronic cough Chief Complaint  Patient presents with  . Follow-up    Nonprod cough - unchanged.  improved p prednisone but then back to baseline throat clearing and did not continue h1 prn pnds. Day >> night, no excess mucus   No obvious daytime variabilty or assoc sob  or cp or chest tightness, subjective wheeze overt sinus (not like his usual rhinitis symptoms) or hb symptoms. No unusual exp hx or h/o childhood pna/ asthma or premature birth to his knowledge.   Sleeping  ok without nocturnal  or early am exacerbation  of respiratory  c/o's or need for noct saba. Also denies any obvious fluctuation of symptoms with weather or environmental changes or other aggravating or alleviating factors except as outlined above   Current Medications, Allergies, Past Medical History, Past Surgical History, Family History, and Social History were reviewed in [employer] record.  ROS  The following are not active complaints unless bolded sore throat, dysphagia, dental problems, itching, sneezing,  nasal congestion or excess/ purulent secretions, ear ache,   fever, chills, sweats, unintended wt loss, pleuritic or exertional cp, hemoptysis,  orthopnea pnd or leg swelling, presyncope, palpitations, heartburn, abdominal pain, anorexia, nausea, vomiting, diarrhea  or change in bowel or urinary habits, change in stools or urine, dysuria,hematuria,  rash, arthralgias, visual complaints, headache, numbness weakness or ataxia or problems with walking or coordination,  change in mood/affect or memory.      .         Objective:   Physical Exam  amb wm nad with mod throat clearing  02/26/2013       195  Wt Readings from Last 3 Encounters:  01/19/13 192 lb 3.2 oz (87.181 kg)   09/06/12 182 lb 1.6 oz (82.6 kg)  10/27/11 183 lb (83.008 kg)    HEENT: nl dentition, turbinates, and orophanx. Nl external ear canals without cough reflex   NECK :  without JVD/Nodes/TM/ nl carotid upstrokes bilaterally   LUNGS: no acc muscle use, clear to A and P bilaterally without cough on insp or exp maneuvers   CV:  RRR  no s3 or murmur or increase in P2, no edema   ABD:  soft and nontender with nl excursion in the supine position. No bruits or organomegaly, bowel sounds nl  MS:  warm without deformities, calf tenderness, cyanosis or clubbing  SKIN: warm and dry without lesions    NEURO:  alert, approp, no deficits    12/19/12 Cxr  No acute cardiopulmonary disease.     Assessment & Plan:

## 2013-02-27 NOTE — Assessment & Plan Note (Signed)
Pattern is most c/w  Classic Upper airway cough syndrome, so named because it's frequently impossible to sort out how much is  CR/sinusitis with freq throat clearing (which can be related to primary GERD)   vs  causing  secondary (" extra esophageal")  GERD from wide swings in gastric pressure that occur with throat clearing, often  promoting self use of mint and menthol lozenges that reduce the lower esophageal sphincter tone and exacerbate the problem further in a cyclical fashion.   These are the same pts (now being labeled as having "irritable larynx syndrome" by some cough centers) who not infrequently have a history of having failed to tolerate ace inhibitors,  dry powder inhalers or biphosphonates or report having atypical reflux symptoms that don't respond to standard doses of PPI , and are easily confused as having aecopd or asthma flares by even experienced allergists/ pulmonologists.   Most likely heading toward the use of neurontin based on the cough questionaire pointing to neurogenic cough but pointed out to pt that we first need to eliminate the cycle of coughing/ throat clearing he's been stuck in for years that may have responded somewhat to prednisone>  See instructions for specific recommendations which were reviewed directly with the patient who was given a copy with highlighter outlining the key components.

## 2013-03-27 ENCOUNTER — Ambulatory Visit (INDEPENDENT_AMBULATORY_CARE_PROVIDER_SITE_OTHER): Payer: 59 | Admitting: Internal Medicine

## 2013-03-27 ENCOUNTER — Encounter: Payer: Self-pay | Admitting: Internal Medicine

## 2013-03-27 ENCOUNTER — Other Ambulatory Visit (INDEPENDENT_AMBULATORY_CARE_PROVIDER_SITE_OTHER): Payer: 59

## 2013-03-27 VITALS — BP 124/70 | HR 77 | Temp 98.2°F | Ht 68.0 in | Wt 195.8 lb

## 2013-03-27 DIAGNOSIS — R05 Cough: Secondary | ICD-10-CM

## 2013-03-27 DIAGNOSIS — R059 Cough, unspecified: Secondary | ICD-10-CM

## 2013-03-27 LAB — CBC WITH DIFFERENTIAL/PLATELET
Basophils Absolute: 0 10*3/uL (ref 0.0–0.1)
Hemoglobin: 15.4 g/dL (ref 13.0–17.0)
Lymphocytes Relative: 24.8 % (ref 12.0–46.0)
Monocytes Relative: 11.6 % (ref 3.0–12.0)
Neutro Abs: 4.8 10*3/uL (ref 1.4–7.7)
Neutrophils Relative %: 62.5 % (ref 43.0–77.0)
RDW: 13.7 % (ref 11.5–14.6)

## 2013-03-27 MED ORDER — FAMOTIDINE 20 MG PO TABS: 20.0000 mg | ORAL_TABLET | Freq: Two times a day (BID) | ORAL | Status: DC

## 2013-03-27 MED ORDER — BENZONATATE 200 MG PO CAPS: ORAL_CAPSULE | ORAL | Status: DC

## 2013-03-27 MED ORDER — GABAPENTIN 100 MG PO CAPS: 100.0000 mg | ORAL_CAPSULE | Freq: Three times a day (TID) | ORAL | Status: DC

## 2013-03-27 MED ORDER — PREDNISONE (PAK) 10 MG PO TABS: ORAL_TABLET | ORAL | Status: DC

## 2013-03-27 NOTE — Progress Notes (Signed)
Subjective:    Patient ID: Hayden [name], male    DOB: Mar 23, 1946  MRN: [id number]   Brief patient profile:  30 yowm never smoker with year round sense of nasal drainage since around 1980's year round seems worse in spring in fall some better with otc's then cough onset Jan 2014 referred to pulmonary clinic 01/19/2013 by Dr Wynelle Link for refractory cough.    HPI 01/19/2013 pulmonary ov cc abrupt onset Sep 04 2012  during w/d alcohol and drug stay at [facility] and cough is fine at night but after stir in am starts coughing again daily ever since Jan 6 > non productive rx with prednisone and abx and some better but still with speaking on phone cough worse and constantly aware needs to clear his throat    Kouffman Reflux v Neurogenic Cough Differentiator Reflux Comments  Do you awaken from a sound sleep coughing violently?                            With trouble breathing? no   Do you have choking episodes when you cannot  Get enough air, gasping for air ?              no   Do you usually cough when you lie down into  The bed, or when you just lie down to rest ?                          no   Do you usually cough after meals or eating?         no   Do you cough when (or after) you bend over?    no   GERD SCORE     Kouffman Reflux v Neurogenic Cough Differentiator Neurogenic   Do you more-or-less cough all day long? yes   Does change of temperature make you cough? yes   Does laughing or chuckling cause you to cough? yes   Do fumes (perfume, automobile fumes, burned  Toast, etc.,) cause you to cough ?      maybe   Does speaking, singing, or talking on the phone cause you to cough   ?               yes   Neurogenic/Airway score      rec Chlortrimeton 4 mg every 6 hours as needed for throat drainage  Pantoprazole (protonix) 40 mg   Take 30-60 min before first meal of the day and Pepcid 20 mg one bedtime until return to office - this is the best way to tell whether stomach acid is  contributing to your problem.  GERD diet Best cough medication is delsym 2 tsp every 12 hours as needed Prednisone 10 mg take  4 each am x 2 days,   2 each am x 2 days,  1 each am x 2 days and stop   02/26/2013 f/u ov/ re chronic cough Chief Complaint  Patient presents with  . Follow-up    Nonprod cough - unchanged.  improved p prednisone but then back to baseline throat clearing and did not continue h1 prn pnds. Day >> night, no excess mucus rec Ttake delsym two tsp every 12 hours and supplement if needed with  tramadol 50 mg up to 2 every 4 hours to suppress the urge to cough at all or even clear your throat.  Once you have eliminated the cough  for 3 straight days try reducing the tramadol first,  then the delsym as tolerated.   Try prilosec 20mg   Take 30-60 min before first meal of the day and Pepcid 20 mg one bedtime until cough is completely gone for at least a week without the need for cough suppression  GERD  diet Prednisone 10 mg take  4 each am x 2 days,   2 each am x 2 days,  1 each am x 2 days and stop   Please schedule a follow up office visit in 4 weeks, sooner if needed      03/27/2013 f/u ov/ re chronic throat clearing  Chief Complaint  Patient presents with  . Follow-up    Pt reports little improvement since last OV x 4 weeks ago. Pt states that cough "comes and goes"--not fully relieved yet. Pt still taking medications recommended last OV except for tramadol.     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 No unusual exp hx or h/o childhood pna/ asthma or premature birth to his knowledge.   Sleeping ok without nocturnal  or early am exacerbation  of cough.  Also denies any obvious fluctuation of symptoms with weather or environmental changes or other aggravating or  alleviating factors except as outlined above   Current Medications, Allergies, Past Medical History, Past Surgical History, Family History, and Social History were reviewed in [employer] record.  ROS  The following are not active complaints unless bolded sore throat, dysphagia, dental problems, itching, sneezing,  nasal congestion or excess/ purulent secretions, ear ache,   fever, chills, sweats, unintended wt loss, pleuritic or exertional cp, hemoptysis,  orthopnea pnd or leg swelling, presyncope, palpitations, heartburn, abdominal pain, anorexia, nausea, vomiting, diarrhea  or change in bowel or urinary habits, change in stools or urine, dysuria,hematuria,  rash, arthralgias, visual complaints, headache, numbness weakness or ataxia or problems with walking or coordination,  change in mood/affect or memory.      .         Objective:   Physical Exam  amb wm nad with mod throat clearing  02/26/2013       195  Vs  03/27/2013  195  Wt Readings from Last 3 Encounters:  01/19/13 192 lb 3.2 oz (87.181 kg)  09/06/12 182 lb 1.6 oz (82.6 kg)  10/27/11 183 lb (83.008 kg)    HEENT: nl dentition, turbinates, and orophanx. Nl external ear canals without cough reflex   NECK :  without JVD/Nodes/TM/ nl carotid upstrokes bilaterally   LUNGS: no acc muscle use, clear to A and P bilaterally without cough on insp or exp maneuvers   CV:  RRR  no s3 or murmur or increase in P2, no edema   ABD:  soft and nontender with nl excursion in the supine position. No bruits or organomegaly, bowel sounds nl  MS:  warm without deformities, calf tenderness, cyanosis or clubbing     12/19/12 Cxr  No acute cardiopulmonary disease.     Assessment & Plan:

## 2013-03-27 NOTE — Patient Instructions (Addendum)
neurontin 100mg  three times a day with meals and stop chlortrimeton  For urge to clear throat take tessilon pearls 200mg  4x daily   Try pepcid 20 mg after bfast and supper.  Prednisone 10 mg take  4 each am x 2 days,   2 each am x 2 days,  1 each am x 2 days and stop    Please remember to go to the lab   department downstairs for your tests - we will call you with the results when they are available.    Please schedule a follow up office visit in 4 weeks, sooner if needed

## 2013-03-27 NOTE — Assessment & Plan Note (Signed)
-   Allergy testing 03/27/2013 >>>  Continue to strongly suspect  Classic Upper airway cough syndrome, so named because it's frequently impossible to sort out how much is  CR/sinusitis with freq throat clearing (which can be related to primary GERD)   vs  causing  secondary (" extra esophageal")  GERD from wide swings in gastric pressure that occur with throat clearing, often  promoting self use of mint and menthol lozenges that reduce the lower esophageal sphincter tone and exacerbate the problem further in a cyclical fashion.   These are the same pts (now being labeled as having "irritable larynx syndrome" by some cough centers) who not infrequently have a history of having failed to tolerate ace inhibitors,  dry powder inhalers or biphosphonates or report having atypical reflux symptoms that don't respond to standard doses of PPI , and are easily confused as having aecopd or asthma flares by even experienced allergists/ pulmonologists.   Will add neurontin and w/u for allergic rhinitis but stop 1st gen h1 as ineffecive  Reviewed with pt in detail in view of non adherence with prev recs: The standardized cough guidelines published in Chest by [place] in 2006 are still the best available and consist of a multiple step process (up to 12!) , not a single office visit,  and are intended  to address this problem logically,  with an alogrithm dependent on response to empiric treatment at  each progressive step  to determine a specific diagnosis with  minimal addtional testing needed. Therefore if adherence is an issue or can't be accurately verified,  it's very unlikely the standard evaluation and treatment will be successful here.    Furthermore, response to therapy (other than acute cough suppression, which should only be used short term with avoidance of narcotic containing cough syrups if possible), can be a gradual process for which the patient may perceive immediate benefit.  Unlike going to an eye  doctor where the best perscription is almost always the first one and is immediately effective, this is almost never the case in the management of chronic cough syndromes. Therefore the patient needs to commit up front to consistently adhere to recommendations  for up to 6 weeks of therapy directed at the likely underlying problem(s) before the response can be reasonably evaluated.

## 2013-03-28 ENCOUNTER — Encounter: Payer: Self-pay | Admitting: Internal Medicine

## 2013-03-28 LAB — ALLERGY PROFILE REGION II-DC, DE, MD, ~~LOC~~, VA
Aspergillus fumigatus, m3: <0.1 kU/L
Bermuda Grass: <0.1 kU/L
Cat Dander: <0.1 kU/L
Cladosporium Herbarum: <0.1 kU/L
Common Ragweed: <0.1 kU/L
Elm IgE: <0.1 kU/L
IgE (Immunoglobulin E), Serum: 58.2 IU/mL (ref 0.0–180.0)
Johnson Grass: <0.1 kU/L
Lamb's Quarters: <0.1 kU/L
Meadow Grass: <0.1 kU/L

## 2013-03-29 NOTE — Progress Notes (Signed)
Quick Note:  Spoke with pt and notified of results per Dr. Wert. Pt verbalized understanding and denied any questions.  ______ 

## 2013-04-09 ENCOUNTER — Telehealth: Payer: Self-pay | Admitting: Internal Medicine

## 2013-04-09 NOTE — Telephone Encounter (Signed)
Spoke with pt's wife. Made her aware that Verlon Au spoke with the patient on 03/29/2013 about his results. All results were normal. Pt's wife verbalized understanding.

## 2013-04-24 ENCOUNTER — Ambulatory Visit: Payer: 59 | Admitting: Internal Medicine

## 2013-05-19 ENCOUNTER — Other Ambulatory Visit: Payer: Self-pay | Admitting: Cardiology

## 2013-05-19 DIAGNOSIS — I2510 Atherosclerotic heart disease of native coronary artery without angina pectoris: Secondary | ICD-10-CM

## 2013-05-19 DIAGNOSIS — Z79899 Other long term (current) drug therapy: Secondary | ICD-10-CM

## 2013-05-24 ENCOUNTER — Telehealth: Payer: Self-pay | Admitting: Internal Medicine

## 2013-05-24 MED ORDER — FAMOTIDINE 20 MG PO TABS: 20.0000 mg | ORAL_TABLET | Freq: Two times a day (BID) | ORAL | Status: DC

## 2013-05-24 NOTE — Telephone Encounter (Signed)
Pt aware RX for pepcid has been sent for BID. Nothing further needed

## 2013-06-01 ENCOUNTER — Telehealth: Payer: Self-pay | Admitting: Cardiology

## 2013-06-01 NOTE — Telephone Encounter (Signed)
Patient just wanted to see if it would be beneficial to check his bloodwork (NMR/liver) 1 week prior to his appt with cardiologist.  I stated that would be good to do.  appt made.

## 2013-06-01 NOTE — Telephone Encounter (Signed)
New problem  To Hayden [name].. Pt states he needs to speak with him. (No details)

## 2013-06-28 ENCOUNTER — Other Ambulatory Visit (INDEPENDENT_AMBULATORY_CARE_PROVIDER_SITE_OTHER): Payer: 59

## 2013-06-28 DIAGNOSIS — I2510 Atherosclerotic heart disease of native coronary artery without angina pectoris: Secondary | ICD-10-CM

## 2013-06-28 DIAGNOSIS — Z79899 Other long term (current) drug therapy: Secondary | ICD-10-CM

## 2013-06-28 LAB — ALT: ALT: 24 U/L (ref 0–53)

## 2013-06-29 LAB — NMR LIPOPROFILE WITH LIPIDS
HDL Particle Number: 29.5 umol/L — ABNORMAL LOW (ref >=30.5)
HDL Size: 8.3 nm — ABNORMAL LOW (ref >=9.2)
HDL-C: 37 mg/dL — ABNORMAL LOW (ref >=40)
LDL (calc): 63 mg/dL (ref <100)
LDL Particle Number: 1159 nmol/L — ABNORMAL HIGH (ref <1000)
LDL Size: 20.1 nm — ABNORMAL LOW (ref >20.5)
LP-IR Score: 72 — ABNORMAL HIGH (ref <=45)
Small LDL Particle Number: 742 nmol/L — ABNORMAL HIGH (ref <=527)
VLDL Size: 48.2 nm — ABNORMAL HIGH (ref <=46.6)

## 2013-07-02 ENCOUNTER — Encounter: Payer: Self-pay | Admitting: General Surgery

## 2013-07-02 ENCOUNTER — Other Ambulatory Visit: Payer: Self-pay | Admitting: General Surgery

## 2013-07-02 DIAGNOSIS — Z79899 Other long term (current) drug therapy: Secondary | ICD-10-CM

## 2013-07-02 DIAGNOSIS — E785 Hyperlipidemia, unspecified: Secondary | ICD-10-CM

## 2013-07-02 NOTE — Progress Notes (Signed)
Letter sent to pt

## 2013-07-04 ENCOUNTER — Encounter: Payer: Self-pay | Admitting: Cardiology

## 2013-07-05 ENCOUNTER — Encounter: Payer: Self-pay | Admitting: Cardiology

## 2013-07-05 ENCOUNTER — Ambulatory Visit (INDEPENDENT_AMBULATORY_CARE_PROVIDER_SITE_OTHER): Payer: 59 | Admitting: Cardiology

## 2013-07-05 VITALS — BP 140/80 | HR 78 | Ht 68.0 in | Wt 184.0 lb

## 2013-07-05 DIAGNOSIS — I509 Heart failure, unspecified: Secondary | ICD-10-CM

## 2013-07-05 DIAGNOSIS — E785 Hyperlipidemia, unspecified: Secondary | ICD-10-CM

## 2013-07-05 DIAGNOSIS — I2510 Atherosclerotic heart disease of native coronary artery without angina pectoris: Secondary | ICD-10-CM | POA: Insufficient documentation

## 2013-07-05 DIAGNOSIS — I5032 Chronic diastolic (congestive) heart failure: Secondary | ICD-10-CM

## 2013-07-05 DIAGNOSIS — I10 Essential (primary) hypertension: Secondary | ICD-10-CM

## 2013-07-05 NOTE — Patient Instructions (Signed)
Your physician recommends that you continue on your current medications as directed. Please refer to the Current Medication list given to you today.  I sent your Lipid panel and Hepatic panel results in the mail for you. They should arrive today or tomorrow.   Your physician wants you to follow-up in: 6 Months with Dr. Sherlyn Lick will receive a reminder letter in the mail two months in advance. If you don't receive a letter, please call our office to schedule the follow-up appointment.

## 2013-07-05 NOTE — Progress Notes (Signed)
[address] [place], [place]  [address] Phone: [phone] Fax:  [phone]  Date:  07/05/2013   ID:  Hayden [name], DOB Feb 25, 1946, MRN [id number]  PCP:  Leanor Rubenstein, MD  Cardiologist:  Armanda Magic, MD     History of Present Illness: Hayden [name] is a 67 y.o. male with a history of ASCAD, chronic diastolic CHF, HTN and dyslipidemia who presents today for followup.  He is doing well.  He denies any chest pain, SOB, DOE, LE edema, dizziness, palpitations or syncope.   Wt Readings from Last 3 Encounters:  03/27/13 195 lb 12.8 oz (88.814 kg)  02/26/13 195 lb 6.4 oz (88.633 kg)  01/19/13 192 lb 3.2 oz (87.181 kg)     Past Medical History  Diagnosis Date  . PONV (postoperative nausea and vomiting)     pt prefers epidural is possible  . Impaired hearing     both ears  . Myocardial infarction 10-31-1989  . H/O hiatal hernia   . Arthritis   . Bright's disease age 52  . Kidney stones     89  in past plus stones  . Frequent urination   . Hypogonadism male     No details available but receives replacement therapy  . Anxiety disorder     No details  . Opiate addiction   . Coronary artery disease     MI in 1994 with PTCA of left circ and then coronary bypass 2000  . Hypertension   . Hyperlipidemia     statin intolerant  . Chronic diastolic CHF (congestive heart failure)     Current Outpatient Prescriptions  Medication Sig Dispense Refill  . amLODipine (NORVASC) 5 MG tablet Take 7.5 mg by mouth daily.      Marland Kitchen aspirin EC 81 MG tablet Take 81 mg by mouth daily.      . benzonatate (TESSALON) 200 MG capsule One four times daily as needed for cough  40 capsule  2  . bisoprolol (ZEBETA) 5 MG tablet Take 1/2 tablet daily  15 tablet  11  . Cholecalciferol (VITAMIN D) 2000 UNITS tablet Take 2,000 Units by mouth daily.      Marland Kitchen ezetimibe-simvastatin (VYTORIN) 10-20 MG per tablet Take 1 tablet by mouth daily.       . famotidine (PEPCID) 20 MG tablet Take 1 tablet (20 mg  total) by mouth 2 (two) times daily.  60 tablet  2  . furosemide (LASIX) 20 MG tablet Take 1 tablet (20 mg total) by mouth daily.  30 tablet  11  . gabapentin (NEURONTIN) 100 MG capsule Take 1 capsule (100 mg total) by mouth 3 (three) times daily.  90 capsule  2  . Multiple Vitamin (MULTIVITAMIN WITH MINERALS) TABS Take 1 tablet by mouth daily.      . nitroGLYCERIN (NITROSTAT) 0.4 MG SL tablet Place 0.4 mg under the tongue every 5 (five) minutes as needed. For chest pain.      Marland Kitchen olmesartan (BENICAR) 40 MG tablet Take 40 mg by mouth daily.       Bertram Gala Glycol-Propyl Glycol (SYSTANE ULTRA OP) Apply 1 drop to eye as needed. For eye irritation      . predniSONE (STERAPRED UNI-PAK) 10 MG tablet Prednisone 10 mg take  4 each am x 2 days,   2 each am x 2 days,  1 each am x2days and stop  14 tablet  0   No current facility-administered medications for this visit.  Allergies:    Allergies  Allergen Reactions  . Celecoxib Other (See Comments)    Per Mar  . Penicillins Other (See Comments)    Gi upset   . Sulfa Antibiotics Swelling    Social History:  The patient  reports that he has never smoked. He has never used smokeless tobacco. He reports that he uses illicit drugs. He reports that he does not drink alcohol.   Family History:  The patient's family history includes Cancer - Lung in his father and mother; Heart attack in his father and mother.   ROS:  Please see the history of present illness.      All other systems reviewed and negative.   PHYSICAL EXAM: VS:  There were no vitals taken for this visit. Well nourished, well developed, in no acute distress HEENT: normal Neck: no JVD Cardiac:  normal S1, S2; RRR; no murmur Lungs:  clear to auscultation bilaterally, no wheezing, rhonchi or rales Abd: soft, nontender, no hepatomegaly Ext: no edema Skin: warm and dry Neuro:  CNs 2-12 intact, no focal abnormalities noted    ASSESSMENT AND PLAN:  1. ASCAD with no angina  -  continue ASA 2. HTN  - continue Amlodipin/Bisoprolol/Olmesartan 3. Dyslipidemia - continue Vytorin 4. Chronic diastolic dysfunction - appears euvolemic today  - continue Lasix Followup with me in 6 months Followup with me in 6 months  Signed, Armanda Magic, MD 07/05/2013 9:32 AM

## 2013-08-17 ENCOUNTER — Other Ambulatory Visit: Payer: 59

## 2013-08-21 ENCOUNTER — Other Ambulatory Visit: Payer: Self-pay | Admitting: Cardiology

## 2013-09-10 ENCOUNTER — Other Ambulatory Visit: Payer: Self-pay | Admitting: Cardiology

## 2013-10-09 ENCOUNTER — Telehealth: Payer: Self-pay | Admitting: Cardiology

## 2013-10-09 NOTE — Telephone Encounter (Signed)
Spoke with pt.  He is coughing due to an increase in reflux.  States his pepcid does not work as well.  He has taken Prilosec in the past with success. Explained this would be fine with his other medications to try again.

## 2013-10-09 NOTE — Telephone Encounter (Signed)
New message     Talk to [facility]

## 2013-10-22 ENCOUNTER — Telehealth: Payer: Self-pay | Admitting: Cardiology

## 2013-10-22 NOTE — Telephone Encounter (Signed)
New message     Talk to a nurse about his lasix

## 2013-10-22 NOTE — Telephone Encounter (Signed)
LVM for pt to return call

## 2013-10-30 ENCOUNTER — Other Ambulatory Visit: Payer: Self-pay | Admitting: Internal Medicine

## 2013-10-30 NOTE — Telephone Encounter (Signed)
Have tried contacting pt multiple times and have no received a call back.

## 2013-10-31 ENCOUNTER — Other Ambulatory Visit: Payer: Self-pay

## 2013-10-31 MED ORDER — BISOPROLOL FUMARATE 5 MG PO TABS: ORAL_TABLET | ORAL | Status: DC

## 2013-12-19 ENCOUNTER — Encounter: Payer: Self-pay | Admitting: Cardiology

## 2014-01-01 ENCOUNTER — Other Ambulatory Visit: Payer: 59

## 2014-01-03 IMAGING — CR DG CHEST 2V
2 series · 2 of 2 positions shown · non-contrast
Comparison: Chest x-ray 10/19/2011.

CLINICAL DATA: Cough and shortness of breath.

CHEST - 2 VIEW

[w chest pa]
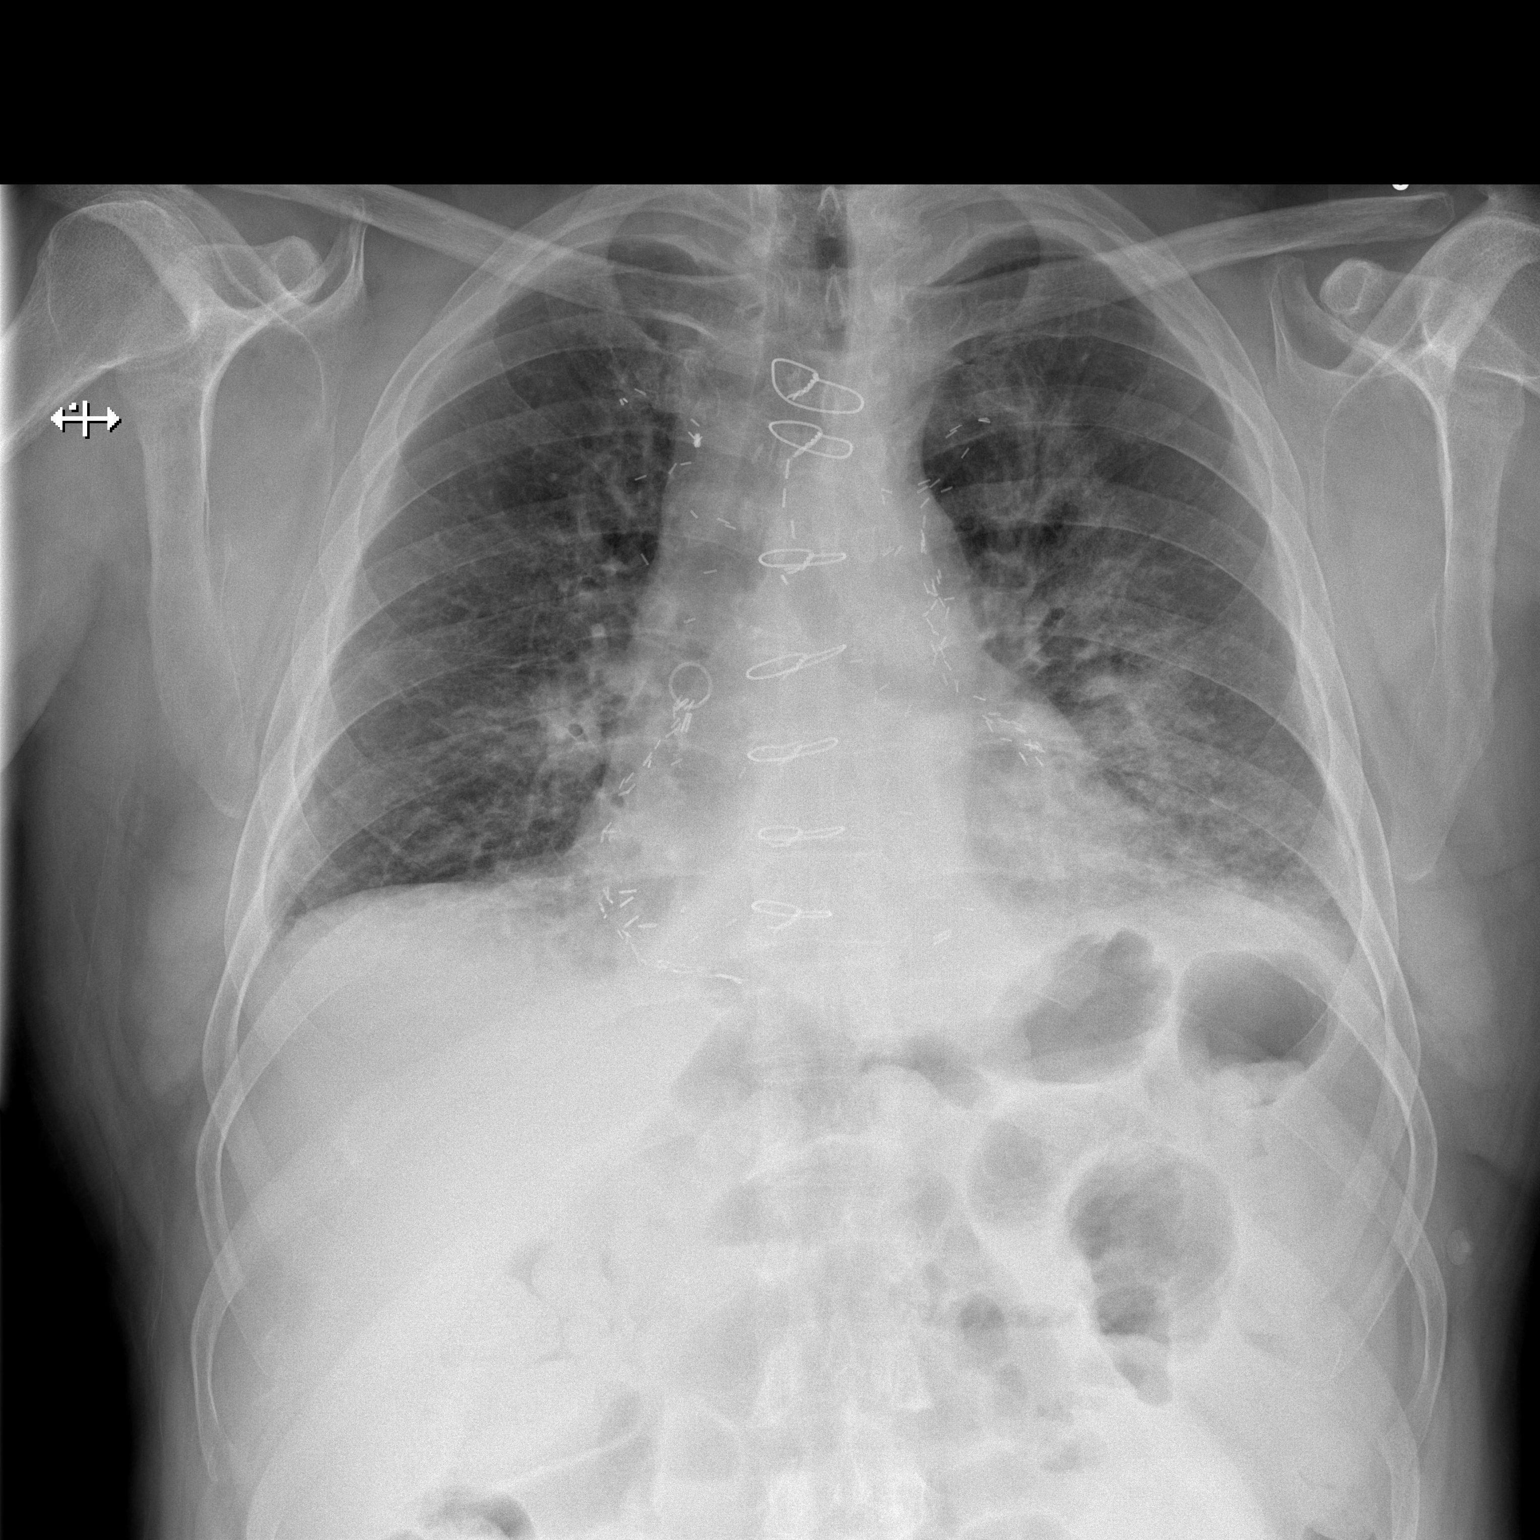

[w chest lat]
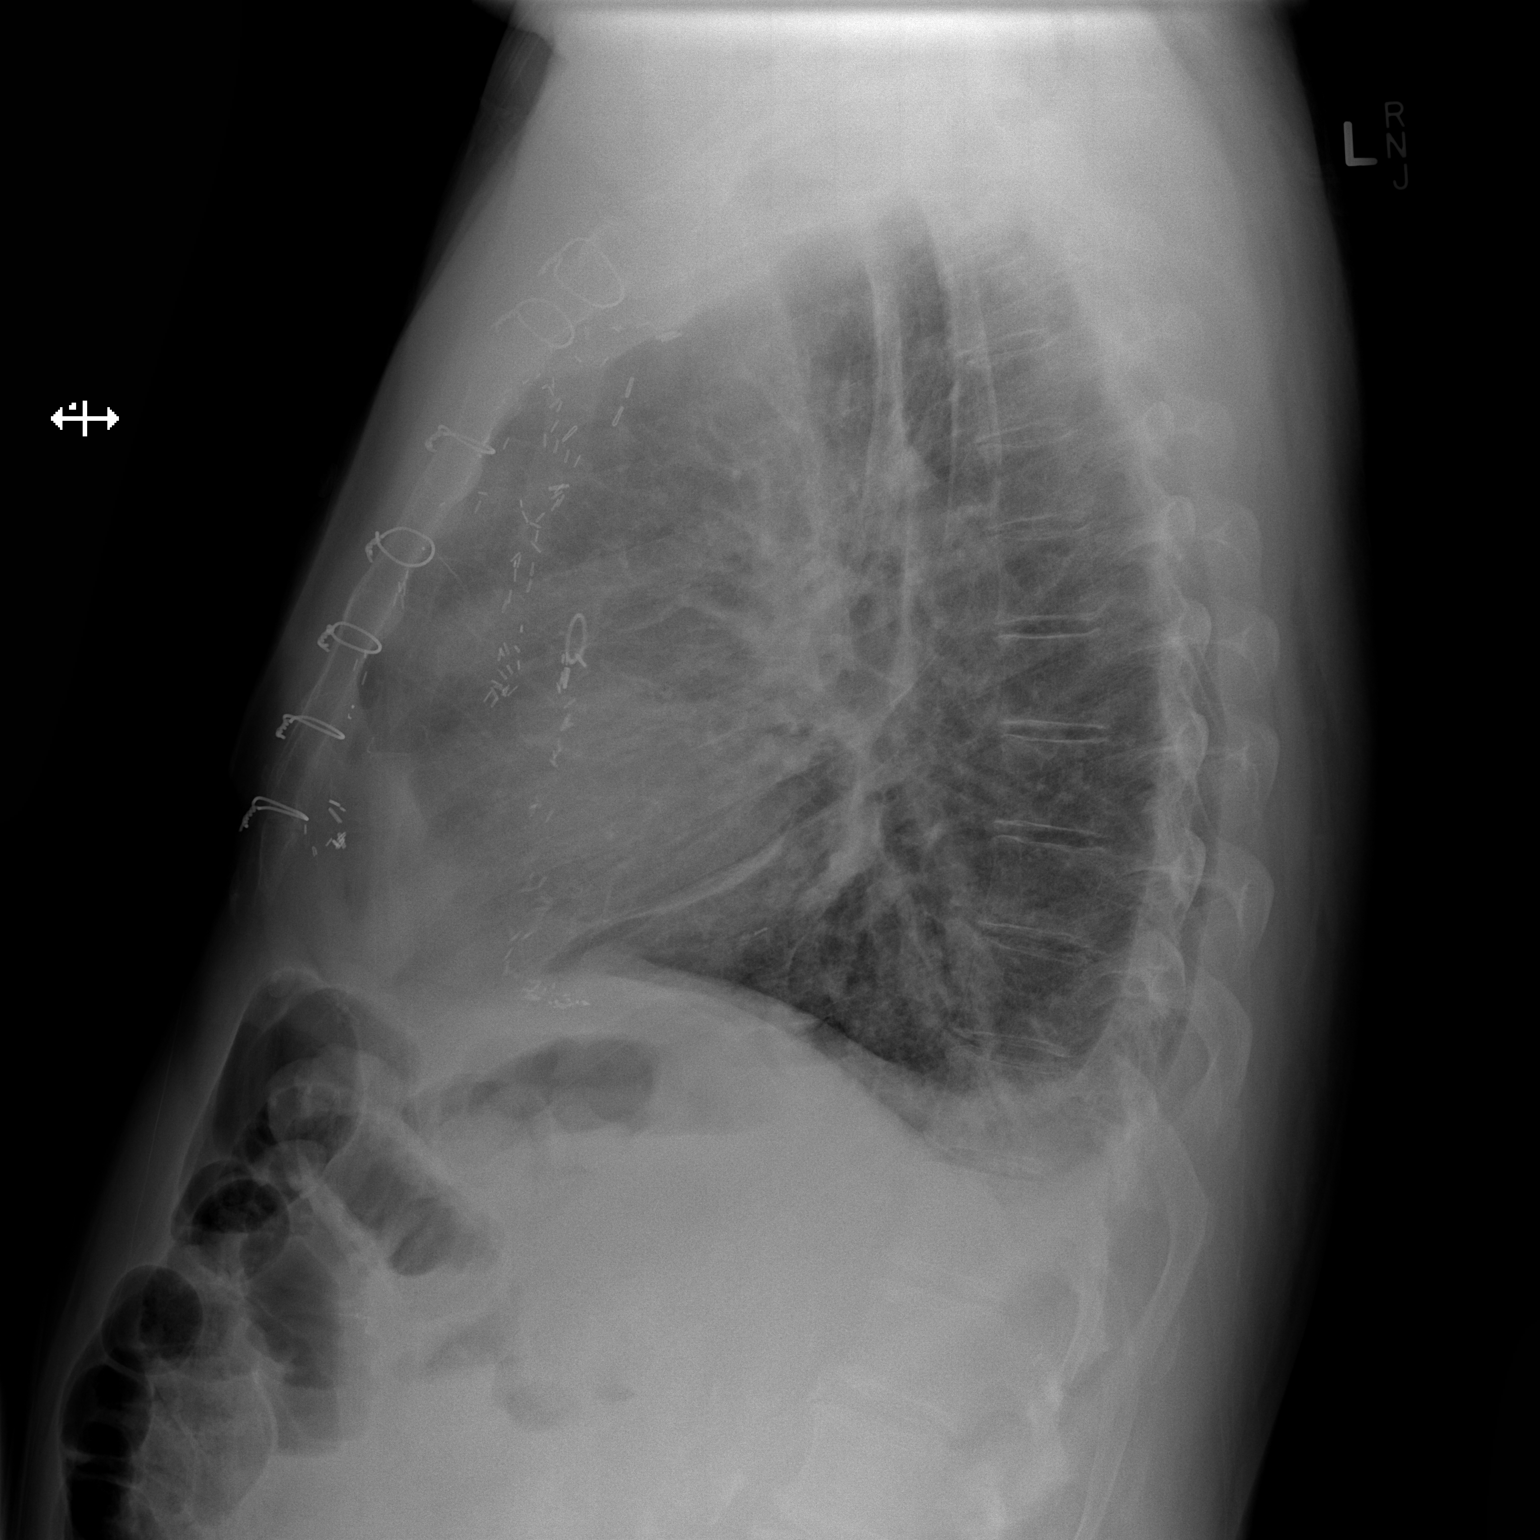

[2 of 2 positions shown; findings below may reference images not displayed]

FINDINGS: Lung volumes are low. There is cephalization of the
pulmonary vasculature and slight indistinctness of the interstitial
markings.  Trace bilateral pleural effusions.  Heart size is upper
limits of normal.  Mediastinal contours are unremarkable.
Atherosclerosis in the thoracic aorta.  Status post median
sternotomy for CABG with [REDACTED].
IMPRESSION: 1. Mild diffuse interstitial prominence, as above, suspicious for
mild pulmonary edema.  There are also trace bilateral pleural
effusions.
2.  Given the patient's symptoms, the possibility of an atypical
infection such as viral pneumonia warrants consideration
(particularly given the absence of overt cardiomegaly).

## 2014-01-05 ENCOUNTER — Other Ambulatory Visit: Payer: Self-pay | Admitting: Cardiology

## 2014-01-14 ENCOUNTER — Other Ambulatory Visit (INDEPENDENT_AMBULATORY_CARE_PROVIDER_SITE_OTHER): Payer: 59

## 2014-01-14 DIAGNOSIS — E785 Hyperlipidemia, unspecified: Secondary | ICD-10-CM

## 2014-01-14 DIAGNOSIS — Z79899 Other long term (current) drug therapy: Secondary | ICD-10-CM

## 2014-01-14 LAB — HEPATIC FUNCTION PANEL
ALBUMIN: 4.2 g/dL (ref 3.5–5.2)
ALT: 26 U/L (ref 0–53)
AST: 24 U/L (ref 0–37)
Alkaline Phosphatase: 73 U/L (ref 39–117)
Bilirubin, Direct: 0 mg/dL (ref 0.0–0.3)
Total Bilirubin: 0.4 mg/dL (ref 0.2–1.2)
Total Protein: 7.3 g/dL (ref 6.0–8.3)

## 2014-01-14 LAB — LIPID PANEL
Cholesterol: 193 mg/dL (ref 0–200)
HDL: 29.8 mg/dL — AB (ref >39.00)
LDL Cholesterol: 142 mg/dL — ABNORMAL HIGH (ref 0–99)
TRIGLYCERIDES: 108 mg/dL (ref 0.0–149.0)
Total CHOL/HDL Ratio: 6
VLDL: 21.6 mg/dL (ref 0.0–40.0)

## 2014-01-16 ENCOUNTER — Telehealth: Payer: Self-pay | Admitting: Cardiology

## 2014-01-16 ENCOUNTER — Other Ambulatory Visit: Payer: Self-pay | Admitting: General Surgery

## 2014-01-16 DIAGNOSIS — E785 Hyperlipidemia, unspecified: Secondary | ICD-10-CM

## 2014-01-16 MED ORDER — EZETIMIBE-SIMVASTATIN 10-20 MG PO TABS: 0.5000 | ORAL_TABLET | Freq: Every day | ORAL | Status: DC

## 2014-01-16 NOTE — Telephone Encounter (Signed)
New message     Want blood work results

## 2014-01-16 NOTE — Telephone Encounter (Signed)
Called pt to give Blood results.

## 2014-01-17 ENCOUNTER — Ambulatory Visit (INDEPENDENT_AMBULATORY_CARE_PROVIDER_SITE_OTHER): Payer: 59 | Admitting: Cardiology

## 2014-01-17 ENCOUNTER — Encounter: Payer: Self-pay | Admitting: Cardiology

## 2014-01-17 ENCOUNTER — Telehealth: Payer: Self-pay | Admitting: Pharmacist

## 2014-01-17 VITALS — BP 122/70 | HR 72 | Ht 68.0 in | Wt 185.0 lb

## 2014-01-17 DIAGNOSIS — I10 Essential (primary) hypertension: Secondary | ICD-10-CM

## 2014-01-17 DIAGNOSIS — E785 Hyperlipidemia, unspecified: Secondary | ICD-10-CM

## 2014-01-17 DIAGNOSIS — I509 Heart failure, unspecified: Secondary | ICD-10-CM

## 2014-01-17 DIAGNOSIS — I5032 Chronic diastolic (congestive) heart failure: Secondary | ICD-10-CM

## 2014-01-17 DIAGNOSIS — I2510 Atherosclerotic heart disease of native coronary artery without angina pectoris: Secondary | ICD-10-CM

## 2014-01-17 NOTE — Progress Notes (Signed)
[address] [place], [place]  [address] Phone: [phone] Fax:  [phone]  Date:  01/17/2014   ID:  Hayden [name], DOB 1946/05/10, MRN [id number]  PCP:  Lynne Logan, MD  Cardiologist:  Fransico Him, MD     History of Present Illness: Hayden [name] is a 68 y.o. male with a history of ASCAD, chronic diastolic CHF, HTN and dyslipidemia who presents today for followup. He is doing well. He denies any chest pain, SOB, DOE, LE edema, dizziness, palpitations or syncope. He was having muscle aches on Vytorin and stopped it about 3 months ago and his muscle aches resolved.  He exercises on a recumbent bike, treadmill and total gym that he works out on.  He is also working out in the yard without any problems and says he feel great.   Wt Readings from Last 3 Encounters:  01/17/14 185 lb (83.915 kg)  07/05/13 184 lb (83.462 kg)  03/27/13 195 lb 12.8 oz (88.814 kg)     Past Medical History  Diagnosis Date  . PONV (postoperative nausea and vomiting)     pt prefers epidural is possible  . Impaired hearing     both ears  . Myocardial infarction 10-31-1989  . H/O hiatal hernia   . Arthritis   . Bright's disease age 47  . Kidney stones     89  in past plus stones  . Frequent urination   . Hypogonadism male     No details available but receives replacement therapy  . Anxiety disorder     No details  . Opiate addiction   . Coronary artery disease     MI in 1994 with PTCA of left circ and then coronary bypass 2000  . Hypertension   . Hyperlipidemia     statin intolerant  . Chronic diastolic CHF (congestive heart failure)     Current Outpatient Prescriptions  Medication Sig Dispense Refill  . amLODipine (NORVASC) 5 MG tablet TAKE 1 AND 1/2 TABLETS BY MOUTH ONCE DAILY  45 tablet  5  . aspirin EC 81 MG tablet Take 81 mg by mouth daily.      Marland Kitchen BENICAR 40 MG tablet take 1 tablet by mouth once daily  30 tablet  6  . bisoprolol (ZEBETA) 5 MG tablet Take 1/2 tablet daily  15  tablet  11  . Cholecalciferol (VITAMIN D) 2000 UNITS tablet Take 2,000 Units by mouth daily.      . famotidine (PEPCID) 20 MG tablet Take 1 tablet (20 mg total) by mouth 2 (two) times daily.  60 tablet  2  . Multiple Vitamin (MULTIVITAMIN WITH MINERALS) TABS Take 1 tablet by mouth daily.      . nitroGLYCERIN (NITROSTAT) 0.4 MG SL tablet Place 0.4 mg under the tongue every 5 (five) minutes as needed. For chest pain.       No current facility-administered medications for this visit.    Allergies:    Allergies  Allergen Reactions  . Celecoxib Other (See Comments)    Per Mar  . Penicillins Other (See Comments)    Gi upset   . Sulfa Antibiotics Swelling    Social History:  The patient  reports that he has never smoked. He has never used smokeless tobacco. He reports that he uses illicit drugs. He reports that he does not drink alcohol.   Family History:  The patient's family history includes Cancer - Lung in his father and mother; Heart attack in his  father and mother.   ROS:  Please see the history of present illness.      All other systems reviewed and negative.   PHYSICAL EXAM: VS:  BP 122/70  Pulse 72  Ht 5\' 8"  (1.727 m)  Wt 185 lb (83.915 kg)  BMI 28.14 kg/m2 Well nourished, well developed, in no acute distress HEENT: normal Neck: no JVD Cardiac:  normal S1, S2; RRR; no murmur Lungs:  clear to auscultation bilaterally, no wheezing, rhonchi or rales Abd: soft, nontender, no hepatomegaly Ext: no edema Skin: warm and dry Neuro:  CNs 2-12 intact, no focal abnormalities noted  EKG:  NSR with diffuse T wave abnormality in inferior and anterolateral leads slightly more pronounced compared to EKG 01-Oct-2012  ASSESSMENT AND PLAN:  1. ASCAD with no angina - continue ASA  2. HTN - continue Amlodipin/Bisoprolol/Olmesartan  3. Dyslipidemia - He is intolerant to statins.  I am going to have him see Alferd Apa PharmD to discuss possibility of a study drug 4. Chronic diastolic  dysfunction - appears euvolemic today and took himself off lasix 6 months ago because he felt he was urinating too much  Followup with me in 6 months    Signed, Fransico Him, MD 01/17/2014 2:33 PM

## 2014-01-17 NOTE — Patient Instructions (Signed)
Your physician recommends that you continue on your current medications as directed. Please refer to the Current Medication list given to you today.  Your physician wants you to follow-up in: 6 months with Dr Turner You will receive a reminder letter in the mail two months in advance. If you don't receive a letter, please call our office to schedule the follow-up appointment.  

## 2014-01-17 NOTE — Telephone Encounter (Signed)
Patient and I discussed his cholesterol medication in the office after he saw Dr. Radford Pax today.  He wanted to discuss Vytorin vs one of the PCSK-9 inhibitor agents.  He has been having trouble with Vytorin 10/20 mg with muscle aches similar to what he had with previously used statins.  He recently went off Vytorin and LDL went from 65 mg/dL up to 140 mg/dL.  Patient and I had a long discussion about benefits of LDL reduction and heart disease.  He had a likely non-ST elevation MI in 08/2012 and wants to make sure he doesn't have more.  Given the PCSK-9 inhibitors will be on the market in 3 months or so, he would like to wait to get onto one of them then, as opposed to try and enroll into clinical trial as it is possible to get randomized to placebo.  He is 68 y.o., but has private insurance through his job.  Has [employer] and hopes to get one of the 2 PCSK-9 inhibitors covered.    RF: CAD, HTN, age - LDL goal < 70, LDL-P goal < 1000. Meds: not on lipid lowering meds at this time. Off his Vytorin 10/20 mg qd for a few months. Intolerant to Crestor, pravachol, Zocor, and lipitor. Previously TC was 92, and LDL was < 50 mg/dL on Vytorin.  Plan: 1.  He is agreeable to restart Vytorin 10/20 mg at 1/4 tablet daily for now if possible. 2.  I will call him in 2-3 months when these agents are available and get him on one.  I've added his name to a list of patients to call when these drugs are on the market.  To Dr. Radford Pax as Juluis Rainier only

## 2014-01-18 ENCOUNTER — Encounter: Payer: Self-pay | Admitting: Cardiology

## 2014-02-20 ENCOUNTER — Encounter: Payer: Self-pay | Admitting: Interventional Cardiology

## 2014-02-21 ENCOUNTER — Other Ambulatory Visit: Payer: Self-pay | Admitting: *Deleted

## 2014-02-21 MED ORDER — AMLODIPINE BESYLATE 5 MG PO TABS: ORAL_TABLET | ORAL | Status: DC

## 2014-03-27 ENCOUNTER — Encounter: Payer: Self-pay | Admitting: General Surgery

## 2014-03-27 ENCOUNTER — Telehealth: Payer: Self-pay | Admitting: Pharmacist

## 2014-03-27 ENCOUNTER — Other Ambulatory Visit (INDEPENDENT_AMBULATORY_CARE_PROVIDER_SITE_OTHER): Payer: 59

## 2014-03-27 DIAGNOSIS — E785 Hyperlipidemia, unspecified: Secondary | ICD-10-CM

## 2014-03-27 DIAGNOSIS — Z79899 Other long term (current) drug therapy: Secondary | ICD-10-CM

## 2014-03-27 LAB — HEPATIC FUNCTION PANEL
ALT: 23 U/L (ref 0–53)
AST: 22 U/L (ref 0–37)
Albumin: 4.1 g/dL (ref 3.5–5.2)
Alkaline Phosphatase: 76 U/L (ref 39–117)
BILIRUBIN TOTAL: 0.4 mg/dL (ref 0.2–1.2)
Bilirubin, Direct: 0 mg/dL (ref 0.0–0.3)
Total Protein: 7.3 g/dL (ref 6.0–8.3)

## 2014-03-27 LAB — LIPID PANEL
CHOLESTEROL: 123 mg/dL (ref 0–200)
HDL: 30.3 mg/dL — ABNORMAL LOW (ref >39.00)
LDL CALC: 68 mg/dL (ref 0–99)
NONHDL: 92.7
Total CHOL/HDL Ratio: 4
Triglycerides: 125 mg/dL (ref 0.0–149.0)
VLDL: 25 mg/dL (ref 0.0–40.0)

## 2014-03-27 NOTE — Telephone Encounter (Signed)
Patient has h/o CAD and difficult time tolerating statins.  He has failed Zocor, Lipiotor, Crestor, and pravastatin due to muscle aches.  Also failed Vytorin 10/20 mg qd due to muscle aches, but has been able to tolerate 1/2 of vytorin 10/20 mg daily.  His LDL is ~ 150 mg/dL off therapy, with goal of < 70 mg/dL.  He would like to check lipid / liver today and if LDL not at goal, have Praluent added.  He has [employer].  Lab order put in.

## 2014-03-27 NOTE — Telephone Encounter (Signed)
Patient came in and received Praluent counseling, paperwork filled out, and lab work to repeat lipid / liver set up for 05/17/14.

## 2014-03-28 ENCOUNTER — Other Ambulatory Visit: Payer: Self-pay | Admitting: Pharmacist

## 2014-03-28 MED ORDER — SIMVASTATIN 5 MG PO TABS: ORAL_TABLET | ORAL | Status: DC

## 2014-03-28 NOTE — Telephone Encounter (Signed)
He is going to be on simvastatin 5 mg monotherapy for now.  If unable to get LDL low enough on the highest dose he can tolerate, will add Praluent at that time.

## 2014-03-28 NOTE — Telephone Encounter (Signed)
Is he going to be on Simvastatin or Praulent or both?

## 2014-03-28 NOTE — Telephone Encounter (Signed)
To Ysidro Evert to advise

## 2014-03-28 NOTE — Telephone Encounter (Signed)
Patient tells me that his Vytorin 10/20 mg (1/2 tablet daily) is causing similar muscle aches that the other statins caused.  His LDL did drop to 68 mg/dL on Vytorin 5/10 mg qd, however he tells me he can't do a lot of his daily activities due to fatigue and weakness and would like to come off vytorin.  Patient is willing to try simvastatin 5 mg only, and start with only twice weekly, and increase by 1 pill each week until he finds he can't tolerate any higher of a dose.   Plan: 1.  Stop Vytorin today. 2. Next week start simvastatin 5 mg biw.  The following week increase to 5 mg tiw, then qiw the following week, and so forth.   3.  He is to call me once he gets up to daily simvastatin 5 mg, or if he finds his maximum tolerated dose. 4.  Will check a lipid / liver soon after that, and if LDL is elevated on this regimen, and he is tolerating it, will add Praluent at that time. To Dr. Radford Pax as Juluis Rainier.

## 2014-05-03 ENCOUNTER — Encounter: Payer: Self-pay | Admitting: Cardiology

## 2014-05-16 NOTE — Telephone Encounter (Signed)
Erroneous

## 2014-05-17 ENCOUNTER — Other Ambulatory Visit: Payer: 59

## 2014-05-21 ENCOUNTER — Other Ambulatory Visit (INDEPENDENT_AMBULATORY_CARE_PROVIDER_SITE_OTHER): Payer: 59

## 2014-05-21 DIAGNOSIS — Z79899 Other long term (current) drug therapy: Secondary | ICD-10-CM

## 2014-05-21 DIAGNOSIS — E785 Hyperlipidemia, unspecified: Secondary | ICD-10-CM

## 2014-05-21 LAB — LIPID PANEL
CHOLESTEROL: 203 mg/dL — AB (ref 0–200)
HDL: 28.3 mg/dL — ABNORMAL LOW (ref >39.00)
LDL CALC: 145 mg/dL — AB (ref 0–99)
NonHDL: 174.7
Total CHOL/HDL Ratio: 7
Triglycerides: 149 mg/dL (ref 0.0–149.0)
VLDL: 29.8 mg/dL (ref 0.0–40.0)

## 2014-05-21 LAB — HEPATIC FUNCTION PANEL
ALK PHOS: 81 U/L (ref 39–117)
ALT: 24 U/L (ref 0–53)
AST: 21 U/L (ref 0–37)
Albumin: 4.1 g/dL (ref 3.5–5.2)
BILIRUBIN DIRECT: 0.1 mg/dL (ref 0.0–0.3)
BILIRUBIN TOTAL: 0.6 mg/dL (ref 0.2–1.2)
Total Protein: 7.6 g/dL (ref 6.0–8.3)

## 2014-05-22 ENCOUNTER — Telehealth: Payer: Self-pay | Admitting: Pharmacist

## 2014-05-22 DIAGNOSIS — E785 Hyperlipidemia, unspecified: Secondary | ICD-10-CM

## 2014-05-22 DIAGNOSIS — Z79899 Other long term (current) drug therapy: Secondary | ICD-10-CM

## 2014-05-22 MED ORDER — ALIROCUMAB 75 MG/ML ~~LOC~~ SOPN: 75.0000 mg | PEN_INJECTOR | SUBCUTANEOUS | Status: DC

## 2014-05-22 NOTE — Telephone Encounter (Signed)
RF: CAD, HTN, age - LDL goal < 70, LDL-P goal < 1000. Meds: Simvastatin 5 mg biw Intolerant to Crestor, pravachol, Zocor (doses > 5 mg tiw), Vytorin 10/20 mg qd, and lipitor. Patient is eligible for Praluent 75 mg given CAD, on low dose simvastatin 5 mg, and can't tolerate higher doses of statin. Plan: 1.  Will start Praluent 75 mg q 2 weeks today. 2.  Will submit paperwork and give samples of Praluent. 3.  Continue simvastatin 5 mg biw 4.  Recheck cholesterol in 6 weeks, and see Gay Filler 2 days later to review (07/02/14 lab and appointment with Gay Filler 07/04/14)  Samples of Praluent 75 mg #3 given.  Instruction regarding proper dosing, storage, and use given to patient.  He is to call if he has any problems or questions.  To Dr. Radford Pax as FYI only.

## 2014-07-02 ENCOUNTER — Other Ambulatory Visit (INDEPENDENT_AMBULATORY_CARE_PROVIDER_SITE_OTHER): Payer: 59

## 2014-07-02 DIAGNOSIS — E785 Hyperlipidemia, unspecified: Secondary | ICD-10-CM

## 2014-07-02 DIAGNOSIS — Z79899 Other long term (current) drug therapy: Secondary | ICD-10-CM

## 2014-07-02 LAB — LIPID PANEL
CHOL/HDL RATIO: 4
Cholesterol: 123 mg/dL (ref 0–200)
HDL: 34.4 mg/dL — ABNORMAL LOW (ref >39.00)
LDL CALC: 70 mg/dL (ref 0–99)
NonHDL: 88.6
TRIGLYCERIDES: 92 mg/dL (ref 0.0–149.0)
VLDL: 18.4 mg/dL (ref 0.0–40.0)

## 2014-07-02 LAB — HEPATIC FUNCTION PANEL
ALT: 22 U/L (ref 0–53)
AST: 16 U/L (ref 0–37)
Albumin: 3.5 g/dL (ref 3.5–5.2)
Alkaline Phosphatase: 67 U/L (ref 39–117)
BILIRUBIN DIRECT: 0 mg/dL (ref 0.0–0.3)
TOTAL PROTEIN: 6.9 g/dL (ref 6.0–8.3)
Total Bilirubin: 0.3 mg/dL (ref 0.2–1.2)

## 2014-07-04 ENCOUNTER — Ambulatory Visit (INDEPENDENT_AMBULATORY_CARE_PROVIDER_SITE_OTHER): Payer: 59 | Admitting: Pharmacist

## 2014-07-04 DIAGNOSIS — E785 Hyperlipidemia, unspecified: Secondary | ICD-10-CM

## 2014-07-04 NOTE — Assessment & Plan Note (Signed)
Pt's LDL at goal after starting Praluent.  He reports no issues with the new therapy.  Unfortunately he was unable to tolerate simvastatin even at just 2 days of the week so will not restart.  His LDL may continue to decrease given this was taken after just 3 doses of Praluent.  Will continue with 75mg  for now and recheck labs in 6 months.  If LDL is greater than 70, can consider increasing dose to 150mg  every 2 weeks.

## 2014-07-04 NOTE — Patient Instructions (Signed)
Continue Praluent 75mg  injection every 2 weeks.  Recheck labs in 6 months.

## 2014-07-04 NOTE — Progress Notes (Signed)
HPI  [name] is a 68 yo M pt of Dr. Radford Pax who was sent to the [facility] for statin intolerance.  He has failed Zocor, Crestor, pravastatin, and Vytorin due to muscle aches.  His LDL off therapy is ~150mg /dL.  Vytorin 5/10 did decrease his LDL to 68mg /dL but limited his daily activities so he was given an rx for simvastatin 5mg  in July  and asked to start with 2 days of the week and titrate to highest tolerated dose.  He was able to tolerate only 2 days of the week and in September his repeat labs showed an LDL of 145mg /dL.  Discussed addition of Praluent at that time.  Pt was agreeable.  He was started on 75mg  every 2 weeks.  Pt returns today for follow up.  He has taken 3 doses of Praluent and had no problems.  He did stop the simvastatin due to muscle pains.  He has had no mylagias since stopping simvastatin.  He reports no real changes in lifestyle over the past 2 months.    RF: CAD, HTN, age - LDL goal < 70, LDL-P goal < 1000. Meds: Praluent 75mg  every 2 weeks.  Intolerant to Crestor, pravachol, Zocor (doses > 5 mg tiw), Vytorin 10/20 mg qd, Lipitor, simvastatin 5mg  twice weekly .  Labs:  05/2014: TC 123, TG 92, HDL 34, LDL 70, LFTS normal (Praluent 75mg  every 2 weeks) 04/2014: TC 203, TG 149, HDL 28, LDL 145, LFTs normal (simvastatin 5mg  twice weekly) 02/2014: TC 123, TG 125, HDL 30, LDL 68, LFTs normal (Vytorin 5/10)  Current Outpatient Prescriptions  Medication Sig Dispense Refill  . Alirocumab (PRALUENT) 75 MG/ML SOPN Inject 75 mg into the skin every 14 (fourteen) days.    Marland Kitchen amLODipine (NORVASC) 5 MG tablet TAKE 1 AND 1/2 TABLETS BY MOUTH ONCE DAILY 45 tablet 4  . aspirin EC 81 MG tablet Take 81 mg by mouth daily.    Marland Kitchen BENICAR 40 MG tablet take 1 tablet by mouth once daily 30 tablet 6  . bisoprolol (ZEBETA) 5 MG tablet Take 1/2 tablet daily 15 tablet 11  . Cholecalciferol (VITAMIN D) 2000 UNITS tablet Take 2,000 Units by mouth daily.    . famotidine (PEPCID) 20 MG tablet Take 1  tablet (20 mg total) by mouth 2 (two) times daily. 60 tablet 2  . Multiple Vitamin (MULTIVITAMIN WITH MINERALS) TABS Take 1 tablet by mouth daily.    . nitroGLYCERIN (NITROSTAT) 0.4 MG SL tablet Place 0.4 mg under the tongue every 5 (five) minutes as needed. For chest pain.    . simvastatin (ZOCOR) 5 MG tablet Take 1 tablet by mouth once daily, or as directed 30 tablet 5   No current facility-administered medications for this visit.   Allergies  Allergen Reactions  . Celecoxib Other (See Comments)    Per Mar  . Penicillins Other (See Comments)    Gi upset   . Sulfa Antibiotics Swelling

## 2014-07-18 NOTE — Progress Notes (Signed)
[place], [place], [place]  [address] Phone: [phone] Fax:  [phone]  Date:  07/19/2014   ID:  Hayden [name], DOB [birth year], MRN [id number]  PCP:  Lynne Logan, MD  Cardiologist:  Fransico Him, MD    History of Present Illness: Hayden [name] is a 68 y.o. male with a history of ASCAD, chronic diastolic CHF, HTN and dyslipidemia who presents today for followup. He is doing well. He denies any chest pain, SOB, DOE, LE edema, dizziness, palpitations or syncope.  He exercises on a recumbent bike, treadmill and total gym that he works out on. He is also working out in the yard without any problems and says he feel great.   Wt Readings from Last 3 Encounters:  07/19/14 180 lb (81.647 kg)  01/17/14 185 lb (83.915 kg)  07/05/13 184 lb (83.462 kg)     Past Medical History  Diagnosis Date  . PONV (postoperative nausea and vomiting)     pt prefers epidural is possible  . Impaired hearing     both ears  . Myocardial infarction 10-31-1989  . H/O hiatal hernia   . Arthritis   . Bright's disease age 81  . Kidney stones     89  in past plus stones  . Frequent urination   . Hypogonadism male     No details available but receives replacement therapy  . Anxiety disorder     No details  . Opiate addiction   . Coronary artery disease     MI in 1994 with PTCA of left circ and then coronary bypass 2000  . Hypertension   . Hyperlipidemia     statin intolerant  . Chronic diastolic CHF (congestive heart failure)     Current Outpatient Prescriptions  Medication Sig Dispense Refill  . Alirocumab (PRALUENT) 75 MG/ML SOPN Inject 75 mg into the skin every 14 (fourteen) days.    Marland Kitchen amLODipine (NORVASC) 5 MG tablet TAKE 1 AND 1/2 TABLETS BY MOUTH ONCE DAILY 45 tablet 4  . aspirin EC 81 MG tablet Take 81 mg by mouth daily.    Marland Kitchen BENICAR 40 MG tablet take 1 tablet by mouth once daily 30 tablet 6  . bisoprolol (ZEBETA) 5 MG tablet Take 1/2 tablet daily 15 tablet 11  .  Cholecalciferol (VITAMIN D) 2000 UNITS tablet Take 2,000 Units by mouth daily.    . Multiple Vitamin (MULTIVITAMIN WITH MINERALS) TABS Take 1 tablet by mouth daily.    . nitroGLYCERIN (NITROSTAT) 0.4 MG SL tablet Place 0.4 mg under the tongue every 5 (five) minutes as needed. For chest pain.    Marland Kitchen omeprazole (PRILOSEC) 20 MG capsule Take 20 mg by mouth daily.    . simvastatin (ZOCOR) 5 MG tablet Take 1 tablet by mouth once daily, or as directed 30 tablet 5   No current facility-administered medications for this visit.    Allergies:    Allergies  Allergen Reactions  . Celecoxib Other (See Comments)    Per Mar  . Penicillins Other (See Comments)    Gi upset   . Sulfa Antibiotics Swelling    Social History:  The patient  reports that he has never smoked. He has never used smokeless tobacco. He reports that he uses illicit drugs. He reports that he does not drink alcohol.   Family History:  The patient's family history includes Cancer - Lung in his father and mother; Heart attack in his father and mother.   ROS:  Please see the history of present illness.      All other systems reviewed and negative.   PHYSICAL EXAM: VS:  BP 138/68 mmHg  Pulse 79  Ht 5\' 8"  (1.727 m)  Wt 180 lb (81.647 kg)  BMI 27.38 kg/m2 Well nourished, well developed, in no acute distress HEENT: normal Neck: no JVD Cardiac:  normal S1, S2; RRR; no murmur Lungs:  clear to auscultation bilaterally, no wheezing, rhonchi or rales Abd: soft, nontender, no hepatomegaly Ext: no edema Skin: warm and dry Neuro:  CNs 2-12 intact, no focal abnormalities noted  ASSESSMENT AND PLAN:  1. ASCAD with no angina - continue ASA  2. HTN - well controlled - continue Amlodipin/Bisoprolol/Olmesartan  - check BMET 3. Dyslipidemia - He is intolerant to statins.LDL is at goal at 70.  He is now on PCSK 9 inhibitor.  He has started working out.       4.  Chronic diastolic dysfunction - appears euvolemic today.  Continue  BB/ARB  Followup with me in 6 months     Signed, Fransico Him, MD [facility] 07/19/2014 8:34 AM

## 2014-07-19 ENCOUNTER — Encounter: Payer: Self-pay | Admitting: Cardiology

## 2014-07-19 ENCOUNTER — Ambulatory Visit (INDEPENDENT_AMBULATORY_CARE_PROVIDER_SITE_OTHER): Payer: 59 | Admitting: Cardiology

## 2014-07-19 ENCOUNTER — Telehealth: Payer: Self-pay | Admitting: Cardiology

## 2014-07-19 VITALS — BP 138/68 | HR 79 | Ht 68.0 in | Wt 180.0 lb

## 2014-07-19 DIAGNOSIS — I2583 Coronary atherosclerosis due to lipid rich plaque: Principal | ICD-10-CM

## 2014-07-19 DIAGNOSIS — I2510 Atherosclerotic heart disease of native coronary artery without angina pectoris: Secondary | ICD-10-CM

## 2014-07-19 DIAGNOSIS — E785 Hyperlipidemia, unspecified: Secondary | ICD-10-CM

## 2014-07-19 DIAGNOSIS — I10 Essential (primary) hypertension: Secondary | ICD-10-CM

## 2014-07-19 DIAGNOSIS — I5032 Chronic diastolic (congestive) heart failure: Secondary | ICD-10-CM

## 2014-07-19 LAB — BASIC METABOLIC PANEL
BUN: 15 mg/dL (ref 6–23)
CO2: 27 meq/L (ref 19–32)
CREATININE: 0.9 mg/dL (ref 0.4–1.5)
Calcium: 9.2 mg/dL (ref 8.4–10.5)
Chloride: 104 mEq/L (ref 96–112)
GFR: 93.73 mL/min (ref >60.00)
GLUCOSE: 146 mg/dL — AB (ref 70–99)
Potassium: 3.6 mEq/L (ref 3.5–5.1)
SODIUM: 138 meq/L (ref 135–145)

## 2014-07-19 NOTE — Telephone Encounter (Signed)
New Message  Pt called requests a call back to discuss the replacement for Benicar is Telmisartan. This is the only replacement his insurance will pay for. Please call

## 2014-07-19 NOTE — Telephone Encounter (Signed)
To Gay Filler for verification this is OK to switch.

## 2014-07-19 NOTE — Patient Instructions (Signed)
Your physician recommends that you have lab work TODAY (BMET).  Your physician wants you to follow-up in: 6 months with Dr. Radford Pax. You will receive a reminder letter in the mail two months in advance. If you don't receive a letter, please call our office to schedule the follow-up appointment.

## 2014-07-22 MED ORDER — TELMISARTAN 80 MG PO TABS: 80.0000 mg | ORAL_TABLET | Freq: Every day | ORAL | Status: DC

## 2014-07-22 NOTE — Telephone Encounter (Signed)
Okay to change.  Would suggest Micardis (telmisartan) 80mg  daily as the equivalent dose of Benicar 40mg  daily

## 2014-07-22 NOTE — Telephone Encounter (Signed)
Per Dr. Radford Pax and Gay Filler, [facility], informed patient new Rx for telmisartin 80 mg is being called to his pharmacy of choice.

## 2014-08-06 ENCOUNTER — Telehealth: Payer: Self-pay | Admitting: Cardiology

## 2014-08-06 NOTE — Telephone Encounter (Signed)
New Message  Pt had called asking about his Preluent medication. Please call back and discuss.

## 2014-08-06 NOTE — Telephone Encounter (Signed)
Spoke with pt.  He has not been approved through insurance yet for Praluent so he needs more samples from the office.  His next injection is due on 12/15 so he will come sometime before then to get more.

## 2014-09-13 ENCOUNTER — Telehealth: Payer: Self-pay | Admitting: Pharmacist

## 2014-09-13 NOTE — Telephone Encounter (Signed)
New message      Talk to Gay Filler regarding praluent

## 2014-09-16 NOTE — Telephone Encounter (Signed)
Spoke with pt's wife.  Pt got a shipment of Praluent from a [facility] in [place] with a bill for ~$34.00.  He has been getting it free of charge.  Will look into why shipment came from a different pharmacy and why there is a charge.

## 2014-10-10 NOTE — Telephone Encounter (Signed)
Spoke with pt.  Explained new pharmacy recently merged with [facility] and that is why his Rx was coming from a different place.  Suggested he call [employer] team and make sure they send the discount card to his new pharmacy.  He was given the toll-free number to call himself.

## 2014-10-14 ENCOUNTER — Other Ambulatory Visit: Payer: Self-pay | Admitting: Nurse Practitioner

## 2014-10-14 MED ORDER — AMLODIPINE BESYLATE 5 MG PO TABS: ORAL_TABLET | ORAL | Status: DC

## 2014-10-21 ENCOUNTER — Encounter: Payer: Self-pay | Admitting: Cardiology

## 2014-10-24 ENCOUNTER — Telehealth: Payer: Self-pay | Admitting: Cardiology

## 2014-10-24 NOTE — Telephone Encounter (Signed)
New Message        Pt calling stating that he is returning [place] phone call. Please call back and advise.

## 2014-10-29 NOTE — Telephone Encounter (Signed)
Spoke with pt.  Made 6 month follow up appt for [facility]

## 2014-11-11 ENCOUNTER — Other Ambulatory Visit: Payer: Self-pay

## 2014-11-11 MED ORDER — BISOPROLOL FUMARATE 5 MG PO TABS: ORAL_TABLET | ORAL | Status: DC

## 2015-01-01 ENCOUNTER — Other Ambulatory Visit (INDEPENDENT_AMBULATORY_CARE_PROVIDER_SITE_OTHER): Payer: 59 | Admitting: *Deleted

## 2015-01-01 DIAGNOSIS — E785 Hyperlipidemia, unspecified: Secondary | ICD-10-CM

## 2015-01-01 LAB — LIPID PANEL
CHOLESTEROL: 109 mg/dL (ref 0–200)
HDL: 32.4 mg/dL — ABNORMAL LOW (ref >39.00)
LDL CALC: 45 mg/dL (ref 0–99)
NonHDL: 76.6
Total CHOL/HDL Ratio: 3
Triglycerides: 159 mg/dL — ABNORMAL HIGH (ref 0.0–149.0)
VLDL: 31.8 mg/dL (ref 0.0–40.0)

## 2015-01-01 LAB — HEPATIC FUNCTION PANEL
ALBUMIN: 4.1 g/dL (ref 3.5–5.2)
ALK PHOS: 77 U/L (ref 39–117)
ALT: 51 U/L (ref 0–53)
AST: 37 U/L (ref 0–37)
Bilirubin, Direct: 0.1 mg/dL (ref 0.0–0.3)
Total Bilirubin: 0.5 mg/dL (ref 0.2–1.2)
Total Protein: 7.2 g/dL (ref 6.0–8.3)

## 2015-01-01 NOTE — Addendum Note (Signed)
Addended by: Eulis Foster on: 01/01/2015 07:36 AM   Modules accepted: Orders

## 2015-01-03 ENCOUNTER — Ambulatory Visit: Payer: Self-pay | Admitting: Pharmacist

## 2015-01-15 ENCOUNTER — Encounter: Payer: Self-pay | Admitting: Cardiology

## 2015-01-15 ENCOUNTER — Ambulatory Visit (INDEPENDENT_AMBULATORY_CARE_PROVIDER_SITE_OTHER): Payer: 59 | Admitting: Cardiology

## 2015-01-15 VITALS — BP 130/72 | HR 66 | Ht 68.0 in | Wt 183.2 lb

## 2015-01-15 DIAGNOSIS — I5032 Chronic diastolic (congestive) heart failure: Secondary | ICD-10-CM | POA: Diagnosis not present

## 2015-01-15 DIAGNOSIS — I10 Essential (primary) hypertension: Secondary | ICD-10-CM | POA: Diagnosis not present

## 2015-01-15 DIAGNOSIS — E785 Hyperlipidemia, unspecified: Secondary | ICD-10-CM

## 2015-01-15 DIAGNOSIS — I2510 Atherosclerotic heart disease of native coronary artery without angina pectoris: Secondary | ICD-10-CM | POA: Diagnosis not present

## 2015-01-15 LAB — BASIC METABOLIC PANEL
BUN: 25 mg/dL — AB (ref 6–23)
CALCIUM: 9.6 mg/dL (ref 8.4–10.5)
CO2: 26 mEq/L (ref 19–32)
Chloride: 101 mEq/L (ref 96–112)
Creatinine, Ser: 0.89 mg/dL (ref 0.40–1.50)
GFR: 89.96 mL/min (ref >60.00)
Glucose, Bld: 105 mg/dL — ABNORMAL HIGH (ref 70–99)
Potassium: 4.1 mEq/L (ref 3.5–5.1)
SODIUM: 134 meq/L — AB (ref 135–145)

## 2015-01-15 NOTE — Patient Instructions (Signed)

## 2015-01-15 NOTE — Progress Notes (Signed)
Cardiology Office Note   Date:  01/15/2015   ID:  Hayden [name], DOB Jun 30, 1946, MRN [id number]  PCP:  Lynne Logan, MD    Chief Complaint  Patient presents with  . Follow-up    CAD, CHF      History of Present Illness: Hayden [name] is a 69 y.o. male with a history of ASCAD, chronic diastolic CHF, HTN and dyslipidemia who presents today for followup. He is doing well. He denies any chest pain, SOB, DOE, LE edema, dizziness, palpitations or syncope.  He exercises on a recumbent bike, treadmill and total gym that he works out on. He is also working out in the yard without any problems and says he feel great.     Past Medical History  Diagnosis Date  . PONV (postoperative nausea and vomiting)     pt prefers epidural is possible  . Impaired hearing     both ears  . Myocardial infarction 10-31-1989  . H/O hiatal hernia   . Arthritis   . Bright's disease age 69  . Kidney stones     100  in past plus stones  . Frequent urination   . Hypogonadism male     No details available but receives replacement therapy  . Anxiety disorder     No details  . Opiate addiction   . Coronary artery disease     MI in 1994 with PTCA of left circ and then coronary bypass 2000  . Hypertension   . Hyperlipidemia     statin intolerant  . Chronic diastolic CHF (congestive heart failure)     Past Surgical History  Procedure Laterality Date  . Coronary artery bypass graft  10-2998    cabg x 3  . Nissen fundoplication  6546  . Lithortripsy      x 3  . Cystoscopy      10 to 12 times in past  . Lumbar laminectomy  L 3 to L 4    Jul 01 1995  . Hernia repair  1998    with mesh  . Bilateral knee arthroscopy  yrs ago  . Angioplasty  1991  . Cardiac catheterization  01-17-07  . Total knee arthroplasty  10/27/2011    Procedure: TOTAL KNEE ARTHROPLASTY;  Surgeon: Gearlean Alf, MD;  Location: [facility];  Service: Orthopedics;  Laterality: Right;  . Coronary angioplasty       Current  Outpatient Prescriptions  Medication Sig Dispense Refill  . Alirocumab (PRALUENT) 75 MG/ML SOPN Inject 75 mg into the skin every 14 (fourteen) days.    Marland Kitchen amLODipine (NORVASC) 5 MG tablet TAKE 1 AND 1/2 TABLETS BY MOUTH ONCE DAILY 45 tablet 6  . aspirin EC 81 MG tablet Take 81 mg by mouth daily.    . bisoprolol (ZEBETA) 5 MG tablet Take 1/2 tablet daily 15 tablet 2  . Cholecalciferol (VITAMIN D) 2000 UNITS tablet Take 2,000 Units by mouth daily.    Marland Kitchen esomeprazole (NEXIUM) 40 MG capsule Take 40 mg by mouth daily.  1  . Multiple Vitamin (MULTIVITAMIN WITH MINERALS) TABS Take 1 tablet by mouth daily.    Marland Kitchen omeprazole (PRILOSEC) 20 MG capsule Take 20 mg by mouth daily.    Marland Kitchen telmisartan (MICARDIS) 80 MG tablet Take 1 tablet (80 mg total) by mouth daily. 30 tablet 6  . nitroGLYCERIN (NITROSTAT) 0.4 MG SL tablet Place 0.4 mg under the tongue every 5 (five) minutes as needed. For chest pain.  No current facility-administered medications for this visit.    Allergies:   Celecoxib; Penicillins; and Sulfa antibiotics    Social History:  The patient  reports that he has never smoked. He has never used smokeless tobacco. He reports that he uses illicit drugs. He reports that he does not drink alcohol.   Family History:  The patient's family history includes Cancer - Lung in his father and mother; Heart attack in his father and mother.    ROS:  Please see the history of present illness.   Otherwise, review of systems are positive for none.   All other systems are reviewed and negative.    PHYSICAL EXAM: VS:  BP 130/72 mmHg  Pulse 66  Ht 5\' 8"  (1.727 m)  Wt 183 lb 3.2 oz (83.099 kg)  BMI 27.86 kg/m2 , BMI Body mass index is 27.86 kg/(m^2). GEN: Well nourished, well developed, in no acute distress HEENT: normal Neck: no JVD, carotid bruits, or masses Cardiac: RRR; no murmurs, rubs, or gallops,no edema  Respiratory:  clear to auscultation bilaterally, normal work of breathing GI: soft,  nontender, nondistended, + BS MS: no deformity or atrophy Skin: warm and dry, no rash Neuro:  Strength and sensation are intact Psych: euthymic mood, full affect   EKG:  EKG is ordered today. The ekg ordered today demonstrates NSR with St/T wave abnormality in the inferolateral leads   Recent Labs: 07/19/2014: BUN 15; Creatinine 0.9; Potassium 3.6; Sodium 138 01/01/2015: ALT 51    Lipid Panel    Component Value Date/Time   CHOL 109 01/01/2015 0737   CHOL 121 06/28/2013 0809   TRIG 159.0* 01/01/2015 0737   TRIG 106 06/28/2013 0809   HDL 32.40* 01/01/2015 0737   HDL 37* 06/28/2013 0809   CHOLHDL 3 01/01/2015 0737   VLDL 31.8 01/01/2015 0737   LDLCALC 45 01/01/2015 0737   LDLCALC 63 06/28/2013 0809      Wt Readings from Last 3 Encounters:  01/15/15 183 lb 3.2 oz (83.099 kg)  07/19/14 180 lb (81.647 kg)  01/17/14 185 lb (83.915 kg)     ASSESSMENT AND PLAN:  1. ASCAD with no angina - continue ASA  2. HTN - well controlled - continue Amlodipin/Bisoprolol/Micardis  - check BMET 3. Dyslipidemia - He is intolerant to statins.LDL is at goal at 45. He is now on PCSK 9 inhibitor.   4. Chronic diastolic dysfunction - appears euvolemic today. Continue BB/ARB    Current medicines are reviewed at length with the patient today.  The patient does not have concerns regarding medicines.  The following changes have been made:  no change  Labs/ tests ordered today include: see above assessment and plan No orders of the defined types were placed in this encounter.     Disposition:   FU with me in 6 months   Signed, Sueanne Margarita, MD  01/15/2015 8:59 AM    [facility] [place], [place], [place]  [address] Phone: [phone]; Fax: [phone]

## 2015-02-05 ENCOUNTER — Other Ambulatory Visit: Payer: Self-pay

## 2015-02-05 MED ORDER — BISOPROLOL FUMARATE 5 MG PO TABS: ORAL_TABLET | ORAL | Status: DC

## 2015-02-05 NOTE — Telephone Encounter (Signed)
Per note 5. 18.16

## 2015-03-17 ENCOUNTER — Other Ambulatory Visit: Payer: Self-pay

## 2015-03-17 MED ORDER — TELMISARTAN 80 MG PO TABS: 80.0000 mg | ORAL_TABLET | Freq: Every day | ORAL | Status: DC

## 2015-07-01 ENCOUNTER — Other Ambulatory Visit: Payer: Self-pay | Admitting: Pharmacist

## 2015-07-01 MED ORDER — ALIROCUMAB 75 MG/ML ~~LOC~~ SOPN: 75.0000 mg | PEN_INJECTOR | SUBCUTANEOUS | Status: DC

## 2015-07-10 ENCOUNTER — Telehealth: Payer: Self-pay | Admitting: Cardiology

## 2015-07-10 DIAGNOSIS — E785 Hyperlipidemia, unspecified: Secondary | ICD-10-CM

## 2015-07-10 DIAGNOSIS — I10 Essential (primary) hypertension: Secondary | ICD-10-CM

## 2015-07-10 NOTE — Telephone Encounter (Signed)
New message      Pt has an appt on Monday with Dr Radford Pax.  He want to come in tomorrow for blood work but there is no order in computer.  He is on bp medication.  Please call pt and let him know if he needs to come in tomorrow for labs.  OK to leave message on vm

## 2015-07-10 NOTE — Telephone Encounter (Signed)
BMET and FLP and ALT needed

## 2015-07-10 NOTE — Telephone Encounter (Signed)
BMET, FLP and ALT ordered and scheduled for tomorrow. Confirmed with patient's wife he is to come fasting.

## 2015-07-11 ENCOUNTER — Other Ambulatory Visit: Payer: 59 | Admitting: *Deleted

## 2015-07-11 DIAGNOSIS — I10 Essential (primary) hypertension: Secondary | ICD-10-CM

## 2015-07-11 DIAGNOSIS — E785 Hyperlipidemia, unspecified: Secondary | ICD-10-CM

## 2015-07-11 LAB — LIPID PANEL
Cholesterol: 160 mg/dL (ref 125–200)
HDL: 31 mg/dL — ABNORMAL LOW (ref >=40)
LDL Cholesterol: 111 mg/dL (ref <130)
Total CHOL/HDL Ratio: 5.2 Ratio — ABNORMAL HIGH (ref <=5.0)
Triglycerides: 91 mg/dL (ref <150)
VLDL: 18 mg/dL (ref <30)

## 2015-07-11 LAB — BASIC METABOLIC PANEL
BUN: 12 mg/dL (ref 7–25)
CALCIUM: 9.7 mg/dL (ref 8.6–10.3)
CO2: 25 mmol/L (ref 20–31)
Chloride: 104 mmol/L (ref 98–110)
Creat: 0.82 mg/dL (ref 0.70–1.25)
GLUCOSE: 89 mg/dL (ref 65–99)
Potassium: 4.2 mmol/L (ref 3.5–5.3)
SODIUM: 142 mmol/L (ref 135–146)

## 2015-07-11 LAB — ALT: ALT: 22 U/L (ref 9–46)

## 2015-07-13 NOTE — Progress Notes (Signed)
Cardiology Office Note   Date:  07/14/2015   ID:  Hayden [name], DOB 1946/02/20, MRN [id number]  PCP:  Lynne Logan, MD    Chief Complaint  Patient presents with  . Coronary Artery Disease  . Hypertension  . Hyperlipidemia      History of Present Illness: Hayden [name] is a 69 y.o. male with a history of ASCAD, chronic diastolic CHF, HTN and dyslipidemia who presents today for followup. He is doing well. He denies any chest pain, SOB, DOE, LE edema, dizziness, palpitations or syncope.  He exercises walking 6 miles daily.    Past Medical History  Diagnosis Date  . PONV (postoperative nausea and vomiting)     pt prefers epidural is possible  . Impaired hearing     both ears  . Myocardial infarction ([place]) 10-31-1989  . H/O hiatal hernia   . Arthritis   . Bright's disease age 60  . Kidney stones     100  in past plus stones  . Frequent urination   . Hypogonadism male     No details available but receives replacement therapy  . Anxiety disorder     No details  . Opiate addiction ([place])   . Coronary artery disease     MI in 1994 with PTCA of left circ and then coronary bypass 2000  . Hypertension   . Hyperlipidemia     statin intolerant  . Chronic diastolic CHF (congestive heart failure) [facility])     Past Surgical History  Procedure Laterality Date  . Coronary artery bypass graft  10-2998    cabg x 3  . Nissen fundoplication  123456  . Lithortripsy      x 3  . Cystoscopy      10 to 12 times in past  . Lumbar laminectomy  L 3 to L 4    Jul 01 1995  . Hernia repair  1998    with mesh  . Bilateral knee arthroscopy  yrs ago  . Angioplasty  1991  . Cardiac catheterization  01-17-07  . Total knee arthroplasty  10/27/2011    Procedure: TOTAL KNEE ARTHROPLASTY;  Surgeon: Gearlean Alf, MD;  Location: [facility];  Service: Orthopedics;  Laterality: Right;  . Coronary angioplasty       Current Outpatient Prescriptions  Medication Sig Dispense Refill  .  Alirocumab (PRALUENT) 75 MG/ML SOPN Inject 75 mg into the skin every 14 (fourteen) days. 2 pen 11  . amLODipine (NORVASC) 5 MG tablet TAKE 1 AND 1/2 TABLETS BY MOUTH ONCE DAILY 45 tablet 6  . aspirin EC 81 MG tablet Take 81 mg by mouth daily.    . bisoprolol (ZEBETA) 5 MG tablet Take 1/2 tablet daily 15 tablet 6  . Cholecalciferol (VITAMIN D) 2000 UNITS tablet Take 2,000 Units by mouth daily.    Marland Kitchen esomeprazole (NEXIUM) 40 MG capsule Take 40 mg by mouth daily.  1  . Multiple Vitamin (MULTIVITAMIN WITH MINERALS) TABS Take 1 tablet by mouth daily.    . nitroGLYCERIN (NITROSTAT) 0.4 MG SL tablet Place 0.4 mg under the tongue every 5 (five) minutes as needed. For chest pain.    Marland Kitchen omeprazole (PRILOSEC) 20 MG capsule Take 20 mg by mouth daily.    Marland Kitchen telmisartan (MICARDIS) 80 MG tablet Take 1 tablet (80 mg total) by mouth daily. 30 tablet 6   No current facility-administered medications for  this visit.    Allergies:   Celecoxib; Penicillins; and Sulfa antibiotics    Social History:  The patient  reports that he has never smoked. He has never used smokeless tobacco. He reports that he uses illicit drugs. He reports that he does not drink alcohol.   Family History:  The patient's family history includes Cancer - Lung in his father and mother; Heart attack in his father and mother.    ROS:  Please see the history of present illness.   Otherwise, review of systems are positive for none.   All other systems are reviewed and negative.    PHYSICAL EXAM: VS:  BP 138/86 mmHg  Pulse 97  Ht 5\' 8"  (1.727 m)  Wt 180 lb 9.6 oz (81.92 kg)  BMI 27.47 kg/m2  SpO2 74% , BMI Body mass index is 27.47 kg/(m^2). GEN: Well nourished, well developed, in no acute distress HEENT: normal Neck: no JVD, carotid bruits, or masses Cardiac: RRR; no murmurs, rubs, or gallops,no edema  Respiratory:  clear to auscultation bilaterally, normal work of breathing GI: soft, nontender, nondistended, + BS MS: no deformity or  atrophy Skin: warm and dry, no rash Neuro:  Strength and sensation are intact Psych: euthymic mood, full affect   EKG:  EKG is not ordered today.    Recent Labs: 07/11/2015: ALT 22; BUN 12; Creat 0.82; Potassium 4.2; Sodium 142    Lipid Panel    Component Value Date/Time   CHOL 160 07/11/2015 0743   CHOL 121 06/28/2013 0809   TRIG 91 07/11/2015 0743   TRIG 106 06/28/2013 0809   HDL 31* 07/11/2015 0743   HDL 37* 06/28/2013 0809   CHOLHDL 5.2* 07/11/2015 0743   VLDL 18 07/11/2015 0743   LDLCALC 111 07/11/2015 0743   LDLCALC 63 06/28/2013 0809      Wt Readings from Last 3 Encounters:  07/14/15 180 lb 9.6 oz (81.92 kg)  01/15/15 183 lb 3.2 oz (83.099 kg)  07/19/14 180 lb (81.647 kg)     ASSESSMENT AND PLAN:  1. ASCAD with no angina - continue ASA/BB 2. HTN - well controlled - continue Amlodipine/Bisoprolol/Micardis  3. Dyslipidemia - He is intolerant to statins.LDL is not at goal at 111. He is now on PCSK 9 inhibitor.Followup in lipid clinic.  4. Chronic diastolic dysfunction - appears euvolemic today. Continue BB/ARB   Current medicines are reviewed at length with the patient today.  The patient does not have concerns regarding medicines.  The following changes have been made:  no change  Labs/ tests ordered today: See above Assessment and Plan No orders of the defined types were placed in this encounter.     Disposition:   FU with me in 1 year  Signed, Sueanne Margarita, MD  07/14/2015 8:39 AM    [facility] [place], [place], [place]  [address] Phone: [phone]; Fax: [phone]

## 2015-07-14 ENCOUNTER — Ambulatory Visit (INDEPENDENT_AMBULATORY_CARE_PROVIDER_SITE_OTHER): Payer: 59 | Admitting: Cardiology

## 2015-07-14 ENCOUNTER — Encounter: Payer: Self-pay | Admitting: Cardiology

## 2015-07-14 VITALS — BP 138/86 | HR 97 | Ht 68.0 in | Wt 180.6 lb

## 2015-07-14 DIAGNOSIS — E785 Hyperlipidemia, unspecified: Secondary | ICD-10-CM | POA: Diagnosis not present

## 2015-07-14 DIAGNOSIS — I2510 Atherosclerotic heart disease of native coronary artery without angina pectoris: Secondary | ICD-10-CM | POA: Diagnosis not present

## 2015-07-14 DIAGNOSIS — I5032 Chronic diastolic (congestive) heart failure: Secondary | ICD-10-CM

## 2015-07-14 DIAGNOSIS — I2583 Coronary atherosclerosis due to lipid rich plaque: Principal | ICD-10-CM

## 2015-07-14 DIAGNOSIS — I10 Essential (primary) hypertension: Secondary | ICD-10-CM | POA: Diagnosis not present

## 2015-07-14 NOTE — Patient Instructions (Signed)
Medication Instructions:  Your physician recommends that you continue on your current medications as directed. Please refer to the Current Medication list given to you today.   Labwork: Your physician recommends that you return for FASTING lab work in [place].  Testing/Procedures: None  Follow-Up: Your physician wants you to follow-up in: 1 year with Dr. Radford Pax. You will receive a reminder letter in the mail two months in advance. If you don't receive a letter, please call our office to schedule the follow-up appointment.   Any Other Special Instructions Will Be Listed Below (If Applicable).     If you need a refill on your cardiac medications before your next appointment, please call your pharmacy.

## 2015-09-05 ENCOUNTER — Telehealth: Payer: Self-pay | Admitting: *Deleted

## 2015-09-05 ENCOUNTER — Other Ambulatory Visit (INDEPENDENT_AMBULATORY_CARE_PROVIDER_SITE_OTHER): Payer: 59 | Admitting: *Deleted

## 2015-09-05 ENCOUNTER — Other Ambulatory Visit: Payer: Self-pay | Admitting: Cardiology

## 2015-09-05 DIAGNOSIS — E785 Hyperlipidemia, unspecified: Secondary | ICD-10-CM

## 2015-09-05 LAB — LIPID PANEL
Cholesterol: 98 mg/dL — ABNORMAL LOW (ref 125–200)
HDL: 40 mg/dL (ref >=40)
LDL Cholesterol: 43 mg/dL (ref <130)
Total CHOL/HDL Ratio: 2.5 Ratio (ref <=5.0)
Triglycerides: 73 mg/dL (ref <150)
VLDL: 15 mg/dL (ref <30)

## 2015-09-05 LAB — HEPATIC FUNCTION PANEL
ALT: 22 U/L (ref 9–46)
AST: 18 U/L (ref 10–35)
Albumin: 4.3 g/dL (ref 3.6–5.1)
Alkaline Phosphatase: 73 U/L (ref 40–115)
Bilirubin, Direct: 0.2 mg/dL (ref <=0.2)
Indirect Bilirubin: 0.5 mg/dL (ref 0.2–1.2)
Total Bilirubin: 0.7 mg/dL (ref 0.2–1.2)
Total Protein: 7.1 g/dL (ref 6.1–8.1)

## 2015-09-05 MED ORDER — BISOPROLOL FUMARATE 5 MG PO TABS: ORAL_TABLET | ORAL | Status: DC

## 2015-09-05 MED ORDER — AMLODIPINE BESYLATE 5 MG PO TABS: ORAL_TABLET | ORAL | Status: DC

## 2015-09-05 NOTE — Telephone Encounter (Signed)
Pt has been notified of lab results by phone with verbal understanding. 

## 2015-09-05 NOTE — Addendum Note (Signed)
Addended by: BOWDEN, ROBIN K on: 09/05/2015 07:39 AM   Modules accepted: Orders  

## 2015-09-09 ENCOUNTER — Other Ambulatory Visit: Payer: Self-pay | Admitting: Cardiology

## 2015-09-09 MED ORDER — BISOPROLOL FUMARATE 5 MG PO TABS: ORAL_TABLET | ORAL | Status: DC

## 2015-09-09 MED ORDER — AMLODIPINE BESYLATE 5 MG PO TABS: ORAL_TABLET | ORAL | Status: DC

## 2015-09-09 MED ORDER — TELMISARTAN 80 MG PO TABS: 80.0000 mg | ORAL_TABLET | Freq: Every day | ORAL | Status: DC

## 2015-09-10 ENCOUNTER — Other Ambulatory Visit: Payer: Self-pay | Admitting: Pharmacist

## 2015-09-10 MED ORDER — ALIROCUMAB 75 MG/ML ~~LOC~~ SOPN: 75.0000 mg | PEN_INJECTOR | SUBCUTANEOUS | Status: DC

## 2015-10-07 ENCOUNTER — Telehealth: Payer: Self-pay | Admitting: Pharmacist

## 2015-10-07 NOTE — Telephone Encounter (Signed)
Called pt to let him know that Praluent PA was approved. Rx already at [facility]. Pt will call them to set up shipment details.

## 2016-01-01 DIAGNOSIS — M180 Bilateral primary osteoarthritis of first carpometacarpal joints: Secondary | ICD-10-CM | POA: Diagnosis not present

## 2016-01-19 DIAGNOSIS — R05 Cough: Secondary | ICD-10-CM | POA: Diagnosis not present

## 2016-03-23 ENCOUNTER — Telehealth: Payer: Self-pay | Admitting: Cardiology

## 2016-03-23 NOTE — Telephone Encounter (Signed)
New message  Pt call requesting to speak with RN about [employer]. Pt requested to only speak with Gay Filler. Please call back to discuss

## 2016-03-23 NOTE — Telephone Encounter (Signed)
Spoke with pt.  He has changed insurance companies and his Praluent is now $900 per month.  He may qualify for CLEAR study.  He is interested in this.  Will send his information to [facility] for screening.

## 2016-04-16 ENCOUNTER — Other Ambulatory Visit: Payer: PPO | Admitting: *Deleted

## 2016-04-16 ENCOUNTER — Encounter (INDEPENDENT_AMBULATORY_CARE_PROVIDER_SITE_OTHER): Payer: Self-pay

## 2016-04-16 DIAGNOSIS — E785 Hyperlipidemia, unspecified: Secondary | ICD-10-CM | POA: Diagnosis not present

## 2016-04-16 LAB — HEPATIC FUNCTION PANEL
ALBUMIN: 4.2 g/dL (ref 3.6–5.1)
ALT: 25 U/L (ref 9–46)
AST: 21 U/L (ref 10–35)
Alkaline Phosphatase: 57 U/L (ref 40–115)
BILIRUBIN DIRECT: 0.1 mg/dL (ref <=0.2)
BILIRUBIN TOTAL: 0.5 mg/dL (ref 0.2–1.2)
Indirect Bilirubin: 0.4 mg/dL (ref 0.2–1.2)
Total Protein: 6.8 g/dL (ref 6.1–8.1)

## 2016-04-16 LAB — LIPID PANEL
CHOL/HDL RATIO: 2.8 ratio (ref <=5.0)
Cholesterol: 99 mg/dL — ABNORMAL LOW (ref 125–200)
HDL: 36 mg/dL — AB (ref >=40)
LDL CALC: 40 mg/dL (ref <130)
Triglycerides: 114 mg/dL (ref <150)
VLDL: 23 mg/dL (ref <30)

## 2016-04-21 DIAGNOSIS — L821 Other seborrheic keratosis: Secondary | ICD-10-CM | POA: Diagnosis not present

## 2016-04-21 DIAGNOSIS — Z85828 Personal history of other malignant neoplasm of skin: Secondary | ICD-10-CM | POA: Diagnosis not present

## 2016-04-21 DIAGNOSIS — L570 Actinic keratosis: Secondary | ICD-10-CM | POA: Diagnosis not present

## 2016-04-21 DIAGNOSIS — L820 Inflamed seborrheic keratosis: Secondary | ICD-10-CM | POA: Diagnosis not present

## 2016-05-04 DIAGNOSIS — M5090 Cervical disc disorder, unspecified, unspecified cervical region: Secondary | ICD-10-CM | POA: Diagnosis not present

## 2016-05-04 DIAGNOSIS — M1712 Unilateral primary osteoarthritis, left knee: Secondary | ICD-10-CM | POA: Diagnosis not present

## 2016-05-04 DIAGNOSIS — M1812 Unilateral primary osteoarthritis of first carpometacarpal joint, left hand: Secondary | ICD-10-CM | POA: Diagnosis not present

## 2016-05-10 ENCOUNTER — Telehealth: Payer: Self-pay | Admitting: Pharmacist

## 2016-05-10 NOTE — Telephone Encounter (Signed)
Pt calling to inquire about research trial Gay Filler had mentioned a few months ago. Will forward pt info to research group at [facility] to see if pt qualifies for CLEAR trial. Pt verbalized understanding.

## 2016-05-14 DIAGNOSIS — N200 Calculus of kidney: Secondary | ICD-10-CM | POA: Diagnosis not present

## 2016-06-21 ENCOUNTER — Telehealth: Payer: Self-pay | Admitting: Pharmacist Clinician (PhC)/ Clinical Pharmacy Specialist

## 2016-06-21 DIAGNOSIS — E785 Hyperlipidemia, unspecified: Secondary | ICD-10-CM

## 2016-06-21 NOTE — Telephone Encounter (Signed)
Patient called, wanted to set up time for repeat cholesterol labs.  He was previously on Praluent 75 mg q14 d, but his insurance went from commercial to Medicare (copay from $10/3 months to > $900/month).  Has spoke with [facility] about entering CLEAR trial, but needed time for lipid labs to go back to baseline.    Took last dose of Praluent on August 1.  Ordered labs, will have drawn on Nov 8.  At that time we can submit the information to [facility].

## 2016-07-04 DIAGNOSIS — J069 Acute upper respiratory infection, unspecified: Secondary | ICD-10-CM | POA: Diagnosis not present

## 2016-07-07 ENCOUNTER — Other Ambulatory Visit: Payer: PPO

## 2016-07-08 NOTE — Telephone Encounter (Signed)
Labs, lipid and hepatic to be checked on 07/13/2016

## 2016-07-12 DIAGNOSIS — R05 Cough: Secondary | ICD-10-CM | POA: Diagnosis not present

## 2016-07-13 ENCOUNTER — Other Ambulatory Visit: Payer: PPO | Admitting: *Deleted

## 2016-07-13 DIAGNOSIS — E785 Hyperlipidemia, unspecified: Secondary | ICD-10-CM

## 2016-07-13 LAB — HEPATIC FUNCTION PANEL
ALBUMIN: 4.1 g/dL (ref 3.6–5.1)
ALT: 25 U/L (ref 9–46)
AST: 18 U/L (ref 10–35)
Alkaline Phosphatase: 57 U/L (ref 40–115)
Bilirubin, Direct: 0.1 mg/dL (ref <=0.2)
Indirect Bilirubin: 0.6 mg/dL (ref 0.2–1.2)
TOTAL PROTEIN: 6.8 g/dL (ref 6.1–8.1)
Total Bilirubin: 0.7 mg/dL (ref 0.2–1.2)

## 2016-07-13 LAB — LIPID PANEL
CHOL/HDL RATIO: 7.2 ratio — AB (ref <5.0)
CHOLESTEROL: 195 mg/dL (ref <200)
HDL: 27 mg/dL — AB (ref >40)
LDL Cholesterol: 138 mg/dL — ABNORMAL HIGH (ref <100)
TRIGLYCERIDES: 148 mg/dL (ref <150)
VLDL: 30 mg/dL (ref <30)

## 2016-07-15 ENCOUNTER — Ambulatory Visit
Admission: RE | Admit: 2016-07-15 | Discharge: 2016-07-15 | Disposition: A | Discharge status: Home / Self Care | Payer: PPO | Source: Ambulatory Visit | Attending: Family Medicine | Admitting: Family Medicine

## 2016-07-15 ENCOUNTER — Other Ambulatory Visit: Payer: Self-pay | Admitting: Family Medicine

## 2016-07-15 DIAGNOSIS — R05 Cough: Secondary | ICD-10-CM | POA: Diagnosis not present

## 2016-07-15 DIAGNOSIS — R059 Cough, unspecified: Secondary | ICD-10-CM

## 2016-08-02 ENCOUNTER — Encounter: Payer: Self-pay | Admitting: *Deleted

## 2016-08-02 DIAGNOSIS — Z006 Encounter for examination for normal comparison and control in clinical research program: Secondary | ICD-10-CM

## 2016-08-02 NOTE — Progress Notes (Signed)
Late entry: Subject met inclusion and exclusion criteria. The informed consent form, study requirements and expectations were reviewed with the subject and questions and concerns were addressed prior to the signing of the consent form. The subject verbalized understanding of the trail requirements. The subject agreed to participate in the CLEARtrial and signed the informed consent. The informed consent was obtained prior to performance of any protocol-specific procedures for the subject. A copy of the signed informed consent was given to the subject and a copy was placed in the subject's medical record.        Jake Bathe, RN July 28, 2016 1005

## 2016-08-02 NOTE — Progress Notes (Signed)
Hi Hayden [name], pt is enrolled in [place] as of 07/28/2016.

## 2016-08-09 ENCOUNTER — Other Ambulatory Visit: Payer: Self-pay | Admitting: *Deleted

## 2016-08-09 MED ORDER — AMBULATORY NON FORMULARY MEDICATION: 180.0000 mg | Freq: Every day | Status: DC

## 2016-08-10 ENCOUNTER — Encounter: Payer: Self-pay | Admitting: *Deleted

## 2016-09-02 DIAGNOSIS — M5090 Cervical disc disorder, unspecified, unspecified cervical region: Secondary | ICD-10-CM | POA: Diagnosis not present

## 2016-09-02 DIAGNOSIS — M25521 Pain in right elbow: Secondary | ICD-10-CM | POA: Diagnosis not present

## 2016-09-02 DIAGNOSIS — M5030 Other cervical disc degeneration, unspecified cervical region: Secondary | ICD-10-CM | POA: Diagnosis not present

## 2016-09-06 ENCOUNTER — Other Ambulatory Visit: Payer: Self-pay | Admitting: *Deleted

## 2016-09-06 MED ORDER — AMBULATORY NON FORMULARY MEDICATION: 180.0000 mg | Freq: Every day | Status: DC

## 2016-09-07 ENCOUNTER — Ambulatory Visit: Payer: PPO | Admitting: Cardiology

## 2016-09-08 DIAGNOSIS — M542 Cervicalgia: Secondary | ICD-10-CM | POA: Diagnosis not present

## 2016-09-10 ENCOUNTER — Ambulatory Visit (INDEPENDENT_AMBULATORY_CARE_PROVIDER_SITE_OTHER): Payer: PPO | Admitting: Cardiology

## 2016-09-10 VITALS — BP 124/66 | HR 103 | Ht 68.0 in | Wt 181.0 lb

## 2016-09-10 DIAGNOSIS — R0989 Other specified symptoms and signs involving the circulatory and respiratory systems: Secondary | ICD-10-CM

## 2016-09-10 DIAGNOSIS — E7800 Pure hypercholesterolemia, unspecified: Secondary | ICD-10-CM | POA: Diagnosis not present

## 2016-09-10 DIAGNOSIS — I2510 Atherosclerotic heart disease of native coronary artery without angina pectoris: Secondary | ICD-10-CM

## 2016-09-10 DIAGNOSIS — I10 Essential (primary) hypertension: Secondary | ICD-10-CM | POA: Diagnosis not present

## 2016-09-10 DIAGNOSIS — I5032 Chronic diastolic (congestive) heart failure: Secondary | ICD-10-CM

## 2016-09-10 NOTE — Patient Instructions (Signed)
Medication Instructions:  Your physician recommends that you continue on your current medications as directed. Please refer to the Current Medication list given to you today.   Labwork: Your physician recommends that you have lab work today: bmet   Testing/Procedures: -None  Follow-Up: Your physician wants you to follow-up in: 1 year with Dr. Radford Pax.  You will receive a reminder letter in the mail two months in advance. If you don't receive a letter, please call our office to schedule the follow-up appointment.   Any Other Special Instructions Will Be Listed Below (If Applicable).     If you need a refill on your cardiac medications before your next appointment, please call your pharmacy.

## 2016-09-10 NOTE — Progress Notes (Signed)
Cardiology Office Note    Date:  09/10/2016   ID:  Hayden [name], DOB [birth year], MRN [id number]  PCP:  Lynne Logan, MD  Cardiologist:  Fransico Him, MD   Chief Complaint  Patient presents with  . Coronary Artery Disease  . Hypertension  . Hyperlipidemia  . Congestive Heart Failure    History of Present Illness:  Hayden [name] is a 71 y.o. male with a history of ASCAD, chronic diastolic CHF, HTN and dyslipidemia who presents today for followup. He is doing well. He denies any chest pain, SOB, DOE, LE edema, dizziness, palpitations or syncope.  He exercises walking 4-5 miles daily.     Past Medical History:  Diagnosis Date  . Anxiety disorder    No details  . Arthritis   . Bright's disease age 24  . Chronic diastolic CHF (congestive heart failure) ([place])   . Coronary artery disease    MI in 1994 with PTCA of left circ and then coronary bypass 2000  . Frequent urination   . H/O hiatal hernia   . Hyperlipidemia    statin intolerant  . Hypertension   . Hypogonadism male    No details available but receives replacement therapy  . Impaired hearing    both ears  . Kidney stones    100  in past plus stones  . Myocardial infarction 10-31-1989  . Opiate addiction ([place])   . PONV (postoperative nausea and vomiting)    pt prefers epidural is possible    Past Surgical History:  Procedure Laterality Date  . ANGIOPLASTY  1991  . BILATERAL KNEE ARTHROSCOPY  yrs ago  . CARDIAC CATHETERIZATION  01-17-07  . CORONARY ANGIOPLASTY    . CORONARY ARTERY BYPASS GRAFT  10-2998   cabg x 3  . CYSTOSCOPY     10 to 12 times in past  . [place]   with mesh  . lithortripsy     x 3  . LUMBAR LAMINECTOMY  L 3 to L 4   Jul 01 1995  . NISSEN FUNDOPLICATION  123456  . TOTAL KNEE ARTHROPLASTY  10/27/2011   Procedure: TOTAL KNEE ARTHROPLASTY;  Surgeon: Gearlean Alf, MD;  Location: [facility];  Service: Orthopedics;  Laterality: Right;    Current Medications: Outpatient Medications  Prior to Visit  Medication Sig Dispense Refill  . Alirocumab (PRALUENT) 75 MG/ML SOPN Inject 75 mg into the skin every 14 (fourteen) days. 6 pen 3  . AMBULATORY NON FORMULARY MEDICATION Take 180 mg by mouth daily. Medication Name: bempedoic acid vs a placebo, CLEAR Research Study drug provided    . amLODipine (NORVASC) 5 MG tablet TAKE 1 AND 1/2 TABLETS BY MOUTH ONCE DAILY (Patient taking differently: Take 5 mg by mouth daily. ) 135 tablet 3  . aspirin EC 81 MG tablet Take 81 mg by mouth daily.    . bisoprolol (ZEBETA) 5 MG tablet Take 1/2 tablet daily 45 tablet 3  . Cholecalciferol (VITAMIN D) 2000 UNITS tablet Take 2,000 Units by mouth daily.    . Multiple Vitamin (MULTIVITAMIN WITH MINERALS) TABS Take 1 tablet by mouth daily.    . nitroGLYCERIN (NITROSTAT) 0.4 MG SL tablet Place 0.4 mg under the tongue every 5 (five) minutes as needed. For chest pain.    Marland Kitchen omeprazole (PRILOSEC) 20 MG capsule Take 40 mg by mouth daily.     Marland Kitchen telmisartan (MICARDIS) 80 MG tablet Take 1 tablet (80 mg total) by mouth daily. 90 tablet 3  .  esomeprazole (NEXIUM) 40 MG capsule Take 40 mg by mouth daily.  1   No facility-administered medications prior to visit.      Allergies:   Celecoxib; Penicillins; Sulfa antibiotics; and Sulfamethoxazole   Social History   Social History  . Marital status: Married    Spouse name: N/A  . Number of children: N/A  . Years of education: N/A   Occupational History  . [employer]   Social History Main Topics  . Smoking status: Never Smoker  . Smokeless tobacco: Never Used  . Alcohol use No     Comment: quit ETOH in 2013  . Drug use:      Comment: from [facility]-currently detoxing off opiates  . Sexual activity: Not on file   Other Topics Concern  . Not on file   Social History Narrative  . No narrative on file     Family History:  The patient's family history includes Cancer - Lung in his father and mother; Heart attack in his father and  mother.   ROS:   Please see the history of present illness.    ROS All other systems reviewed and are negative.  No flowsheet data found.     PHYSICAL EXAM:   VS:  BP 124/66   Pulse (!) 103   Ht 5\' 8"  (1.727 m)   Wt 181 lb (82.1 kg)   BMI 27.52 kg/m    GEN: Well nourished, well developed, in no acute distress  HEENT: normal  Neck: no JVD, carotid bruits, or masses Cardiac: RRR; no murmurs, rubs, or gallops,no edema.  Intact distal pulses bilaterally.  Respiratory:  Crackles at left base otherwise clear GI: soft, nontender, nondistended, + BS MS: no deformity or atrophy  Skin: warm and dry, no rash Neuro:  Alert and Oriented x 3, Strength and sensation are intact Psych: euthymic mood, full affect  Wt Readings from Last 3 Encounters:  09/10/16 181 lb (82.1 kg)  07/14/15 180 lb 9.6 oz (81.9 kg)  01/15/15 183 lb 3.2 oz (83.1 kg)      Studies/Labs Reviewed:   EKG:  EKG is  ordered today.sinus tachycardia at 103bpm with nonspecific ST abnormality  Recent Labs: 07/13/2016: ALT 25   Lipid Panel    Component Value Date/Time   CHOL 195 07/13/2016 0805   CHOL 121 06/28/2013 0809   TRIG 148 07/13/2016 0805   TRIG 106 06/28/2013 0809   HDL 27 (L) 07/13/2016 0805   HDL 37 (L) 06/28/2013 0809   CHOLHDL 7.2 (H) 07/13/2016 0805   VLDL 30 07/13/2016 0805   LDLCALC 138 (H) 07/13/2016 0805   LDLCALC 63 06/28/2013 0809    Additional studies/ records that were reviewed today include:  none    ASSESSMENT:    1. Chronic diastolic CHF (congestive heart failure) ([place])   2. Coronary artery disease involving native coronary artery of native heart without angina pectoris   3. Essential hypertension   4. Pure hypercholesterolemia   5. Abnormal lung sounds      PLAN:  In order of problems listed above:  1. Chronic diastolic CHF - he appears euvolemic on exam today.  His weight is stable.  Continue BB.  2. ASCAD s/p remote PCI of the LCx 1994 and CABG 2000.  He has no  anginal symptoms.  He will continue ASA and BB. 3. HTN - BP controlled on current meds. Continue amlodipine/BB/ARB.  Check BMET. 4. Hyperlipidemia with LDL goal < 70. Now in CLEAR lipid  trial due to not being able to afford Praulent. 5. Abnormal lung sounds - I will get a chest xray.  He had a URI in November and has had a chronic cough since then.  He denies any fever or chills and Cxray at the time of his URI 06/2016 was normal.  I suspect this is residual from his URI as well as GERD.  I asked him to followup with his PCP if the cough does not resolve in the next few weeks.    Medication Adjustments/Labs and Tests Ordered: Current medicines are reviewed at length with the patient today.  Concerns regarding medicines are outlined above.  Medication changes, Labs and Tests ordered today are listed in the Patient Instructions below.  There are no Patient Instructions on file for this visit.   Signed, Fransico Him, MD  09/10/2016 9:22 AM    [facility] [address], [place], [place]  [address] Phone: [phone]; Fax: [phone]

## 2016-09-11 LAB — BASIC METABOLIC PANEL
BUN / CREAT RATIO: 18 (ref 10–24)
BUN: 17 mg/dL (ref 8–27)
CO2: 22 mmol/L (ref 18–29)
Calcium: 9.3 mg/dL (ref 8.6–10.2)
Chloride: 99 mmol/L (ref 96–106)
Creatinine, Ser: 0.94 mg/dL (ref 0.76–1.27)
GFR calc non Af Amer: 81 mL/min/{1.73_m2} (ref >59)
GFR, EST AFRICAN AMERICAN: 94 mL/min/{1.73_m2} (ref >59)
Glucose: 137 mg/dL — ABNORMAL HIGH (ref 65–99)
Potassium: 4.2 mmol/L (ref 3.5–5.2)
SODIUM: 139 mmol/L (ref 134–144)

## 2016-09-14 DIAGNOSIS — M542 Cervicalgia: Secondary | ICD-10-CM | POA: Diagnosis not present

## 2016-09-17 DIAGNOSIS — M542 Cervicalgia: Secondary | ICD-10-CM | POA: Diagnosis not present

## 2016-09-21 DIAGNOSIS — M542 Cervicalgia: Secondary | ICD-10-CM | POA: Diagnosis not present

## 2016-09-24 ENCOUNTER — Encounter: Payer: Self-pay | Admitting: Cardiology

## 2016-09-24 DIAGNOSIS — M542 Cervicalgia: Secondary | ICD-10-CM | POA: Diagnosis not present

## 2016-09-28 DIAGNOSIS — M542 Cervicalgia: Secondary | ICD-10-CM | POA: Diagnosis not present

## 2016-10-01 DIAGNOSIS — M542 Cervicalgia: Secondary | ICD-10-CM | POA: Diagnosis not present

## 2016-10-04 ENCOUNTER — Other Ambulatory Visit: Payer: Self-pay | Admitting: Cardiology

## 2016-10-05 ENCOUNTER — Other Ambulatory Visit: Payer: Self-pay | Admitting: Cardiology

## 2016-10-05 DIAGNOSIS — M542 Cervicalgia: Secondary | ICD-10-CM | POA: Diagnosis not present

## 2016-10-08 DIAGNOSIS — M542 Cervicalgia: Secondary | ICD-10-CM | POA: Diagnosis not present

## 2016-10-12 DIAGNOSIS — M542 Cervicalgia: Secondary | ICD-10-CM | POA: Diagnosis not present

## 2016-10-21 DIAGNOSIS — M1811 Unilateral primary osteoarthritis of first carpometacarpal joint, right hand: Secondary | ICD-10-CM | POA: Diagnosis not present

## 2016-10-21 DIAGNOSIS — M1812 Unilateral primary osteoarthritis of first carpometacarpal joint, left hand: Secondary | ICD-10-CM | POA: Diagnosis not present

## 2016-10-25 DIAGNOSIS — L570 Actinic keratosis: Secondary | ICD-10-CM | POA: Diagnosis not present

## 2016-10-25 DIAGNOSIS — L821 Other seborrheic keratosis: Secondary | ICD-10-CM | POA: Diagnosis not present

## 2016-10-25 DIAGNOSIS — Z85828 Personal history of other malignant neoplasm of skin: Secondary | ICD-10-CM | POA: Diagnosis not present

## 2016-11-29 DIAGNOSIS — M1712 Unilateral primary osteoarthritis, left knee: Secondary | ICD-10-CM | POA: Diagnosis not present

## 2016-12-10 ENCOUNTER — Encounter: Payer: Self-pay | Admitting: *Deleted

## 2016-12-10 NOTE — Progress Notes (Unsigned)
Hayden [name] came into the Research clinic on December 01, 2016 for a scheduled visit for the [employer] study.  He had had a steroid injection for his osteoarthritis of his right knee on November 29, 2016. Labs for the study were drawn on April 4 and he had some abnormal values in the hematology panel.  We received notification from the safety monitors of the study. Dr. Lia Foyer called Mr. Lautner and discussed the values and we will be arranging for a lab redraw via the study during the week of April 16-20, 2018.

## 2016-12-21 ENCOUNTER — Telehealth: Payer: Self-pay | Admitting: *Deleted

## 2016-12-21 NOTE — Telephone Encounter (Addendum)
Spoke with Hayden [name] to inform him of his CBC and BMET results we received back from [facility]. He is in the [employer] study with [place].   All results were within normal limits except Glucose which was 107.  All questions answered and [name] was appreciative of our repeat labs.

## 2017-01-20 DIAGNOSIS — J309 Allergic rhinitis, unspecified: Secondary | ICD-10-CM | POA: Diagnosis not present

## 2017-01-20 DIAGNOSIS — M542 Cervicalgia: Secondary | ICD-10-CM | POA: Diagnosis not present

## 2017-01-25 DIAGNOSIS — M5090 Cervical disc disorder, unspecified, unspecified cervical region: Secondary | ICD-10-CM | POA: Diagnosis not present

## 2017-01-25 DIAGNOSIS — M545 Low back pain: Secondary | ICD-10-CM | POA: Diagnosis not present

## 2017-01-26 DIAGNOSIS — M542 Cervicalgia: Secondary | ICD-10-CM | POA: Diagnosis not present

## 2017-01-28 DIAGNOSIS — M542 Cervicalgia: Secondary | ICD-10-CM | POA: Diagnosis not present

## 2017-02-01 DIAGNOSIS — M542 Cervicalgia: Secondary | ICD-10-CM | POA: Diagnosis not present

## 2017-02-03 DIAGNOSIS — M542 Cervicalgia: Secondary | ICD-10-CM | POA: Diagnosis not present

## 2017-02-08 DIAGNOSIS — M542 Cervicalgia: Secondary | ICD-10-CM | POA: Diagnosis not present

## 2017-02-10 DIAGNOSIS — M542 Cervicalgia: Secondary | ICD-10-CM | POA: Diagnosis not present

## 2017-02-11 DIAGNOSIS — M545 Low back pain: Secondary | ICD-10-CM | POA: Diagnosis not present

## 2017-02-15 DIAGNOSIS — M545 Low back pain: Secondary | ICD-10-CM | POA: Diagnosis not present

## 2017-02-17 DIAGNOSIS — M545 Low back pain: Secondary | ICD-10-CM | POA: Diagnosis not present

## 2017-02-17 DIAGNOSIS — M542 Cervicalgia: Secondary | ICD-10-CM | POA: Diagnosis not present

## 2017-03-22 DIAGNOSIS — M1812 Unilateral primary osteoarthritis of first carpometacarpal joint, left hand: Secondary | ICD-10-CM | POA: Diagnosis not present

## 2017-03-22 DIAGNOSIS — M5090 Cervical disc disorder, unspecified, unspecified cervical region: Secondary | ICD-10-CM | POA: Diagnosis not present

## 2017-04-15 ENCOUNTER — Other Ambulatory Visit: Payer: Self-pay | Admitting: Cardiology

## 2017-04-21 DIAGNOSIS — D1801 Hemangioma of skin and subcutaneous tissue: Secondary | ICD-10-CM | POA: Diagnosis not present

## 2017-04-21 DIAGNOSIS — Z85828 Personal history of other malignant neoplasm of skin: Secondary | ICD-10-CM | POA: Diagnosis not present

## 2017-04-21 DIAGNOSIS — L821 Other seborrheic keratosis: Secondary | ICD-10-CM | POA: Diagnosis not present

## 2017-04-21 DIAGNOSIS — L570 Actinic keratosis: Secondary | ICD-10-CM | POA: Diagnosis not present

## 2017-05-25 DIAGNOSIS — M1812 Unilateral primary osteoarthritis of first carpometacarpal joint, left hand: Secondary | ICD-10-CM | POA: Diagnosis not present

## 2017-08-02 ENCOUNTER — Telehealth: Payer: Self-pay | Admitting: *Deleted

## 2017-08-02 NOTE — Telephone Encounter (Signed)
Late entry:  Made T5M9 telephone call to subject on 06/28/17.  He reported no c/o, no aes or saes to capture.  Reminded him of next appointment on September 01, 2017 @0800 

## 2017-08-29 ENCOUNTER — Telehealth: Payer: Self-pay | Admitting: Cardiology

## 2017-08-29 NOTE — Telephone Encounter (Signed)
New message    Patient is requesting order for labs prior to appt 2/22. Please advise

## 2017-08-29 NOTE — Telephone Encounter (Signed)
Returned call to patient. He states he has an upcoming appt with Dr. Radford Pax on 2/22 and was asking if he needs to come in for fasting lipids prior to his appt. Patient is a patient of the CLEAR research program and has an upcoming appt with them for labs. Advised patient to have lab results faxed to office and call back if they are unable to fax labs and I will forward to Dr. Radford Pax to clarify if you need labs prior to office visit. Patient verbalized understanding and thanked me for the call.

## 2017-09-01 ENCOUNTER — Encounter: Payer: Self-pay | Admitting: *Deleted

## 2017-09-01 DIAGNOSIS — Z006 Encounter for examination for normal comparison and control in clinical research program: Secondary | ICD-10-CM

## 2017-09-01 NOTE — Progress Notes (Addendum)
Subject to research clinic for visit T6-M12 in the Clear research study. No c/o, aes or saes to report.  Subject 85% compliant with medication and new drug dispensed.  Next f/u phone call and clinic visit scheduled.  Subject re-consented to: [place] version 5.1, 28Aug2018 Local version [phone]

## 2017-09-15 DIAGNOSIS — M25511 Pain in right shoulder: Secondary | ICD-10-CM | POA: Diagnosis not present

## 2017-09-15 DIAGNOSIS — M25512 Pain in left shoulder: Secondary | ICD-10-CM | POA: Diagnosis not present

## 2017-10-05 DIAGNOSIS — M65311 Trigger thumb, right thumb: Secondary | ICD-10-CM | POA: Diagnosis not present

## 2017-10-05 DIAGNOSIS — M65312 Trigger thumb, left thumb: Secondary | ICD-10-CM | POA: Diagnosis not present

## 2017-10-05 DIAGNOSIS — M13841 Other specified arthritis, right hand: Secondary | ICD-10-CM | POA: Diagnosis not present

## 2017-10-05 DIAGNOSIS — M13842 Other specified arthritis, left hand: Secondary | ICD-10-CM | POA: Diagnosis not present

## 2017-10-13 ENCOUNTER — Encounter: Payer: Self-pay | Admitting: Cardiology

## 2017-10-15 ENCOUNTER — Other Ambulatory Visit: Payer: Self-pay | Admitting: Cardiology

## 2017-10-18 ENCOUNTER — Telehealth: Payer: Self-pay | Admitting: Cardiology

## 2017-10-18 DIAGNOSIS — E785 Hyperlipidemia, unspecified: Secondary | ICD-10-CM

## 2017-10-18 NOTE — Telephone Encounter (Signed)
New message   Pt wants to know if he needs to come in for blood work before his appt on 2/22. Please call

## 2017-10-18 NOTE — Telephone Encounter (Signed)
Pt is calling Dr. Radford Pax and RN to request fasting lipids and any other labs pertinent labs needed, to be done a day or two, prior to his OV on 10/21/17.  Pt states that its been about a year, and Dr Radford Pax usually likes them prior to his OV.  Informed the pt that Dr Radford Pax and RN are out of the office today.  Informed the pt that I will route this request to them for further review of requested orders, and someone from the office will follow-up with the pt thereafter. Pt verbalized understanding and agrees with this plan.

## 2017-10-19 NOTE — Telephone Encounter (Signed)
Patient requesting to get lipids drawn prior to OV on 02/22 with Dr. Radford Pax. Patient states he is in CLEARS research program and has been compliant with taking praluent. Patient's last lipid results are from 07/13/16. Patient scheduled for fasting lipids and liver function test on 10/20/17. Patient in agreement with plan and verbalized understanding.

## 2017-10-19 NOTE — Telephone Encounter (Signed)
Please find out with research if it is ok for [place] to check lipids.  If he is in a study sometimes we are not supposed to  Check the labs

## 2017-10-20 ENCOUNTER — Other Ambulatory Visit: Payer: PPO | Admitting: *Deleted

## 2017-10-20 NOTE — Telephone Encounter (Signed)
Explained to patient that labs will be cancelled due to being a participant in the CLEARS program and due to the program guidelines we are not allowed to draw labs for lipids and liver. Informed patient that lipids are being monitor and if any labs are abnormal they will contact Dr. Radford Pax. Patient verbalized understanding.

## 2017-10-20 NOTE — Telephone Encounter (Signed)
Spoke with patient. Patient already had labs drawn. Explained to patient that we have to follow up with research program before we can discuss or release any of the results. Patient verbalized understanding and thankful for the call.

## 2017-10-21 ENCOUNTER — Ambulatory Visit: Payer: PPO | Admitting: Cardiology

## 2017-10-21 ENCOUNTER — Encounter: Payer: Self-pay | Admitting: Cardiology

## 2017-10-21 VITALS — BP 132/66 | HR 83 | Ht 68.0 in | Wt 188.1 lb

## 2017-10-21 DIAGNOSIS — I10 Essential (primary) hypertension: Secondary | ICD-10-CM | POA: Diagnosis not present

## 2017-10-21 DIAGNOSIS — I2510 Atherosclerotic heart disease of native coronary artery without angina pectoris: Secondary | ICD-10-CM | POA: Diagnosis not present

## 2017-10-21 DIAGNOSIS — I5032 Chronic diastolic (congestive) heart failure: Secondary | ICD-10-CM

## 2017-10-21 DIAGNOSIS — E7800 Pure hypercholesterolemia, unspecified: Secondary | ICD-10-CM

## 2017-10-21 MED ORDER — BISOPROLOL FUMARATE 5 MG PO TABS: 2.5000 mg | ORAL_TABLET | Freq: Every day | ORAL | 3 refills | Status: DC

## 2017-10-21 NOTE — Patient Instructions (Addendum)
Medication Instructions:  Your physician recommends that you continue on your current medications as directed. Please refer to the Current Medication list given to you today.  If you need a refill on your cardiac medications, please contact your pharmacy first.  Labwork: Today for BMET  Testing/Procedures: None ordered   Follow-Up: Your physician wants you to follow-up in: 1 year with Dr. Radford Pax. You will receive a reminder letter in the mail two months in advance. If you don't receive a letter, please call our office to schedule the follow-up appointment.  Any Other Special Instructions Will Be Listed Below (If Applicable).   Thank you for choosing [facility], RN  [phone]  If you need a refill on your cardiac medications before your next appointment, please call your pharmacy.

## 2017-10-21 NOTE — Progress Notes (Signed)
Cardiology Office Note:    Date:  10/21/2017   ID:  Hayden [name], DOB 1946-03-25, MRN [id number]  PCP:  Donald Prose, MD  Cardiologist:  No primary care provider on file.    Referring MD: Donald Prose, MD   Chief Complaint  Patient presents with  . Coronary Artery Disease  . Hypertension  . Congestive Heart Failure  . Hyperlipidemia    History of Present Illness:    Hayden [name] is a 72 y.o. male with a hx of ASCAD s/p MI in 1994 with PTCA of the LCx and then CABG in 7026, chronic diastolic CHF, HTN and dyslipidemia.  He is here today for followup and is doing well.  He denies any chest pain or pressure, SOB, DOE, PND, orthopnea, LE edema, dizziness, palpitations or syncope. He is compliant with his meds and is tolerating meds with no SE.   Marland Kitchen  Past Medical History:  Diagnosis Date  . Anxiety disorder    No details  . Arthritis   . Bright's disease age 57  . Chronic diastolic CHF (congestive heart failure) ([place])   . Coronary artery disease    MI in 1994 with PTCA of left circ and then coronary bypass 2000  . Frequent urination   . H/O hiatal hernia   . Hyperlipidemia    statin intolerant  . Hypertension   . Hypogonadism male    No details available but receives replacement therapy  . Impaired hearing    both ears  . Kidney stones    100  in past plus stones  . Myocardial infarction ([place]) 10-31-1989  . Opiate addiction ([place])   . PONV (postoperative nausea and vomiting)    pt prefers epidural is possible    Past Surgical History:  Procedure Laterality Date  . ANGIOPLASTY  1991  . BILATERAL KNEE ARTHROSCOPY  yrs ago  . CARDIAC CATHETERIZATION  01-17-07  . CORONARY ANGIOPLASTY    . CORONARY ARTERY BYPASS GRAFT  10-2998   cabg x 3  . CYSTOSCOPY     10 to 12 times in past  . [place]   with mesh  . lithortripsy     x 3  . LUMBAR LAMINECTOMY  L 3 to L 4   Jul 01 1995  . NISSEN FUNDOPLICATION  3785  . TOTAL KNEE ARTHROPLASTY  10/27/2011   Procedure: TOTAL  KNEE ARTHROPLASTY;  Surgeon: Gearlean Alf, MD;  Location: [facility];  Service: Orthopedics;  Laterality: Right;    Current Medications: Current Meds  Medication Sig  . Alirocumab (PRALUENT) 75 MG/ML SOPN Inject 75 mg into the skin every 14 (fourteen) days.  . AMBULATORY NON FORMULARY MEDICATION Take 180 mg by mouth daily. Medication Name: bempedoic acid vs a placebo, CLEAR Research Study drug provided  . amLODipine (NORVASC) 5 MG tablet Take 1.5 tablets (7.5 mg total) by mouth daily.  Marland Kitchen aspirin EC 81 MG tablet Take 81 mg by mouth daily.  . bisoprolol (ZEBETA) 5 MG tablet Take 0.5 tablets (2.5 mg total) by mouth daily.  . Cholecalciferol (VITAMIN D) 2000 UNITS tablet Take 2,000 Units by mouth daily.  Marland Kitchen levocetirizine (XYZAL) 5 MG tablet Take 1 tablet by mouth daily.  . Multiple Vitamin (MULTIVITAMIN WITH MINERALS) TABS Take 1 tablet by mouth daily.  . nitroGLYCERIN (NITROSTAT) 0.4 MG SL tablet Place 0.4 mg under the tongue every 5 (five) minutes as needed. For chest pain.  Marland Kitchen omeprazole (PRILOSEC) 20 MG capsule Take 40 mg by  mouth daily.   Marland Kitchen telmisartan (MICARDIS) 80 MG tablet TAKE 1 TABLET BY MOUTH ONCE DAILY     Allergies:   Celecoxib; Penicillins; Sulfa antibiotics; and Sulfamethoxazole   Social History   Socioeconomic History  . Marital status: Married    Spouse name: None  . Number of children: None  . Years of education: None  . Highest education level: None  Social Needs  . Financial resource strain: None  . Food insecurity - worry: None  . Food insecurity - inability: None  . Transportation needs - medical: None  . Transportation needs - non-medical: None  Occupational History  . Occupation: Environmental manager: [employer]  Tobacco Use  . Smoking status: Never Smoker  . Smokeless tobacco: Never Used  Substance and Sexual Activity  . Alcohol use: No    Comment: quit ETOH in 2013  . Drug use: Yes    Comment: from [facility]-currently detoxing off opiates   . Sexual activity: None  Other Topics Concern  . None  Social History Narrative  . None     Family History: The patient's family history includes Cancer - Lung in his father and mother; Heart attack in his father and mother.  ROS:   Please see the history of present illness.    ROS  All other systems reviewed and negative.   EKGs/Labs/Other Studies Reviewed:    The following studies were reviewed today: none  EKG:  EKG is  ordered today.  The ekg ordered today demonstrates NSR with nonspecific T wave abnormality  Recent Labs: No results found for requested labs within last 8760 hours.   Recent Lipid Panel    Component Value Date/Time   CHOL 195 07/13/2016 0805   CHOL 121 06/28/2013 0809   TRIG 148 07/13/2016 0805   TRIG 106 06/28/2013 0809   HDL 27 (L) 07/13/2016 0805   HDL 37 (L) 06/28/2013 0809   CHOLHDL 7.2 (H) 07/13/2016 0805   VLDL 30 07/13/2016 0805   LDLCALC 138 (H) 07/13/2016 0805   LDLCALC 63 06/28/2013 0809    Physical Exam:    VS:  BP 132/66   Pulse 83   Ht 5\' 8"  (1.727 m)   Wt 188 lb 1.9 oz (85.3 kg)   BMI 28.60 kg/m     Wt Readings from Last 3 Encounters:  10/21/17 188 lb 1.9 oz (85.3 kg)  09/01/17 185 lb 12.8 oz (84.3 kg)  09/10/16 181 lb (82.1 kg)     GEN:  Well nourished, well developed in no acute distress HEENT: Normal NECK: No JVD; No carotid bruits LYMPHATICS: No lymphadenopathy CARDIAC: RRR, no murmurs, rubs, gallops RESPIRATORY:  Clear to auscultation without rales, wheezing or rhonchi  ABDOMEN: Soft, non-tender, non-distended MUSCULOSKELETAL:  No edema; No deformity  SKIN: Warm and dry NEUROLOGIC:  Alert and oriented x 3 PSYCHIATRIC:  Normal affect   ASSESSMENT:    1. Coronary artery disease involving native coronary artery of native heart without angina pectoris   2. Essential hypertension   3. Chronic diastolic CHF (congestive heart failure) ([place])   4. Pure hypercholesterolemia    PLAN:    In order of problems  listed above:  1.  ASCAD -status post remote MI in 1994 with PTCA of the left circumflex and subsequent CABG in 2000.  He denies any anginal chest pain.  He will continue on aspirin 81 mg daily, beta-blocker.  He is in the clear trial and is on Praulent.  Hypertension-blood pressure  is well controlled on exam today.  2.  HTN -his BP is well controlled on exam today.  He will continue on bisoprolol 2.5 mg daily, amlodipine 7.5 mg daily and telmisartan 80 mg daily.  I will check a Bmet today.  3.  Chronic diastolic CHF-he appears euvolemic on exam today.  His weight is stable.  He has not required any diuretics recently.  4.  Hyperlipidemia with LDL goal less than 70 - he will continue on praulent 75 mg every 14 days he is in the clear trial.  Therefore his lipids and LFTs are being followed in the study   Medication Adjustments/Labs and Tests Ordered: Current medicines are reviewed at length with the patient today.  Concerns regarding medicines are outlined above.  No orders of the defined types were placed in this encounter.  No orders of the defined types were placed in this encounter.   Signed, Fransico Him, MD  10/21/2017 1:49 PM    [place]

## 2017-10-22 LAB — BASIC METABOLIC PANEL
BUN/Creatinine Ratio: 24 (ref 10–24)
BUN: 25 mg/dL (ref 8–27)
CHLORIDE: 100 mmol/L (ref 96–106)
CO2: 20 mmol/L (ref 20–29)
Calcium: 9.8 mg/dL (ref 8.6–10.2)
Creatinine, Ser: 1.06 mg/dL (ref 0.76–1.27)
GFR calc Af Amer: 81 mL/min/{1.73_m2} (ref >59)
GFR calc non Af Amer: 70 mL/min/{1.73_m2} (ref >59)
GLUCOSE: 97 mg/dL (ref 65–99)
POTASSIUM: 4.7 mmol/L (ref 3.5–5.2)
Sodium: 139 mmol/L (ref 134–144)

## 2017-10-27 DIAGNOSIS — L565 Disseminated superficial actinic porokeratosis (DSAP): Secondary | ICD-10-CM | POA: Diagnosis not present

## 2017-10-27 DIAGNOSIS — Z85828 Personal history of other malignant neoplasm of skin: Secondary | ICD-10-CM | POA: Diagnosis not present

## 2017-10-27 DIAGNOSIS — C44629 Squamous cell carcinoma of skin of left upper limb, including shoulder: Secondary | ICD-10-CM | POA: Diagnosis not present

## 2017-10-27 DIAGNOSIS — D485 Neoplasm of uncertain behavior of skin: Secondary | ICD-10-CM | POA: Diagnosis not present

## 2017-10-27 DIAGNOSIS — L821 Other seborrheic keratosis: Secondary | ICD-10-CM | POA: Diagnosis not present

## 2017-10-27 DIAGNOSIS — C44622 Squamous cell carcinoma of skin of right upper limb, including shoulder: Secondary | ICD-10-CM | POA: Diagnosis not present

## 2017-10-27 DIAGNOSIS — L570 Actinic keratosis: Secondary | ICD-10-CM | POA: Diagnosis not present

## 2017-12-20 DIAGNOSIS — Z86010 Personal history of colonic polyps: Secondary | ICD-10-CM | POA: Diagnosis not present

## 2017-12-20 DIAGNOSIS — K219 Gastro-esophageal reflux disease without esophagitis: Secondary | ICD-10-CM | POA: Diagnosis not present

## 2018-01-15 ENCOUNTER — Other Ambulatory Visit: Payer: Self-pay | Admitting: Cardiology

## 2018-02-14 ENCOUNTER — Other Ambulatory Visit: Payer: Self-pay | Admitting: Cardiology

## 2018-03-06 VITALS — BP 131/71 | HR 64 | Wt 182.0 lb

## 2018-03-06 DIAGNOSIS — Z006 Encounter for examination for normal comparison and control in clinical research program: Secondary | ICD-10-CM

## 2018-03-06 NOTE — Progress Notes (Addendum)
Malen Gauze to research clinic for visit [phone] in the Clear study.  No complaints, adverse events, or serious adverse events to report. Patient 87% compliant with medication. New medication dispensed.  Next appointment scheduled.

## 2018-03-17 DIAGNOSIS — H906 Mixed conductive and sensorineural hearing loss, bilateral: Secondary | ICD-10-CM | POA: Diagnosis not present

## 2018-03-27 DIAGNOSIS — H906 Mixed conductive and sensorineural hearing loss, bilateral: Secondary | ICD-10-CM | POA: Diagnosis not present

## 2018-05-03 DIAGNOSIS — M13842 Other specified arthritis, left hand: Secondary | ICD-10-CM | POA: Diagnosis not present

## 2018-05-03 DIAGNOSIS — M13841 Other specified arthritis, right hand: Secondary | ICD-10-CM | POA: Diagnosis not present

## 2018-05-09 DIAGNOSIS — M542 Cervicalgia: Secondary | ICD-10-CM | POA: Diagnosis not present

## 2018-05-09 DIAGNOSIS — M1712 Unilateral primary osteoarthritis, left knee: Secondary | ICD-10-CM | POA: Diagnosis not present

## 2018-05-09 DIAGNOSIS — M25562 Pain in left knee: Secondary | ICD-10-CM | POA: Diagnosis not present

## 2018-05-11 DIAGNOSIS — M542 Cervicalgia: Secondary | ICD-10-CM | POA: Diagnosis not present

## 2018-05-11 DIAGNOSIS — M25511 Pain in right shoulder: Secondary | ICD-10-CM | POA: Diagnosis not present

## 2018-05-11 DIAGNOSIS — M25512 Pain in left shoulder: Secondary | ICD-10-CM | POA: Diagnosis not present

## 2018-05-18 DIAGNOSIS — M25512 Pain in left shoulder: Secondary | ICD-10-CM | POA: Diagnosis not present

## 2018-05-18 DIAGNOSIS — M542 Cervicalgia: Secondary | ICD-10-CM | POA: Diagnosis not present

## 2018-05-18 DIAGNOSIS — L821 Other seborrheic keratosis: Secondary | ICD-10-CM | POA: Diagnosis not present

## 2018-05-18 DIAGNOSIS — L570 Actinic keratosis: Secondary | ICD-10-CM | POA: Diagnosis not present

## 2018-05-18 DIAGNOSIS — D485 Neoplasm of uncertain behavior of skin: Secondary | ICD-10-CM | POA: Diagnosis not present

## 2018-05-18 DIAGNOSIS — M25511 Pain in right shoulder: Secondary | ICD-10-CM | POA: Diagnosis not present

## 2018-05-18 DIAGNOSIS — D1801 Hemangioma of skin and subcutaneous tissue: Secondary | ICD-10-CM | POA: Diagnosis not present

## 2018-05-18 DIAGNOSIS — Z85828 Personal history of other malignant neoplasm of skin: Secondary | ICD-10-CM | POA: Diagnosis not present

## 2018-05-23 DIAGNOSIS — M542 Cervicalgia: Secondary | ICD-10-CM | POA: Diagnosis not present

## 2018-05-23 DIAGNOSIS — M25511 Pain in right shoulder: Secondary | ICD-10-CM | POA: Diagnosis not present

## 2018-05-23 DIAGNOSIS — M25512 Pain in left shoulder: Secondary | ICD-10-CM | POA: Diagnosis not present

## 2018-06-01 DIAGNOSIS — M25511 Pain in right shoulder: Secondary | ICD-10-CM | POA: Diagnosis not present

## 2018-06-01 DIAGNOSIS — M25512 Pain in left shoulder: Secondary | ICD-10-CM | POA: Diagnosis not present

## 2018-07-04 ENCOUNTER — Telehealth: Payer: Self-pay

## 2018-07-04 NOTE — Telephone Encounter (Signed)
   Primary Cardiologist:Traci Turner, MD  Chart reviewed as part of pre-operative protocol coverage. Because of Hayden [name] past medical history and time since last visit, he/she will require a follow-up visit in order to better assess preoperative cardiovascular risk.  Pre-op covering staff: - Please schedule appointment and call patient to inform them. - Please contact requesting surgeon's office via preferred method (i.e, phone, fax) to inform them of need for appointment prior to surgery.  If applicable, this message will also be routed to pharmacy pool and/or primary cardiologist for input on holding anticoagulant/antiplatelet agent as requested below so that this information is available at time of patient's appointment.   Daune Perch, NP  07/04/2018, 4:30 PM

## 2018-07-04 NOTE — Telephone Encounter (Signed)
Pt is scheduled to see Ermalinda Barrios PA on 07/12/18, Clearance will be addressed at visit.

## 2018-07-04 NOTE — Telephone Encounter (Signed)
OK to hold ASA for procedure 

## 2018-07-04 NOTE — Telephone Encounter (Signed)
   [facility] Pre-operative Risk Assessment    Request for surgical clearance:  1. What type of surgery is being performed? Left thumb CMC arthnoplasty   2. When is this surgery scheduled? 08/15/18   3. What type of clearance is required (medical clearance vs. Pharmacy clearance to hold med vs. Both)? Both  4. Are there any medications that need to be held prior to surgery and how long? Aspirin 5 to 7 days   5. Practice name and name of physician performing surgery? [facility], Dr. Amedeo Plenty  6. What is your office phone number [phone] Attn: Sherri    7.   What is your office fax number  [phone]  8.   Anesthesia type (None, local, MAC, general) ? Block with IV sedation   Hayden [name] 07/04/2018, 1:04 PM  _________________________________________________________________   (provider comments below)

## 2018-07-04 NOTE — Telephone Encounter (Signed)
Dr. Radford Pax, can aspirin be held for surgery? Remote hx of CABG in 2000  Please route response back to P CV DIV PREOP  Thanks

## 2018-07-12 ENCOUNTER — Encounter: Payer: Self-pay | Admitting: Physician Assistant

## 2018-07-12 ENCOUNTER — Ambulatory Visit (INDEPENDENT_AMBULATORY_CARE_PROVIDER_SITE_OTHER): Payer: PPO | Admitting: Physician Assistant

## 2018-07-12 VITALS — BP 132/68 | HR 58 | Ht 68.0 in | Wt 183.0 lb

## 2018-07-12 DIAGNOSIS — I2510 Atherosclerotic heart disease of native coronary artery without angina pectoris: Secondary | ICD-10-CM | POA: Diagnosis not present

## 2018-07-12 DIAGNOSIS — I10 Essential (primary) hypertension: Secondary | ICD-10-CM | POA: Diagnosis not present

## 2018-07-12 DIAGNOSIS — E7800 Pure hypercholesterolemia, unspecified: Secondary | ICD-10-CM

## 2018-07-12 DIAGNOSIS — Z01818 Encounter for other preprocedural examination: Secondary | ICD-10-CM

## 2018-07-12 NOTE — Progress Notes (Signed)
Cardiology Office Note    Date:  07/12/2018   ID:  Hayden [name], DOB 05-19-1946, MRN [id number]  PCP:  Donald Prose, MD  Cardiologist: Fransico Him, MD EPS: None  Chief Complaint  Patient presents with  . Pre-op Exam    History of Present Illness:  Hayden [name] is a 72 y.o. male with history of CAD status post MI in 1994 treated with PTCA of the circumflex followed by CABG in 2000.  Also has chronic diastolic CHF, hypertension, hyperlipidemia.    Last saw Dr. Radford Pax 10/21/2017 and was doing well..  Patient is in the clear study on Praluent and lipids and LFTs were followed in the study.  Last 2D echo 2014 LVEF 50 to 55% cannot exclude hypokinesis of the basal anterior septum, mild MR, left atrium moderately dilated.  Patient is here for preoperative clearance before undergoing left thumb CMC arthroplasty surgery by Dr. Amedeo Plenty.August 15, 2018.  Patient goes to gym 3 times/week.  Walks on treadmill 15 min and does other machines using whole body. Walks 10,000 steps/day. Walks and carries bag when he golfs. Denies chest pain, dyspnea, dyspnea on exertion, dizziness or presyncope.  Still works part-time.   Past Medical History:  Diagnosis Date  . Anxiety disorder    No details  . Arthritis   . Bright's disease age 23  . Chronic diastolic CHF (congestive heart failure) ([place])   . Coronary artery disease    MI in 1994 with PTCA of left circ and then coronary bypass 2000  . Frequent urination   . H/O hiatal hernia   . Hyperlipidemia    statin intolerant  . Hypertension   . Hypogonadism male    No details available but receives replacement therapy  . Impaired hearing    both ears  . Kidney stones    100  in past plus stones  . Myocardial infarction ([place]) 10-31-1989  . Opiate addiction ([place])   . PONV (postoperative nausea and vomiting)    pt prefers epidural is possible    Past Surgical History:  Procedure Laterality Date  . ANGIOPLASTY  1991  . BILATERAL KNEE  ARTHROSCOPY  yrs ago  . CARDIAC CATHETERIZATION  01-17-07  . CORONARY ANGIOPLASTY    . CORONARY ARTERY BYPASS GRAFT  [id number]   cabg x 3  . CYSTOSCOPY     10 to 12 times in past  . [place]   with mesh  . lithortripsy     x 3  . LUMBAR LAMINECTOMY  L 3 to L 4   Jul 01 1995  . NISSEN FUNDOPLICATION  2703  . TOTAL KNEE ARTHROPLASTY  10/27/2011   Procedure: TOTAL KNEE ARTHROPLASTY;  Surgeon: Gearlean Alf, MD;  Location: [facility];  Service: Orthopedics;  Laterality: Right;    Current Medications: Current Meds  Medication Sig  . AMBULATORY NON FORMULARY MEDICATION Take 180 mg by mouth daily. Medication Name: bempedoic acid vs a placebo, CLEAR Research Study drug provided  . amLODipine (NORVASC) 5 MG tablet TAKE 1 AND 1/2 TABLET BY MOUTH DAILY  . aspirin EC 81 MG tablet Take 81 mg by mouth daily.  . bisoprolol (ZEBETA) 5 MG tablet Take 0.5 tablets (2.5 mg total) by mouth daily.  . Cholecalciferol (VITAMIN D) 2000 UNITS tablet Take 2,000 Units by mouth daily.  . Multiple Vitamin (MULTIVITAMIN WITH MINERALS) TABS Take 1 tablet by mouth daily.  . nitroGLYCERIN (NITROSTAT) 0.4 MG SL tablet Place 0.4 mg under the tongue  every 5 (five) minutes as needed. For chest pain.  Marland Kitchen omeprazole (PRILOSEC) 20 MG capsule Take 40 mg by mouth daily.   Marland Kitchen telmisartan (MICARDIS) 80 MG tablet TAKE 1 TABLET BY MOUTH EVERY DAY     Allergies:   Celecoxib; Penicillins; Sulfa antibiotics; and Sulfamethoxazole   Social History   Socioeconomic History  . Marital status: Married    Spouse name: Not on file  . Number of children: Not on file  . Years of education: Not on file  . Highest education level: Not on file  Occupational History  . Occupation: Environmental manager: [employer]  Social Needs  . Financial resource strain: Not on file  . Food insecurity:    Worry: Not on file    Inability: Not on file  . Transportation needs:    Medical: Not on file    Non-medical: Not on file    Tobacco Use  . Smoking status: Never Smoker  . Smokeless tobacco: Never Used  Substance and Sexual Activity  . Alcohol use: No    Comment: quit ETOH in 2013  . Drug use: Yes    Comment: from [facility]-currently detoxing off opiates  . Sexual activity: Not on file  Lifestyle  . Physical activity:    Days per week: Not on file    Minutes per session: Not on file  . Stress: Not on file  Relationships  . Social connections:    Talks on phone: Not on file    Gets together: Not on file    Attends religious service: Not on file    Active member of club or organization: Not on file    Attends meetings of clubs or organizations: Not on file    Relationship status: Not on file  Other Topics Concern  . Not on file  Social History Narrative  . Not on file     Family History:  The patient's family history includes Cancer - Lung in his father and mother; Heart attack in his father and mother.   ROS:   Please see the history of present illness.    Review of Systems  Constitution: Negative.  HENT: Negative.   Cardiovascular: Negative.   Respiratory: Negative.   Endocrine: Negative.   Hematologic/Lymphatic: Negative.   Musculoskeletal: Negative.   Gastrointestinal: Negative.   Genitourinary: Negative.   Neurological: Negative.    All other systems reviewed and are negative.   PHYSICAL EXAM:   VS:  BP 132/68   Pulse (!) 58   Ht 5\' 8"  (1.727 m)   Wt 183 lb (83 kg)   BMI 27.83 kg/m   Physical Exam  GEN: Well nourished, well developed, in no acute distress  Neck: no JVD, carotid bruits, or masses Cardiac:RRR; no murmurs, rubs, or gallops  Respiratory:  clear to auscultation bilaterally, normal work of breathing GI: soft, nontender, nondistended, + BS Ext: without cyanosis, clubbing, or edema, Good distal pulses bilaterally Neuro:  Alert and Oriented x 3 Psych: euthymic mood, full affect  Wt Readings from Last 3 Encounters:  07/12/18 183 lb (83 kg)  03/06/18 182 lb  (82.6 kg)  10/21/17 188 lb 1.9 oz (85.3 kg)      Studies/Labs Reviewed:   EKG:  EKG is  ordered today.  The ekg ordered today demonstrates signs bradycardia at 58 bpm nonspecific ST-T wave changes, no acute change from prior EKGs  Recent Labs: 10/21/2017: BUN 25; Creatinine, Ser 1.06; Potassium 4.7; Sodium 139   Lipid  Panel    Component Value Date/Time   CHOL 195 07/13/2016 0805   CHOL 121 06/28/2013 0809   TRIG 148 07/13/2016 0805   TRIG 106 06/28/2013 0809   HDL 27 (L) 07/13/2016 0805   HDL 37 (L) 06/28/2013 0809   CHOLHDL 7.2 (H) 07/13/2016 0805   VLDL 30 07/13/2016 0805   LDLCALC 138 (H) 07/13/2016 0805   LDLCALC 63 06/28/2013 0809    Additional studies/ records that were reviewed today include:  2D echo 2014Study Conclusions  - Left ventricle: The cavity size was normal. Systolic   function was normal. The estimated ejection fraction was   in the range of 50% to 55%. Cannot exclude hypokinesis of   the basalanteroseptal myocardium. Left ventricular   diastolic function parameters were normal. - Mitral valve: Mild regurgitation. - Left atrium: The atrium was moderately dilated. - Atrial septum: No defect or patent foramen ovale was   identified. Transthoracic echocardiography.  M-mode, complete 2D, spectral Doppler, and color Doppler.  Height:  Height: 172.7cm. Height: 68in.  Weight:  Weight: 84.1kg. Weight: 185lb.  Body mass index:  BMI: 28.2kg/m^2.  Body surface area:    BSA: 2.46m^2.  Blood pressure:     113/69.  Patient status:  Inpatient.  Location:  Bedside.  NST 2014IMPRESSION: Predominately fixed defect in the septum compatible with old infarct.  There is a small amount of reversibility within this septal segment compatible with ischemia.   Ejection fraction 47%.        ASSESSMENT:    1. Preoperative clearance   2. Coronary artery disease involving native coronary artery of native heart without angina pectoris   3. Essential hypertension   4.  Chronic diastolic CHF (congestive heart failure) ([place])   5. Pure hypercholesterolemia      PLAN:  In order of problems listed above:  Preoperative clearance before undergoing thumb surgery by Dr. Amedeo Plenty.  Patient had CABG in 2000.  Last stress test 2014.  Very active with METs of 8.97.  Low risk surgery.  According to the revised cardiac index is perioperative risk is low at 0.9.  Does not require any further cardiac testing before proceeding with surgery. According to the Revised Cardiac Risk Index (RCRI), his Perioperative Risk of Major Cardiac Event is (%): 0.9  His Functional Capacity in METs is: 8.97 according to the Duke Activity Status Index (DASI).  CAD status post prior PTCA of the circumflex followed by CABG in 2000 last stress test 2014 without angina.  Very active.  Essential hypertension blood pressure well controlled on amlodipine and Zebeta and Micardis  Chronic diastolic CHF has not had heart failure in years and does not require diuretics.  Hyperlipidemia in clinical trial called clear trial on Praluent    Medication Adjustments/Labs and Tests Ordered: Current medicines are reviewed at length with the patient today.  Concerns regarding medicines are outlined above.  Medication changes, Labs and Tests ordered today are listed in the Patient Instructions below. Patient Instructions  Medication Instructions:  Your physician recommends that you continue on your current medications as directed. Please refer to the Current Medication list given to you today.  If you need a refill on your cardiac medications before your next appointment, please call your pharmacy.   Lab work: None ordered  If you have labs (blood work) drawn today and your tests are completely normal, you will receive your results only by: Marland Kitchen MyChart Message (if you have MyChart) OR . A paper copy in the mail If  you have any lab test that is abnormal or we need to change your treatment, we will call you  to review the results.  Testing/Procedures: None ordered  Follow-Up: Your physician wants you to follow-up in: [place] will receive a reminder letter in the mail two months in advance. If you don't receive a letter, please call our office to schedule the follow-up appointment.  Any Other Special Instructions Will Be Listed Below (If Applicable).       Signed, Ermalinda Barrios, PA-C  07/12/2018 2:11 PM    [facility] [address], [place], [place]  [address] Phone: [phone]; Fax: [phone]

## 2018-07-12 NOTE — Patient Instructions (Addendum)
Medication Instructions:  Your physician recommends that you continue on your current medications as directed. Please refer to the Current Medication list given to you today.  If you need a refill on your cardiac medications before your next appointment, please call your pharmacy.   Lab work: None ordered  If you have labs (blood work) drawn today and your tests are completely normal, you will receive your results only by: Marland Kitchen MyChart Message (if you have MyChart) OR . A paper copy in the mail If you have any lab test that is abnormal or we need to change your treatment, we will call you to review the results.  Testing/Procedures: None ordered  Follow-Up: Your physician wants you to follow-up in: [place] will receive a reminder letter in the mail two months in advance. If you don't receive a letter, please call our office to schedule the follow-up appointment.  Any Other Special Instructions Will Be Listed Below (If Applicable).

## 2018-08-01 ENCOUNTER — Telehealth: Payer: Self-pay | Admitting: *Deleted

## 2018-08-01 NOTE — Telephone Encounter (Signed)
Clear trial follow up phone call, Visit T9-M21

## 2018-08-15 DIAGNOSIS — M13132 Monoarthritis, not elsewhere classified, left wrist: Secondary | ICD-10-CM | POA: Diagnosis not present

## 2018-08-15 DIAGNOSIS — M180 Bilateral primary osteoarthritis of first carpometacarpal joints: Secondary | ICD-10-CM | POA: Diagnosis not present

## 2018-08-15 DIAGNOSIS — G8918 Other acute postprocedural pain: Secondary | ICD-10-CM | POA: Diagnosis not present

## 2018-08-15 DIAGNOSIS — G5602 Carpal tunnel syndrome, left upper limb: Secondary | ICD-10-CM | POA: Diagnosis not present

## 2018-08-15 DIAGNOSIS — M1812 Unilateral primary osteoarthritis of first carpometacarpal joint, left hand: Secondary | ICD-10-CM | POA: Diagnosis not present

## 2018-08-31 DIAGNOSIS — M13842 Other specified arthritis, left hand: Secondary | ICD-10-CM | POA: Diagnosis not present

## 2018-08-31 DIAGNOSIS — Z5189 Encounter for other specified aftercare: Secondary | ICD-10-CM | POA: Diagnosis not present

## 2018-08-31 DIAGNOSIS — G5602 Carpal tunnel syndrome, left upper limb: Secondary | ICD-10-CM | POA: Diagnosis not present

## 2018-09-04 ENCOUNTER — Encounter: Payer: PPO | Admitting: *Deleted

## 2018-09-04 VITALS — BP 128/71 | HR 84 | Wt 178.0 lb

## 2018-09-04 DIAGNOSIS — Z006 Encounter for examination for normal comparison and control in clinical research program: Secondary | ICD-10-CM

## 2018-09-04 NOTE — Research (Signed)
Subject to research clinic for T10-M24 in the Clear research trial.  No cos, aes or saes to report.  Con procedure reported to sponsor.  94% compliant with meds and new meds dispensed.  Next phone follow up and clinic visit scheduled.

## 2018-09-13 DIAGNOSIS — Z5189 Encounter for other specified aftercare: Secondary | ICD-10-CM | POA: Diagnosis not present

## 2018-09-13 DIAGNOSIS — M13842 Other specified arthritis, left hand: Secondary | ICD-10-CM | POA: Diagnosis not present

## 2018-09-27 DIAGNOSIS — Z5189 Encounter for other specified aftercare: Secondary | ICD-10-CM | POA: Diagnosis not present

## 2018-09-27 DIAGNOSIS — G5602 Carpal tunnel syndrome, left upper limb: Secondary | ICD-10-CM | POA: Diagnosis not present

## 2018-09-27 DIAGNOSIS — M13842 Other specified arthritis, left hand: Secondary | ICD-10-CM | POA: Diagnosis not present

## 2018-09-27 DIAGNOSIS — M25642 Stiffness of left hand, not elsewhere classified: Secondary | ICD-10-CM | POA: Diagnosis not present

## 2018-10-09 ENCOUNTER — Other Ambulatory Visit: Payer: Self-pay | Admitting: Cardiology

## 2018-10-11 DIAGNOSIS — M25642 Stiffness of left hand, not elsewhere classified: Secondary | ICD-10-CM | POA: Diagnosis not present

## 2018-10-18 DIAGNOSIS — M25642 Stiffness of left hand, not elsewhere classified: Secondary | ICD-10-CM | POA: Diagnosis not present

## 2018-10-24 DIAGNOSIS — L309 Dermatitis, unspecified: Secondary | ICD-10-CM | POA: Diagnosis not present

## 2018-10-26 DIAGNOSIS — M13842 Other specified arthritis, left hand: Secondary | ICD-10-CM | POA: Diagnosis not present

## 2018-10-26 DIAGNOSIS — M25642 Stiffness of left hand, not elsewhere classified: Secondary | ICD-10-CM | POA: Diagnosis not present

## 2018-10-26 DIAGNOSIS — Z5189 Encounter for other specified aftercare: Secondary | ICD-10-CM | POA: Diagnosis not present

## 2018-11-02 DIAGNOSIS — M25632 Stiffness of left wrist, not elsewhere classified: Secondary | ICD-10-CM | POA: Diagnosis not present

## 2018-11-08 DIAGNOSIS — M25632 Stiffness of left wrist, not elsewhere classified: Secondary | ICD-10-CM | POA: Diagnosis not present

## 2018-11-10 ENCOUNTER — Other Ambulatory Visit: Payer: Self-pay | Admitting: Cardiology

## 2018-11-20 DIAGNOSIS — L570 Actinic keratosis: Secondary | ICD-10-CM | POA: Diagnosis not present

## 2018-11-20 DIAGNOSIS — L503 Dermatographic urticaria: Secondary | ICD-10-CM | POA: Diagnosis not present

## 2018-11-20 DIAGNOSIS — L821 Other seborrheic keratosis: Secondary | ICD-10-CM | POA: Diagnosis not present

## 2018-11-20 DIAGNOSIS — Z85828 Personal history of other malignant neoplasm of skin: Secondary | ICD-10-CM | POA: Diagnosis not present

## 2018-11-22 DIAGNOSIS — M25642 Stiffness of left hand, not elsewhere classified: Secondary | ICD-10-CM | POA: Diagnosis not present

## 2018-11-22 DIAGNOSIS — M1712 Unilateral primary osteoarthritis, left knee: Secondary | ICD-10-CM | POA: Diagnosis not present

## 2018-11-23 DIAGNOSIS — G5602 Carpal tunnel syndrome, left upper limb: Secondary | ICD-10-CM | POA: Diagnosis not present

## 2018-11-23 DIAGNOSIS — M13841 Other specified arthritis, right hand: Secondary | ICD-10-CM | POA: Diagnosis not present

## 2018-11-23 DIAGNOSIS — M13842 Other specified arthritis, left hand: Secondary | ICD-10-CM | POA: Diagnosis not present

## 2018-12-15 ENCOUNTER — Telehealth: Payer: Self-pay | Admitting: *Deleted

## 2018-12-15 NOTE — Telephone Encounter (Signed)
Spoke with patient for visit T11-M27 in the Clear research study.  No cos, aes or saes to report to sponsor.  Patient doing well and staying safe. Very appreciative of call.  Next appointment confirmed and conversation regarding methods to dispense drug if not able to come to clinic due to Covid restrictions.

## 2019-01-03 DIAGNOSIS — M79642 Pain in left hand: Secondary | ICD-10-CM | POA: Diagnosis not present

## 2019-01-31 DIAGNOSIS — M79642 Pain in left hand: Secondary | ICD-10-CM | POA: Diagnosis not present

## 2019-01-31 DIAGNOSIS — G5602 Carpal tunnel syndrome, left upper limb: Secondary | ICD-10-CM | POA: Diagnosis not present

## 2019-01-31 DIAGNOSIS — M13841 Other specified arthritis, right hand: Secondary | ICD-10-CM | POA: Diagnosis not present

## 2019-01-31 DIAGNOSIS — M1812 Unilateral primary osteoarthritis of first carpometacarpal joint, left hand: Secondary | ICD-10-CM | POA: Diagnosis not present

## 2019-02-13 DIAGNOSIS — M79645 Pain in left finger(s): Secondary | ICD-10-CM | POA: Diagnosis not present

## 2019-02-13 DIAGNOSIS — M75102 Unspecified rotator cuff tear or rupture of left shoulder, not specified as traumatic: Secondary | ICD-10-CM | POA: Diagnosis not present

## 2019-02-13 DIAGNOSIS — M1712 Unilateral primary osteoarthritis, left knee: Secondary | ICD-10-CM | POA: Diagnosis not present

## 2019-02-13 DIAGNOSIS — M545 Low back pain: Secondary | ICD-10-CM | POA: Diagnosis not present

## 2019-02-13 DIAGNOSIS — M25512 Pain in left shoulder: Secondary | ICD-10-CM | POA: Diagnosis not present

## 2019-02-23 DIAGNOSIS — M545 Low back pain: Secondary | ICD-10-CM | POA: Diagnosis not present

## 2019-03-01 DIAGNOSIS — M545 Low back pain: Secondary | ICD-10-CM | POA: Diagnosis not present

## 2019-03-05 DIAGNOSIS — M545 Low back pain: Secondary | ICD-10-CM | POA: Diagnosis not present

## 2019-03-07 ENCOUNTER — Encounter: Payer: Self-pay | Admitting: *Deleted

## 2019-03-07 DIAGNOSIS — Z006 Encounter for examination for normal comparison and control in clinical research program: Secondary | ICD-10-CM

## 2019-03-07 NOTE — Research (Signed)
Called patient for verbal consent to receive Clear study drug via [employer].  Consent obtained and physical address verified.  Conducted visit T12-M30 by phone. No cos, aes or saes to report.No change in medications.  Patient instructed to not take any of the new study drug delivered until follow up phone call to verify drug received acceptable for use. Pt verbalized understanding.

## 2019-03-22 DIAGNOSIS — M545 Low back pain: Secondary | ICD-10-CM | POA: Diagnosis not present

## 2019-03-27 DIAGNOSIS — M545 Low back pain: Secondary | ICD-10-CM | POA: Diagnosis not present

## 2019-04-03 ENCOUNTER — Other Ambulatory Visit: Payer: Self-pay | Admitting: Cardiology

## 2019-04-16 ENCOUNTER — Telehealth: Payer: Self-pay | Admitting: Cardiology

## 2019-04-16 DIAGNOSIS — R05 Cough: Secondary | ICD-10-CM | POA: Diagnosis not present

## 2019-04-16 DIAGNOSIS — J309 Allergic rhinitis, unspecified: Secondary | ICD-10-CM | POA: Diagnosis not present

## 2019-04-16 DIAGNOSIS — I10 Essential (primary) hypertension: Secondary | ICD-10-CM | POA: Diagnosis not present

## 2019-04-16 MED ORDER — BISOPROLOL FUMARATE 5 MG PO TABS: 5.0000 mg | ORAL_TABLET | Freq: Every day | ORAL | 3 refills | Status: DC

## 2019-04-16 MED ORDER — AMLODIPINE BESYLATE 10 MG PO TABS: 10.0000 mg | ORAL_TABLET | Freq: Every day | ORAL | 3 refills | Status: DC

## 2019-04-16 NOTE — Telephone Encounter (Signed)
I spoke to the patient with Dr Theodosia Blender recommendations.  He verbalized understanding.

## 2019-04-16 NOTE — Telephone Encounter (Signed)
I spoke to the patient who said that he has suffered from a chronic cough.  He has been on Telmisartan 80 mg Daily for long time, but the cough and "losing voice" has gotten worse.  As soon as he begins to speak, he develops a tickle in his throat and starts coughing.  His PCP has recommended that he informs Dr Radford Pax and gets her opinion/recommendations regarding Telmisartan.

## 2019-04-16 NOTE — Telephone Encounter (Signed)
New Message     Pt c/o medication issue:  1. Name of Medication: Telmisartan  2. How are you currently taking this medication (dosage and times per day)? 80mg  1 tablet daily   3. Are you having a reaction (difficulty breathing--STAT)? No   4. What is your medication issue? Patient states he had a cough for several months now and PCP thinks it's due to the medication he's taking.  Patient would like to speak to nurse about the issue.

## 2019-04-16 NOTE — Telephone Encounter (Signed)
Stop Telmisartan and increase amlodipine to 10mg  daily and Bisoprolol to 5mg  daily.  Check BP and HR daily for a week and call with results

## 2019-04-20 DIAGNOSIS — H43813 Vitreous degeneration, bilateral: Secondary | ICD-10-CM | POA: Diagnosis not present

## 2019-04-20 DIAGNOSIS — H5310 Unspecified subjective visual disturbances: Secondary | ICD-10-CM | POA: Diagnosis not present

## 2019-04-26 ENCOUNTER — Telehealth: Payer: Self-pay

## 2019-04-26 NOTE — Telephone Encounter (Signed)
Continue meds as he is currently taking

## 2019-04-26 NOTE — Telephone Encounter (Signed)
On 8/17, the patient was told to stop Telmisartan, increase Amlodipine to 10 mg Daily and increase Bisoprolol to 5 mg Daily.  He came to the office to deliver recorded BP/HR and I was handed the recordings from a CMA;    His BP ranged from 113/82 to 135/72 and HR from 60-69.

## 2019-04-26 NOTE — Telephone Encounter (Signed)
Pt.notified

## 2019-04-27 ENCOUNTER — Ambulatory Visit (HOSPITAL_COMMUNITY)
Admission: RE | Admit: 2019-04-27 | Discharge: 2019-04-27 | Disposition: A | Discharge status: Home / Self Care | Payer: PPO | Source: Ambulatory Visit | Attending: Cardiovascular Disease | Admitting: Cardiovascular Disease

## 2019-04-27 ENCOUNTER — Other Ambulatory Visit (HOSPITAL_COMMUNITY): Payer: Self-pay | Admitting: Family Medicine

## 2019-04-27 ENCOUNTER — Other Ambulatory Visit: Payer: Self-pay

## 2019-04-27 DIAGNOSIS — H539 Unspecified visual disturbance: Secondary | ICD-10-CM | POA: Diagnosis not present

## 2019-05-18 DIAGNOSIS — I5032 Chronic diastolic (congestive) heart failure: Secondary | ICD-10-CM | POA: Diagnosis not present

## 2019-05-18 DIAGNOSIS — I10 Essential (primary) hypertension: Secondary | ICD-10-CM | POA: Diagnosis not present

## 2019-05-18 DIAGNOSIS — Z1159 Encounter for screening for other viral diseases: Secondary | ICD-10-CM | POA: Diagnosis not present

## 2019-05-18 DIAGNOSIS — E785 Hyperlipidemia, unspecified: Secondary | ICD-10-CM | POA: Diagnosis not present

## 2019-05-18 DIAGNOSIS — Z0000 Encounter for general adult medical examination without abnormal findings: Secondary | ICD-10-CM | POA: Diagnosis not present

## 2019-05-18 DIAGNOSIS — I2510 Atherosclerotic heart disease of native coronary artery without angina pectoris: Secondary | ICD-10-CM | POA: Diagnosis not present

## 2019-05-18 DIAGNOSIS — Z125 Encounter for screening for malignant neoplasm of prostate: Secondary | ICD-10-CM | POA: Diagnosis not present

## 2019-05-18 DIAGNOSIS — Z1389 Encounter for screening for other disorder: Secondary | ICD-10-CM | POA: Diagnosis not present

## 2019-05-21 DIAGNOSIS — I252 Old myocardial infarction: Secondary | ICD-10-CM | POA: Diagnosis not present

## 2019-05-21 DIAGNOSIS — E039 Hypothyroidism, unspecified: Secondary | ICD-10-CM | POA: Diagnosis not present

## 2019-05-21 DIAGNOSIS — I10 Essential (primary) hypertension: Secondary | ICD-10-CM | POA: Diagnosis not present

## 2019-05-21 DIAGNOSIS — E785 Hyperlipidemia, unspecified: Secondary | ICD-10-CM | POA: Diagnosis not present

## 2019-05-21 DIAGNOSIS — F329 Major depressive disorder, single episode, unspecified: Secondary | ICD-10-CM | POA: Diagnosis not present

## 2019-05-21 DIAGNOSIS — I2510 Atherosclerotic heart disease of native coronary artery without angina pectoris: Secondary | ICD-10-CM | POA: Diagnosis not present

## 2019-05-21 DIAGNOSIS — I5032 Chronic diastolic (congestive) heart failure: Secondary | ICD-10-CM | POA: Diagnosis not present

## 2019-05-22 ENCOUNTER — Encounter: Payer: Self-pay | Admitting: Cardiology

## 2019-05-22 DIAGNOSIS — I6529 Occlusion and stenosis of unspecified carotid artery: Secondary | ICD-10-CM | POA: Insufficient documentation

## 2019-05-23 DIAGNOSIS — D485 Neoplasm of uncertain behavior of skin: Secondary | ICD-10-CM | POA: Diagnosis not present

## 2019-05-23 DIAGNOSIS — L570 Actinic keratosis: Secondary | ICD-10-CM | POA: Diagnosis not present

## 2019-05-23 DIAGNOSIS — L821 Other seborrheic keratosis: Secondary | ICD-10-CM | POA: Diagnosis not present

## 2019-05-23 DIAGNOSIS — C44519 Basal cell carcinoma of skin of other part of trunk: Secondary | ICD-10-CM | POA: Diagnosis not present

## 2019-05-23 DIAGNOSIS — Z85828 Personal history of other malignant neoplasm of skin: Secondary | ICD-10-CM | POA: Diagnosis not present

## 2019-05-23 DIAGNOSIS — C44319 Basal cell carcinoma of skin of other parts of face: Secondary | ICD-10-CM | POA: Diagnosis not present

## 2019-05-29 DIAGNOSIS — Z1159 Encounter for screening for other viral diseases: Secondary | ICD-10-CM | POA: Diagnosis not present

## 2019-06-01 DIAGNOSIS — K635 Polyp of colon: Secondary | ICD-10-CM | POA: Diagnosis not present

## 2019-06-01 DIAGNOSIS — D128 Benign neoplasm of rectum: Secondary | ICD-10-CM | POA: Diagnosis not present

## 2019-06-01 DIAGNOSIS — D124 Benign neoplasm of descending colon: Secondary | ICD-10-CM | POA: Diagnosis not present

## 2019-06-01 DIAGNOSIS — Z86010 Personal history of colonic polyps: Secondary | ICD-10-CM | POA: Diagnosis not present

## 2019-06-06 DIAGNOSIS — D128 Benign neoplasm of rectum: Secondary | ICD-10-CM | POA: Diagnosis not present

## 2019-06-06 DIAGNOSIS — K635 Polyp of colon: Secondary | ICD-10-CM | POA: Diagnosis not present

## 2019-06-06 DIAGNOSIS — D124 Benign neoplasm of descending colon: Secondary | ICD-10-CM | POA: Diagnosis not present

## 2019-06-14 DIAGNOSIS — M25512 Pain in left shoulder: Secondary | ICD-10-CM | POA: Diagnosis not present

## 2019-06-15 ENCOUNTER — Other Ambulatory Visit: Payer: Self-pay | Admitting: Cardiology

## 2019-06-15 MED ORDER — BISOPROLOL FUMARATE 5 MG PO TABS: 5.0000 mg | ORAL_TABLET | Freq: Every day | ORAL | 0 refills | Status: DC

## 2019-06-15 NOTE — Telephone Encounter (Signed)
Pt's medication was sent to pt's pharmacy as requested. Confirmation received.  °

## 2019-06-19 ENCOUNTER — Telehealth: Payer: Self-pay | Admitting: *Deleted

## 2019-06-19 DIAGNOSIS — M19112 Post-traumatic osteoarthritis, left shoulder: Secondary | ICD-10-CM | POA: Diagnosis not present

## 2019-06-19 DIAGNOSIS — M1712 Unilateral primary osteoarthritis, left knee: Secondary | ICD-10-CM | POA: Diagnosis not present

## 2019-06-19 DIAGNOSIS — Z006 Encounter for examination for normal comparison and control in clinical research program: Secondary | ICD-10-CM

## 2019-06-19 DIAGNOSIS — M25512 Pain in left shoulder: Secondary | ICD-10-CM | POA: Diagnosis not present

## 2019-06-19 DIAGNOSIS — M25562 Pain in left knee: Secondary | ICD-10-CM | POA: Diagnosis not present

## 2019-06-19 NOTE — Telephone Encounter (Signed)
Spoke to subject for visit T13-M33 in the clear research study. No aes or saes to report.  Scheduled a visit for labwork and also for his next standard clinic visit.  Answered questions regarding diet.

## 2019-06-27 ENCOUNTER — Encounter: Payer: PPO | Admitting: *Deleted

## 2019-06-27 ENCOUNTER — Other Ambulatory Visit: Payer: Self-pay

## 2019-06-27 DIAGNOSIS — Z006 Encounter for examination for normal comparison and control in clinical research program: Secondary | ICD-10-CM

## 2019-06-27 NOTE — Research (Signed)
Subject to research clinic for unscheduled labs and vitals.  No cos, aes or saes to report.  Next clinic visit scheduled.  Re-consented to [place] version 7.0 12Mar2020, local version 15Apr2020   Subject Name: Hayden [name]  Subject met inclusion and exclusion criteria.  The informed consent form, study requirements and expectations were reviewed with the subject and questions and concerns were addressed prior to the signing of the consent form.  The subject verbalized understanding of the trial requirements.  The subject agreed to participate in the Clear trial and signed the informed consent at [facility] on10/28/20.  The informed consent was obtained prior to performance of any protocol-specific procedures for the subject.  A copy of the signed informed consent was given to the subject and a copy was placed in the subject's medical record.   [place]

## 2019-07-05 ENCOUNTER — Other Ambulatory Visit: Payer: Self-pay | Admitting: Cardiology

## 2019-07-05 MED ORDER — AMLODIPINE BESYLATE 10 MG PO TABS: 10.0000 mg | ORAL_TABLET | Freq: Every day | ORAL | 0 refills | Status: DC

## 2019-07-10 ENCOUNTER — Telehealth: Payer: Self-pay | Admitting: *Deleted

## 2019-07-10 NOTE — Telephone Encounter (Signed)
I spoke with pt to get consent for virtual visit with Dr Radford Pax on 07/12/19.  Pt does not want virtual visit and would like to be seen in office. I scheduled pt to see Dr Radford Pax on January 12,2021 at 9:00.  Pt placed on wait list for sooner appointment if it becomes available.

## 2019-07-12 ENCOUNTER — Ambulatory Visit: Payer: PPO | Admitting: Cardiology

## 2019-07-13 ENCOUNTER — Ambulatory Visit: Payer: PPO | Admitting: Cardiology

## 2019-07-13 ENCOUNTER — Encounter: Payer: Self-pay | Admitting: Cardiology

## 2019-07-13 ENCOUNTER — Other Ambulatory Visit: Payer: Self-pay

## 2019-07-13 VITALS — BP 140/76 | HR 67 | Ht 68.0 in | Wt 183.2 lb

## 2019-07-13 DIAGNOSIS — I10 Essential (primary) hypertension: Secondary | ICD-10-CM | POA: Diagnosis not present

## 2019-07-13 DIAGNOSIS — I2510 Atherosclerotic heart disease of native coronary artery without angina pectoris: Secondary | ICD-10-CM | POA: Diagnosis not present

## 2019-07-13 DIAGNOSIS — I5032 Chronic diastolic (congestive) heart failure: Secondary | ICD-10-CM

## 2019-07-13 DIAGNOSIS — E7800 Pure hypercholesterolemia, unspecified: Secondary | ICD-10-CM

## 2019-07-13 NOTE — Progress Notes (Signed)
Cardiology Office Note:    Date:  07/13/2019   ID:  Hayden [name], DOB 1945-10-13, MRN [id number]  PCP:  Donald Prose, MD  Cardiologist:  Fransico Him, MD    Referring MD: Donald Prose, MD   Chief Complaint  Patient presents with  . Coronary Artery Disease  . Hypertension  . Hyperlipidemia  . Congestive Heart Failure    History of Present Illness:    Hayden [name] is a 73 y.o. male with a hx of  ASCAD s/p MI in 1994 with PTCA of the LCx and then CABG in [id number], chronic diastolic CHF, HTN and dyslipidemia.  He is here today for followup and is doing well.  He denies any chest pain or pressure, SOB, DOE, PND, orthopnea, LE edema, dizziness, palpitations or syncope. He is compliant with his meds and is tolerating meds with no SE.    Past Medical History:  Diagnosis Date  . Anxiety disorder    No details  . Arthritis   . Bright's disease age 58  . Carotid artery stenosis    1-39% stenosis bilateraly by dopplers 2020  . Chronic diastolic CHF (congestive heart failure) ([place])   . Coronary artery disease    MI in 1994 with PTCA of left circ and then coronary bypass 2000  . Frequent urination   . H/O hiatal hernia   . Hyperlipidemia    statin intolerant  . Hypertension   . Hypogonadism male    No details available but receives replacement therapy  . Impaired hearing    both ears  . Kidney stones    100  in past plus stones  . Myocardial infarction ([place]) 10-31-1989  . Opiate addiction ([place])   . PONV (postoperative nausea and vomiting)    pt prefers epidural is possible    Past Surgical History:  Procedure Laterality Date  . ANGIOPLASTY  1991  . BILATERAL KNEE ARTHROSCOPY  yrs ago  . CARDIAC CATHETERIZATION  01-17-07  . CORONARY ANGIOPLASTY    . CORONARY ARTERY BYPASS GRAFT  10-2998   cabg x 3  . CYSTOSCOPY     10 to 12 times in past  . [place]   with mesh  . lithortripsy     x 3  . LUMBAR LAMINECTOMY  L 3 to L 4   Jul 01 1995  . NISSEN FUNDOPLICATION  123456   . TOTAL KNEE ARTHROPLASTY  10/27/2011   Procedure: TOTAL KNEE ARTHROPLASTY;  Surgeon: Gearlean Alf, MD;  Location: [facility];  Service: Orthopedics;  Laterality: Right;    Current Medications: Current Meds  Medication Sig  . AMBULATORY NON FORMULARY MEDICATION Take 180 mg by mouth daily. Medication Name: bempedoic acid vs a placebo, CLEAR Research Study drug provided  . amLODipine (NORVASC) 10 MG tablet Take 1 tablet (10 mg total) by mouth daily. Please keep upcoming appt with Dr. Radford Pax in November for future refill. Thank you  . aspirin EC 81 MG tablet Take 81 mg by mouth daily.  . bisoprolol (ZEBETA) 5 MG tablet Take 1 tablet (5 mg total) by mouth daily.  . Cholecalciferol (VITAMIN D) 2000 UNITS tablet Take 2,000 Units by mouth daily.  . Coenzyme Q10 (CO Q 10 PO) Take 1 tablet by mouth daily.  . Multiple Vitamin (MULTIVITAMIN WITH MINERALS) TABS Take 1 tablet by mouth daily.  . nitroGLYCERIN (NITROSTAT) 0.4 MG SL tablet Place 0.4 mg under the tongue every 5 (five) minutes as needed. For chest pain.  Marland Kitchen  Omega-3 Fatty Acids (FISH OIL) 1200 MG CAPS Take 1 capsule by mouth daily.  Marland Kitchen omeprazole (PRILOSEC) 40 MG capsule Take 40 mg by mouth daily.  . Zinc Sulfate (ZINC 15 PO) Take 1 tablet by mouth daily.     Allergies:   Celecoxib, Penicillins, Sulfa antibiotics, and Sulfamethoxazole   Social History   Socioeconomic History  . Marital status: Married    Spouse name: Not on file  . Number of children: Not on file  . Years of education: Not on file  . Highest education level: Not on file  Occupational History  . Occupation: Environmental manager: [employer]  Social Needs  . Financial resource strain: Not on file  . Food insecurity    Worry: Not on file    Inability: Not on file  . Transportation needs    Medical: Not on file    Non-medical: Not on file  Tobacco Use  . Smoking status: Never Smoker  . Smokeless tobacco: Never Used  Substance and Sexual Activity  . Alcohol  use: No    Comment: quit ETOH in 2013  . Drug use: Yes    Comment: from [facility]-currently detoxing off opiates  . Sexual activity: Not on file  Lifestyle  . Physical activity    Days per week: Not on file    Minutes per session: Not on file  . Stress: Not on file  Relationships  . Social Herbalist on phone: Not on file    Gets together: Not on file    Attends religious service: Not on file    Active member of club or organization: Not on file    Attends meetings of clubs or organizations: Not on file    Relationship status: Not on file  Other Topics Concern  . Not on file  Social History Narrative  . Not on file     Family History: The patient's family history includes Cancer - Lung in his father and mother; Heart attack in his father and mother.  ROS:   Please see the history of present illness.    ROS  All other systems reviewed and negative.   EKGs/Labs/Other Studies Reviewed:    The following studies were reviewed today: none  EKG:  EKG is not ordered today.    Recent Labs: No results found for requested labs within last 8760 hours.   Recent Lipid Panel    Component Value Date/Time   CHOL 195 07/13/2016 0805   CHOL 121 06/28/2013 0809   TRIG 148 07/13/2016 0805   TRIG 106 06/28/2013 0809   HDL 27 (L) 07/13/2016 0805   HDL 37 (L) 06/28/2013 0809   CHOLHDL 7.2 (H) 07/13/2016 0805   VLDL 30 07/13/2016 0805   LDLCALC 138 (H) 07/13/2016 0805   LDLCALC 63 06/28/2013 0809    Physical Exam:    VS:  BP 140/76   Pulse 67   Ht 5\' 8"  (1.727 m)   Wt 183 lb 3.2 oz (83.1 kg)   SpO2 97%   BMI 27.86 kg/m     Wt Readings from Last 3 Encounters:  07/13/19 183 lb 3.2 oz (83.1 kg)  09/04/18 178 lb (80.7 kg)  07/12/18 183 lb (83 kg)     GEN:  Well nourished, well developed in no acute distress HEENT: Normal NECK: No JVD; No carotid bruits LYMPHATICS: No lymphadenopathy CARDIAC: RRR, no murmurs, rubs, gallops RESPIRATORY:  Clear to  auscultation without rales, wheezing or  rhonchi  ABDOMEN: Soft, non-tender, non-distended MUSCULOSKELETAL:  No edema; No deformity  SKIN: Warm and dry NEUROLOGIC:  Alert and oriented x 3 PSYCHIATRIC:  Normal affect   ASSESSMENT:    1. Coronary artery disease involving native coronary artery of native heart without angina pectoris   2. Essential hypertension   3. Chronic diastolic CHF (congestive heart failure) ([place])   4. Pure hypercholesterolemia    PLAN:    In order of problems listed above:  1.  ASCAD -s/p remote MI in 1994 with PTCA of the left circumflex and subsequent CABG in 2000. -he denies any anginal CP -continue ASA 81mg  daily, BB  2.  HTN -BP controlled on exam -continue Bisoprolol 5mg  daily, amlodipine 10mg  daily   3.  Chronic diastolic CHF -he appears euvolemic on exam -He has not required any diuretics  4.  HLD -LDL goal is < 70 -he is also in the Clear Research Study   Medication Adjustments/Labs and Tests Ordered: Current medicines are reviewed at length with the patient today.  Concerns regarding medicines are outlined above.  No orders of the defined types were placed in this encounter.  No orders of the defined types were placed in this encounter.   Signed, Fransico Him, MD  07/13/2019 1:52 PM    [place]

## 2019-07-13 NOTE — Patient Instructions (Signed)
Medication Instructions:   Your physician recommends that you continue on your current medications as directed. Please refer to the Current Medication list given to you today.  *If you need a refill on your cardiac medications before your next appointment, please call your pharmacy*    Follow-Up: At [facility], you and your health needs are our priority.  As part of our continuing mission to provide you with exceptional heart care, we have created designated Provider Care Teams.  These Care Teams include your primary Cardiologist (physician) and Advanced Practice Providers (APPs -  Physician Assistants and Nurse Practitioners) who all work together to provide you with the care you need, when you need it.  Your next appointment:   12 months  The format for your next appointment:   In Person  Provider:   Fransico Him, MD

## 2019-07-16 ENCOUNTER — Other Ambulatory Visit: Payer: Self-pay | Admitting: Cardiology

## 2019-07-16 MED ORDER — AMLODIPINE BESYLATE 10 MG PO TABS: 10.0000 mg | ORAL_TABLET | Freq: Every day | ORAL | 3 refills | Status: DC

## 2019-07-16 NOTE — Telephone Encounter (Signed)
Pt's medication was sent to pt's pharmacy as requested. Confirmation received.  °

## 2019-07-24 DIAGNOSIS — M25562 Pain in left knee: Secondary | ICD-10-CM | POA: Diagnosis not present

## 2019-08-13 DIAGNOSIS — E785 Hyperlipidemia, unspecified: Secondary | ICD-10-CM | POA: Diagnosis not present

## 2019-08-13 DIAGNOSIS — I5032 Chronic diastolic (congestive) heart failure: Secondary | ICD-10-CM | POA: Diagnosis not present

## 2019-08-13 DIAGNOSIS — I10 Essential (primary) hypertension: Secondary | ICD-10-CM | POA: Diagnosis not present

## 2019-08-13 DIAGNOSIS — I2510 Atherosclerotic heart disease of native coronary artery without angina pectoris: Secondary | ICD-10-CM | POA: Diagnosis not present

## 2019-08-13 DIAGNOSIS — E039 Hypothyroidism, unspecified: Secondary | ICD-10-CM | POA: Diagnosis not present

## 2019-08-13 DIAGNOSIS — I252 Old myocardial infarction: Secondary | ICD-10-CM | POA: Diagnosis not present

## 2019-08-13 DIAGNOSIS — F329 Major depressive disorder, single episode, unspecified: Secondary | ICD-10-CM | POA: Diagnosis not present

## 2019-09-05 ENCOUNTER — Other Ambulatory Visit: Payer: Self-pay

## 2019-09-11 ENCOUNTER — Ambulatory Visit: Payer: PPO | Admitting: Cardiology

## 2019-09-13 ENCOUNTER — Other Ambulatory Visit: Payer: Self-pay | Admitting: Cardiology

## 2019-09-19 ENCOUNTER — Ambulatory Visit: Payer: PPO | Attending: Internal Medicine

## 2019-09-19 DIAGNOSIS — Z23 Encounter for immunization: Secondary | ICD-10-CM | POA: Insufficient documentation

## 2019-10-01 ENCOUNTER — Other Ambulatory Visit: Payer: Self-pay | Admitting: Cardiology

## 2019-10-09 ENCOUNTER — Telehealth: Payer: Self-pay | Admitting: Cardiology

## 2019-10-09 DIAGNOSIS — R0602 Shortness of breath: Secondary | ICD-10-CM

## 2019-10-09 NOTE — Telephone Encounter (Signed)
Lets get a 2D echo for SOB and BNP

## 2019-10-09 NOTE — Telephone Encounter (Signed)
I spoke with patient. He reports a cough for the last seven years.  It is off and on. Has seen PCP and pulmonary for this.  He states history of problem with prescription pain pills in the past and because of this did not want to take a medication that was recommended by pulmonary. Cough is a dry, hacking cough. He uses cough drops and this helps for awhile. He was not having a lot of problems with his cough when he last saw Dr Radford Pax in November 2020. Last saw PCP about 6 months ago. Telmisartan was stopped 6-8 months ago per patient report due to cough. Pt is concerned cough is related to CHF.  He does not weigh daily but reports no recent change in weight.  No swelling in feet or ankles. Able to exercise 30-45 minutes without shortness of breath. Does feel a slight shortness of breath at times like when bending over to ties his shoes. Is taking omeprazole daily.  Will forward to Dr Radford Pax for review/recomendations.Marland Kitchen

## 2019-10-09 NOTE — Telephone Encounter (Signed)
Left message to call office

## 2019-10-09 NOTE — Telephone Encounter (Signed)
Patient states he has been having a cough for six years and spoke with his PCP who suggested a narcotic. He also states he believes his dry cough may be a sign of CHF and would like to speak with a nurse or pharmacist.

## 2019-10-09 NOTE — Telephone Encounter (Signed)
I spoke with patient and scheduled him for echo and BNP on 2/22. Echo appointment is 7:15

## 2019-10-10 ENCOUNTER — Ambulatory Visit: Payer: PPO | Attending: Internal Medicine

## 2019-10-10 DIAGNOSIS — Z23 Encounter for immunization: Secondary | ICD-10-CM | POA: Insufficient documentation

## 2019-10-10 NOTE — Progress Notes (Signed)
   Covid-19 Vaccination Clinic  Name:  Hayden [name]    MRN: [id number] DOB: 30-Oct-1945  10/10/2019  [name] was observed post Covid-19 immunization for 15 minutes without incidence. He was provided with Vaccine Information Sheet and instruction to access the V-Safe system.   [name] was instructed to call 911 with any severe reactions post vaccine: Marland Kitchen Difficulty breathing  . Swelling of your face and throat  . A fast heartbeat  . A bad rash all over your body  . Dizziness and weakness    Immunizations Administered    Name Date Dose VIS Date Route   Pfizer COVID-19 Vaccine 10/10/2019  2:56 PM 0.3 mL 08/10/2019 Intramuscular   Manufacturer: [place]   Lot: [id number]   Blunt: [id number]

## 2019-10-22 ENCOUNTER — Ambulatory Visit (HOSPITAL_COMMUNITY): Payer: PPO | Attending: Cardiovascular Disease

## 2019-10-22 ENCOUNTER — Other Ambulatory Visit: Payer: Self-pay

## 2019-10-22 ENCOUNTER — Telehealth: Payer: Self-pay

## 2019-10-22 ENCOUNTER — Encounter: Payer: Self-pay | Admitting: Cardiology

## 2019-10-22 ENCOUNTER — Other Ambulatory Visit: Payer: PPO | Admitting: *Deleted

## 2019-10-22 DIAGNOSIS — R0602 Shortness of breath: Secondary | ICD-10-CM | POA: Insufficient documentation

## 2019-10-22 DIAGNOSIS — I371 Nonrheumatic pulmonary valve insufficiency: Secondary | ICD-10-CM | POA: Insufficient documentation

## 2019-10-22 NOTE — Telephone Encounter (Signed)
-----   Message from Sueanne Margarita, MD sent at 10/22/2019  1:32 PM EST ----- 2D echo showed mildly reduced LVF with EF 45-50% with mildly thickened and stiff heart muscle, moderately enlarged LA, mildly leaky MV and moderately leaky PV.  Please repeat echo in 1 year for PR.  EF similar to what it was in the past.  Please have him come in for BNP to make sure he is not retaining too much fluid.

## 2019-10-23 LAB — PRO B NATRIURETIC PEPTIDE: NT-Pro BNP: 265 pg/mL (ref 0–376)

## 2019-11-02 DIAGNOSIS — I2510 Atherosclerotic heart disease of native coronary artery without angina pectoris: Secondary | ICD-10-CM | POA: Diagnosis not present

## 2019-11-02 DIAGNOSIS — I10 Essential (primary) hypertension: Secondary | ICD-10-CM | POA: Diagnosis not present

## 2019-11-02 DIAGNOSIS — I252 Old myocardial infarction: Secondary | ICD-10-CM | POA: Diagnosis not present

## 2019-11-02 DIAGNOSIS — F329 Major depressive disorder, single episode, unspecified: Secondary | ICD-10-CM | POA: Diagnosis not present

## 2019-11-02 DIAGNOSIS — E039 Hypothyroidism, unspecified: Secondary | ICD-10-CM | POA: Diagnosis not present

## 2019-11-02 DIAGNOSIS — E785 Hyperlipidemia, unspecified: Secondary | ICD-10-CM | POA: Diagnosis not present

## 2019-11-02 DIAGNOSIS — I5032 Chronic diastolic (congestive) heart failure: Secondary | ICD-10-CM | POA: Diagnosis not present

## 2019-11-27 DIAGNOSIS — L820 Inflamed seborrheic keratosis: Secondary | ICD-10-CM | POA: Diagnosis not present

## 2019-11-27 DIAGNOSIS — Z85828 Personal history of other malignant neoplasm of skin: Secondary | ICD-10-CM | POA: Diagnosis not present

## 2019-11-27 DIAGNOSIS — D485 Neoplasm of uncertain behavior of skin: Secondary | ICD-10-CM | POA: Diagnosis not present

## 2019-11-27 DIAGNOSIS — L119 Acantholytic disorder, unspecified: Secondary | ICD-10-CM | POA: Diagnosis not present

## 2019-11-27 DIAGNOSIS — L570 Actinic keratosis: Secondary | ICD-10-CM | POA: Diagnosis not present

## 2019-11-27 DIAGNOSIS — L821 Other seborrheic keratosis: Secondary | ICD-10-CM | POA: Diagnosis not present

## 2019-11-27 DIAGNOSIS — D0462 Carcinoma in situ of skin of left upper limb, including shoulder: Secondary | ICD-10-CM | POA: Diagnosis not present

## 2020-01-02 DIAGNOSIS — M542 Cervicalgia: Secondary | ICD-10-CM | POA: Diagnosis not present

## 2020-01-02 DIAGNOSIS — M1712 Unilateral primary osteoarthritis, left knee: Secondary | ICD-10-CM | POA: Diagnosis not present

## 2020-01-02 DIAGNOSIS — M25512 Pain in left shoulder: Secondary | ICD-10-CM | POA: Diagnosis not present

## 2020-01-31 DIAGNOSIS — Z471 Aftercare following joint replacement surgery: Secondary | ICD-10-CM | POA: Diagnosis not present

## 2020-01-31 DIAGNOSIS — Z96651 Presence of right artificial knee joint: Secondary | ICD-10-CM | POA: Diagnosis not present

## 2020-01-31 DIAGNOSIS — M1712 Unilateral primary osteoarthritis, left knee: Secondary | ICD-10-CM | POA: Diagnosis not present

## 2020-02-18 DIAGNOSIS — I5032 Chronic diastolic (congestive) heart failure: Secondary | ICD-10-CM | POA: Diagnosis not present

## 2020-02-18 DIAGNOSIS — I252 Old myocardial infarction: Secondary | ICD-10-CM | POA: Diagnosis not present

## 2020-02-18 DIAGNOSIS — E785 Hyperlipidemia, unspecified: Secondary | ICD-10-CM | POA: Diagnosis not present

## 2020-02-18 DIAGNOSIS — F329 Major depressive disorder, single episode, unspecified: Secondary | ICD-10-CM | POA: Diagnosis not present

## 2020-02-18 DIAGNOSIS — I10 Essential (primary) hypertension: Secondary | ICD-10-CM | POA: Diagnosis not present

## 2020-02-18 DIAGNOSIS — E039 Hypothyroidism, unspecified: Secondary | ICD-10-CM | POA: Diagnosis not present

## 2020-02-18 DIAGNOSIS — I2510 Atherosclerotic heart disease of native coronary artery without angina pectoris: Secondary | ICD-10-CM | POA: Diagnosis not present

## 2020-02-27 ENCOUNTER — Other Ambulatory Visit: Payer: Self-pay

## 2020-02-27 ENCOUNTER — Encounter: Payer: PPO | Admitting: *Deleted

## 2020-02-27 VITALS — BP 135/58

## 2020-02-27 DIAGNOSIS — Z006 Encounter for examination for normal comparison and control in clinical research program: Secondary | ICD-10-CM

## 2020-02-27 NOTE — Research (Signed)
Subject to research clinic for Clear T16-M42 visit. No AE?SAE or con medication changes to report. Pt given IP at [place]. Next clinic visit scheduled

## 2020-02-27 NOTE — Research (Signed)
Re-consented to [place] version 8.1 [id number], local version [id number]   Subject Name: Hayden [name]  Subject met inclusion and exclusion criteria.  The informed consent form, study requirements and expectations were reviewed with the subject and questions and concerns were addressed prior to the signing of the consent form.  The subject verbalized understanding of the trial requirements.  The subject agreed to participate in the Clear trial and signed the informed consent at [facility] on 02/27/20  The informed consent was obtained prior to performance of any protocol-specific procedures for the subject.  A copy of the signed informed consent was given to the subject and a copy was placed in the subject's medical record.   [name]

## 2020-03-13 DIAGNOSIS — M1712 Unilateral primary osteoarthritis, left knee: Secondary | ICD-10-CM | POA: Diagnosis not present

## 2020-03-14 DIAGNOSIS — H25043 Posterior subcapsular polar age-related cataract, bilateral: Secondary | ICD-10-CM | POA: Diagnosis not present

## 2020-03-14 DIAGNOSIS — H52203 Unspecified astigmatism, bilateral: Secondary | ICD-10-CM | POA: Diagnosis not present

## 2020-03-14 DIAGNOSIS — H5213 Myopia, bilateral: Secondary | ICD-10-CM | POA: Diagnosis not present

## 2020-03-20 DIAGNOSIS — M1712 Unilateral primary osteoarthritis, left knee: Secondary | ICD-10-CM | POA: Diagnosis not present

## 2020-03-27 DIAGNOSIS — M1712 Unilateral primary osteoarthritis, left knee: Secondary | ICD-10-CM | POA: Diagnosis not present

## 2020-05-19 DIAGNOSIS — Z1389 Encounter for screening for other disorder: Secondary | ICD-10-CM | POA: Diagnosis not present

## 2020-05-19 DIAGNOSIS — Z85820 Personal history of malignant melanoma of skin: Secondary | ICD-10-CM | POA: Diagnosis not present

## 2020-05-19 DIAGNOSIS — I5032 Chronic diastolic (congestive) heart failure: Secondary | ICD-10-CM | POA: Diagnosis not present

## 2020-05-19 DIAGNOSIS — I10 Essential (primary) hypertension: Secondary | ICD-10-CM | POA: Diagnosis not present

## 2020-05-19 DIAGNOSIS — I2510 Atherosclerotic heart disease of native coronary artery without angina pectoris: Secondary | ICD-10-CM | POA: Diagnosis not present

## 2020-05-19 DIAGNOSIS — E785 Hyperlipidemia, unspecified: Secondary | ICD-10-CM | POA: Diagnosis not present

## 2020-05-19 DIAGNOSIS — Z0000 Encounter for general adult medical examination without abnormal findings: Secondary | ICD-10-CM | POA: Diagnosis not present

## 2020-05-22 ENCOUNTER — Telehealth: Payer: Self-pay

## 2020-05-22 DIAGNOSIS — Z006 Encounter for examination for normal comparison and control in clinical research program: Secondary | ICD-10-CM

## 2020-05-22 NOTE — Telephone Encounter (Signed)
Contacted patient for CLEAR T17 M45 visit. Left VM asking patient to call back.

## 2020-05-22 NOTE — Telephone Encounter (Signed)
Patient returned call for CLEAR T17 [place] visit. No SAEs/AEs. Reviewed concomitant meds.

## 2020-06-24 DIAGNOSIS — I252 Old myocardial infarction: Secondary | ICD-10-CM | POA: Diagnosis not present

## 2020-06-24 DIAGNOSIS — I5032 Chronic diastolic (congestive) heart failure: Secondary | ICD-10-CM | POA: Diagnosis not present

## 2020-06-24 DIAGNOSIS — E039 Hypothyroidism, unspecified: Secondary | ICD-10-CM | POA: Diagnosis not present

## 2020-06-24 DIAGNOSIS — I10 Essential (primary) hypertension: Secondary | ICD-10-CM | POA: Diagnosis not present

## 2020-06-24 DIAGNOSIS — E785 Hyperlipidemia, unspecified: Secondary | ICD-10-CM | POA: Diagnosis not present

## 2020-06-24 DIAGNOSIS — I2510 Atherosclerotic heart disease of native coronary artery without angina pectoris: Secondary | ICD-10-CM | POA: Diagnosis not present

## 2020-06-24 DIAGNOSIS — F329 Major depressive disorder, single episode, unspecified: Secondary | ICD-10-CM | POA: Diagnosis not present

## 2020-07-10 DIAGNOSIS — L821 Other seborrheic keratosis: Secondary | ICD-10-CM | POA: Diagnosis not present

## 2020-07-10 DIAGNOSIS — D485 Neoplasm of uncertain behavior of skin: Secondary | ICD-10-CM | POA: Diagnosis not present

## 2020-07-10 DIAGNOSIS — C44311 Basal cell carcinoma of skin of nose: Secondary | ICD-10-CM | POA: Diagnosis not present

## 2020-07-10 DIAGNOSIS — L570 Actinic keratosis: Secondary | ICD-10-CM | POA: Diagnosis not present

## 2020-07-10 DIAGNOSIS — Z85828 Personal history of other malignant neoplasm of skin: Secondary | ICD-10-CM | POA: Diagnosis not present

## 2020-07-10 DIAGNOSIS — L814 Other melanin hyperpigmentation: Secondary | ICD-10-CM | POA: Diagnosis not present

## 2020-07-16 ENCOUNTER — Ambulatory Visit: Payer: PPO | Admitting: Cardiology

## 2020-07-16 ENCOUNTER — Encounter: Payer: Self-pay | Admitting: Cardiology

## 2020-07-16 ENCOUNTER — Other Ambulatory Visit: Payer: Self-pay

## 2020-07-16 VITALS — BP 120/58 | HR 58 | Ht 68.0 in | Wt 165.0 lb

## 2020-07-16 DIAGNOSIS — I5032 Chronic diastolic (congestive) heart failure: Secondary | ICD-10-CM

## 2020-07-16 DIAGNOSIS — I6523 Occlusion and stenosis of bilateral carotid arteries: Secondary | ICD-10-CM

## 2020-07-16 DIAGNOSIS — E7800 Pure hypercholesterolemia, unspecified: Secondary | ICD-10-CM

## 2020-07-16 DIAGNOSIS — I10 Essential (primary) hypertension: Secondary | ICD-10-CM | POA: Diagnosis not present

## 2020-07-16 DIAGNOSIS — I2510 Atherosclerotic heart disease of native coronary artery without angina pectoris: Secondary | ICD-10-CM

## 2020-07-16 MED ORDER — NITROGLYCERIN 0.4 MG SL SUBL: 0.4000 mg | SUBLINGUAL_TABLET | SUBLINGUAL | 3 refills | Status: DC | PRN

## 2020-07-16 MED ORDER — BISOPROLOL FUMARATE 5 MG PO TABS: 2.5000 mg | ORAL_TABLET | Freq: Every day | ORAL | 3 refills | Status: DC

## 2020-07-16 NOTE — Progress Notes (Signed)
Cardiology Office Note:    Date:  07/16/2020   ID:  Malen Gauze, DOB 06/10/1946, MRN [id number]  PCP:  Donald Prose, MD  Cardiologist:  Fransico Him, MD    Referring MD: Donald Prose, MD   Chief Complaint  Patient presents with  . Coronary Artery Disease  . Hyperlipidemia  . Hypertension  . Congestive Heart Failure    History of Present Illness:    Hayden [name] is a 74 y.o. male with a hx of  ASCAD s/p MI in 1994 with PTCA of the LCx and then CABG in 3734, chronic diastolic CHF, HTN and dyslipidemia.  He is here today for followup and is doing well.  he denies any chest pain or pressure, SOB, DOE, PND, orthopnea, LE edema, dizziness, palpitations or syncope. He had started dieting and exercising and has lost 18lbs since last year. He is compliant with his meds and is tolerating meds with no SE.    Past Medical History:  Diagnosis Date  . Anxiety disorder    No details  . Arthritis   . Bright's disease age 72  . Carotid artery stenosis    1-39% stenosis bilateraly by dopplers 2020  . Chronic diastolic CHF (congestive heart failure) ([place])   . Coronary artery disease    MI in 1994 with PTCA of left circ and then coronary bypass 2000  . Frequent urination   . H/O hiatal hernia   . Hyperlipidemia    statin intolerant  . Hypertension   . Hypogonadism male    No details available but receives replacement therapy  . Impaired hearing    both ears  . Kidney stones    100  in past plus stones  . Myocardial infarction ([place]) 10-31-1989  . Opiate addiction ([place])   . PONV (postoperative nausea and vomiting)    pt prefers epidural is possible  . Pulmonary regurgitation    moderate by echo 10/2019    Past Surgical History:  Procedure Laterality Date  . ANGIOPLASTY  1991  . BILATERAL KNEE ARTHROSCOPY  yrs ago  . CARDIAC CATHETERIZATION  01-17-07  . CORONARY ANGIOPLASTY    . CORONARY ARTERY BYPASS GRAFT  [id number]   cabg x 3  . CYSTOSCOPY     10 to 12 times in past  . [place]   with mesh  . lithortripsy     x 3  . LUMBAR LAMINECTOMY  L 3 to L 4   Jul 01 1995  . NISSEN FUNDOPLICATION  2876  . TOTAL KNEE ARTHROPLASTY  10/27/2011   Procedure: TOTAL KNEE ARTHROPLASTY;  Surgeon: Gearlean Alf, MD;  Location: [facility];  Service: Orthopedics;  Laterality: Right;    Current Medications: Current Meds  Medication Sig  . AMBULATORY NON FORMULARY MEDICATION Take 180 mg by mouth daily. Medication Name: bempedoic acid vs a placebo, CLEAR Research Study drug provided  . amLODipine (NORVASC) 10 MG tablet Take 1 tablet (10 mg total) by mouth daily.  Marland Kitchen aspirin EC 81 MG tablet Take 81 mg by mouth daily.  . bisoprolol (ZEBETA) 5 MG tablet TAKE 1 TABLET(5 MG) BY MOUTH DAILY  . Cholecalciferol (VITAMIN D) 2000 UNITS tablet Take 2,000 Units by mouth daily.  . Coenzyme Q10 (CO Q 10 PO) Take 1 tablet by mouth daily.  . Multiple Vitamin (MULTIVITAMIN WITH MINERALS) TABS Take 1 tablet by mouth daily.  . nitroGLYCERIN (NITROSTAT) 0.4 MG SL tablet Place 0.4 mg under the tongue every 5 (five)  minutes as needed. For chest pain.  Marland Kitchen omeprazole (PRILOSEC) 40 MG capsule Take 40 mg by mouth daily.     Allergies:   Celecoxib, Penicillins, Sulfa antibiotics, and Sulfamethoxazole   Social History   Socioeconomic History  . Marital status: Married    Spouse name: Not on file  . Number of children: Not on file  . Years of education: Not on file  . Highest education level: Not on file  Occupational History  . Occupation: Environmental manager: [employer]  Tobacco Use  . Smoking status: Never Smoker  . Smokeless tobacco: Never Used  Vaping Use  . Vaping Use: Never used  Substance and Sexual Activity  . Alcohol use: No    Comment: quit ETOH in 2013  . Drug use: Yes    Comment: from [facility]-currently detoxing off opiates  . Sexual activity: Not on file  Other Topics Concern  . Not on file  Social History Narrative  . Not on file   Social Determinants of  Health   Financial Resource Strain:   . Difficulty of Paying Living Expenses: Not on file  Food Insecurity:   . Worried About Charity fundraiser in the Last Year: Not on file  . Ran Out of Food in the Last Year: Not on file  Transportation Needs:   . Lack of Transportation (Medical): Not on file  . Lack of Transportation (Non-Medical): Not on file  Physical Activity:   . Days of Exercise per Week: Not on file  . Minutes of Exercise per Session: Not on file  Stress:   . Feeling of Stress : Not on file  Social Connections:   . Frequency of Communication with Friends and Family: Not on file  . Frequency of Social Gatherings with Friends and Family: Not on file  . Attends Religious Services: Not on file  . Active Member of Clubs or Organizations: Not on file  . Attends Archivist Meetings: Not on file  . Marital Status: Not on file     Family History: The patient's family history includes Cancer - Lung in his father and mother; Heart attack in his father and mother.  ROS:   Please see the history of present illness.    ROS  All other systems reviewed and negative.   EKGs/Labs/Other Studies Reviewed:    The following studies were reviewed today: none  EKG:  EKG is ordered today and showed sinus bradycardia at 58bpm with nonspecific T wave abnormality unchanged from 09/2017  Recent Labs: 10/22/2019: NT-Pro BNP 265   Recent Lipid Panel    Component Value Date/Time   CHOL 195 07/13/2016 0805   CHOL 121 06/28/2013 0809   TRIG 148 07/13/2016 0805   TRIG 106 06/28/2013 0809   HDL 27 (L) 07/13/2016 0805   HDL 37 (L) 06/28/2013 0809   CHOLHDL 7.2 (H) 07/13/2016 0805   VLDL 30 07/13/2016 0805   LDLCALC 138 (H) 07/13/2016 0805   LDLCALC 63 06/28/2013 0809    Physical Exam:    VS:  BP (!) 120/58   Pulse (!) 58   Ht 5\' 8"  (1.727 m)   Wt 165 lb (74.8 kg)   SpO2 96%   BMI 25.09 kg/m     Wt Readings from Last 3 Encounters:  07/16/20 165 lb (74.8 kg)  07/13/19  183 lb 3.2 oz (83.1 kg)  09/04/18 178 lb (80.7 kg)     GEN: Well nourished, well developed in no  acute distress HEENT: Normal NECK: No JVD; No carotid bruits LYMPHATICS: No lymphadenopathy CARDIAC:RRR, no murmurs, rubs, gallops RESPIRATORY:  Clear to auscultation without rales, wheezing or rhonchi  ABDOMEN: Soft, non-tender, non-distended MUSCULOSKELETAL:  No edema; No deformity  SKIN: Warm and dry NEUROLOGIC:  Alert and oriented x 3 PSYCHIATRIC:  Normal affect    ASSESSMENT:    1. Coronary artery disease involving native coronary artery of native heart without angina pectoris   2. Primary hypertension   3. Chronic diastolic CHF (congestive heart failure) ([place])   4. Pure hypercholesterolemia   5. Bilateral carotid artery stenosis    PLAN:    In order of problems listed above:  1.  ASCAD -she has not had anginal symptoms and walks almost 6 miles 5 days weekly with no problems -continue ASA 81mg  daily, BB  2.  HTN -Bp controlled -continue amlodipine 10mg  daily -will wean down Bisoprolol to 2.5mg  daily -Check Bp and HR daily for a week and call with the results  3.  Chronic diastolic CHF -he has not had any DOE or LE edema -weight is stable and has actually lost 18lbs in the past year -He has not required any diuretics  4.  HLD -LDL goal is < 70 -he is also in the Clear Research Study  5.  Bilateral carotid artery stenosis -dopplers 2020 with 1-39%  -repeat in 2022 -continue ASA and statin   Medication Adjustments/Labs and Tests Ordered: Current medicines are reviewed at length with the patient today.  Concerns regarding medicines are outlined above.  No orders of the defined types were placed in this encounter.  No orders of the defined types were placed in this encounter.   Signed, Fransico Him, MD  07/16/2020 9:35 AM    [place]

## 2020-07-16 NOTE — Patient Instructions (Signed)
Please check your blood pressure and heart rate daily for one week and call us or send a MyChart message with a list of your readings.   Medication Instructions:  Your physician has recommended you make the following change in your medication:  1) DECREASE bisoprolol to 2.5 mg (1/2 tablet) daily  *If you need a refill on your cardiac medications before your next appointment, please call your pharmacy*  Follow-Up: At [facility], you and your health needs are our priority.  As part of our continuing mission to provide you with exceptional heart care, we have created designated Provider Care Teams.  These Care Teams include your primary Cardiologist (physician) and Advanced Practice Providers (APPs -  Physician Assistants and Nurse Practitioners) who all work together to provide you with the care you need, when you need it.  Your next appointment:   1 year(s)  The format for your next appointment:   In Person  Provider:   You may see Fransico Him, MD or one of the following Advanced Practice Providers on your designated Care Team:    Melina Copa, PA-C  Ermalinda Barrios, PA-C

## 2020-08-04 DIAGNOSIS — R7309 Other abnormal glucose: Secondary | ICD-10-CM | POA: Diagnosis not present

## 2020-08-05 DIAGNOSIS — C44311 Basal cell carcinoma of skin of nose: Secondary | ICD-10-CM | POA: Diagnosis not present

## 2020-08-05 DIAGNOSIS — Z85828 Personal history of other malignant neoplasm of skin: Secondary | ICD-10-CM | POA: Diagnosis not present

## 2020-08-27 DIAGNOSIS — I10 Essential (primary) hypertension: Secondary | ICD-10-CM | POA: Diagnosis not present

## 2020-08-27 DIAGNOSIS — E785 Hyperlipidemia, unspecified: Secondary | ICD-10-CM | POA: Diagnosis not present

## 2020-08-27 DIAGNOSIS — F329 Major depressive disorder, single episode, unspecified: Secondary | ICD-10-CM | POA: Diagnosis not present

## 2020-08-27 DIAGNOSIS — I5032 Chronic diastolic (congestive) heart failure: Secondary | ICD-10-CM | POA: Diagnosis not present

## 2020-08-27 DIAGNOSIS — E039 Hypothyroidism, unspecified: Secondary | ICD-10-CM | POA: Diagnosis not present

## 2020-08-27 DIAGNOSIS — K219 Gastro-esophageal reflux disease without esophagitis: Secondary | ICD-10-CM | POA: Diagnosis not present

## 2020-08-27 DIAGNOSIS — I2510 Atherosclerotic heart disease of native coronary artery without angina pectoris: Secondary | ICD-10-CM | POA: Diagnosis not present

## 2020-08-27 DIAGNOSIS — I252 Old myocardial infarction: Secondary | ICD-10-CM | POA: Diagnosis not present

## 2020-08-28 ENCOUNTER — Telehealth: Payer: Self-pay

## 2020-08-28 NOTE — Telephone Encounter (Signed)
Patient dropped of list of BP readings.  Per Dr. Mayford Knife his BP looks good and that it is okay if his HR is greater than 150 as long as it is not sustained for too long during exercise.  Called patient and left him a message with this information. Advised to call back with any questions.

## 2020-09-03 ENCOUNTER — Encounter: Payer: PPO | Admitting: *Deleted

## 2020-09-03 ENCOUNTER — Other Ambulatory Visit: Payer: Self-pay

## 2020-09-03 VITALS — BP 157/68 | HR 72

## 2020-09-03 DIAGNOSIS — Z006 Encounter for examination for normal comparison and control in clinical research program: Secondary | ICD-10-CM

## 2020-09-03 NOTE — Research (Signed)
Subject came into for CLEAR research study visit T18, M48. All concomitant medications have been reviewed and updated if applicable. No new AE's or SAE's to report to sponsor at this time. New IP was dispensed and next appointment was scheduled for Tuesday, July 12th, 2022 @ 0830.

## 2020-09-29 DIAGNOSIS — K219 Gastro-esophageal reflux disease without esophagitis: Secondary | ICD-10-CM | POA: Diagnosis not present

## 2020-09-29 DIAGNOSIS — Z86010 Personal history of colonic polyps: Secondary | ICD-10-CM | POA: Diagnosis not present

## 2020-10-02 DIAGNOSIS — M7711 Lateral epicondylitis, right elbow: Secondary | ICD-10-CM | POA: Diagnosis not present

## 2020-10-02 DIAGNOSIS — M25511 Pain in right shoulder: Secondary | ICD-10-CM | POA: Diagnosis not present

## 2020-10-02 DIAGNOSIS — M25521 Pain in right elbow: Secondary | ICD-10-CM | POA: Diagnosis not present

## 2020-10-02 DIAGNOSIS — M19121 Post-traumatic osteoarthritis, right elbow: Secondary | ICD-10-CM | POA: Diagnosis not present

## 2020-10-02 DIAGNOSIS — M25512 Pain in left shoulder: Secondary | ICD-10-CM | POA: Diagnosis not present

## 2020-10-07 ENCOUNTER — Other Ambulatory Visit: Payer: Self-pay | Admitting: Cardiology

## 2020-10-08 DIAGNOSIS — E785 Hyperlipidemia, unspecified: Secondary | ICD-10-CM | POA: Diagnosis not present

## 2020-10-08 DIAGNOSIS — E039 Hypothyroidism, unspecified: Secondary | ICD-10-CM | POA: Diagnosis not present

## 2020-10-08 DIAGNOSIS — I5032 Chronic diastolic (congestive) heart failure: Secondary | ICD-10-CM | POA: Diagnosis not present

## 2020-10-08 DIAGNOSIS — I10 Essential (primary) hypertension: Secondary | ICD-10-CM | POA: Diagnosis not present

## 2020-10-08 DIAGNOSIS — F329 Major depressive disorder, single episode, unspecified: Secondary | ICD-10-CM | POA: Diagnosis not present

## 2020-10-08 DIAGNOSIS — K219 Gastro-esophageal reflux disease without esophagitis: Secondary | ICD-10-CM | POA: Diagnosis not present

## 2020-10-08 DIAGNOSIS — I2510 Atherosclerotic heart disease of native coronary artery without angina pectoris: Secondary | ICD-10-CM | POA: Diagnosis not present

## 2020-10-08 DIAGNOSIS — I252 Old myocardial infarction: Secondary | ICD-10-CM | POA: Diagnosis not present

## 2020-10-13 DIAGNOSIS — M1812 Unilateral primary osteoarthritis of first carpometacarpal joint, left hand: Secondary | ICD-10-CM | POA: Diagnosis not present

## 2020-10-13 DIAGNOSIS — G5602 Carpal tunnel syndrome, left upper limb: Secondary | ICD-10-CM | POA: Diagnosis not present

## 2020-10-15 ENCOUNTER — Other Ambulatory Visit: Payer: Self-pay

## 2020-10-15 ENCOUNTER — Ambulatory Visit (HOSPITAL_COMMUNITY): Payer: PPO | Attending: Cardiology

## 2020-10-15 DIAGNOSIS — I371 Nonrheumatic pulmonary valve insufficiency: Secondary | ICD-10-CM | POA: Diagnosis not present

## 2020-10-15 LAB — ECHOCARDIOGRAM COMPLETE
AR max vel: 1.52 cm2
AV Area VTI: 1.65 cm2
AV Area mean vel: 1.47 cm2
AV Mean grad: 6.3 mmHg
AV Peak grad: 14.7 mmHg
Ao pk vel: 1.92 m/s
Area-P 1/2: 3.54 cm2
S' Lateral: 4.2 cm

## 2020-11-26 ENCOUNTER — Telehealth: Payer: Self-pay | Admitting: *Deleted

## 2020-12-01 NOTE — Telephone Encounter (Signed)
Completed phone call/chart review for visit T19, M51 for the CLEAR research study. All concomitant medications have been reviewed and updated if applicable. There are no new AE's or SAE's to report to sponsor at this time.

## 2020-12-08 DIAGNOSIS — H903 Sensorineural hearing loss, bilateral: Secondary | ICD-10-CM | POA: Diagnosis not present

## 2020-12-15 DIAGNOSIS — K219 Gastro-esophageal reflux disease without esophagitis: Secondary | ICD-10-CM | POA: Diagnosis not present

## 2020-12-15 DIAGNOSIS — I5032 Chronic diastolic (congestive) heart failure: Secondary | ICD-10-CM | POA: Diagnosis not present

## 2020-12-15 DIAGNOSIS — I252 Old myocardial infarction: Secondary | ICD-10-CM | POA: Diagnosis not present

## 2020-12-15 DIAGNOSIS — E785 Hyperlipidemia, unspecified: Secondary | ICD-10-CM | POA: Diagnosis not present

## 2020-12-15 DIAGNOSIS — E039 Hypothyroidism, unspecified: Secondary | ICD-10-CM | POA: Diagnosis not present

## 2020-12-15 DIAGNOSIS — I2510 Atherosclerotic heart disease of native coronary artery without angina pectoris: Secondary | ICD-10-CM | POA: Diagnosis not present

## 2020-12-15 DIAGNOSIS — I10 Essential (primary) hypertension: Secondary | ICD-10-CM | POA: Diagnosis not present

## 2020-12-15 DIAGNOSIS — F329 Major depressive disorder, single episode, unspecified: Secondary | ICD-10-CM | POA: Diagnosis not present

## 2021-02-04 DIAGNOSIS — R42 Dizziness and giddiness: Secondary | ICD-10-CM | POA: Diagnosis not present

## 2021-03-09 ENCOUNTER — Telehealth: Payer: Self-pay | Admitting: Cardiology

## 2021-03-09 NOTE — Telephone Encounter (Signed)
Patient wanted to know if he needs to have labs done prior to his appt in November. Please advise

## 2021-03-09 NOTE — Telephone Encounter (Signed)
Patient would like to come in a couple of days prior to his appointment to have lab work done. Will forward to Dr. Radford Pax to see if any lab work is needed.

## 2021-03-09 NOTE — Telephone Encounter (Signed)
Spoke with the patient and advised him that he will not need lab work done by Dr. Radford Pax since he is in the Clear trail and lab work is being done by them. Patient verbalized understanding.

## 2021-03-11 ENCOUNTER — Encounter: Payer: PPO | Admitting: *Deleted

## 2021-03-11 ENCOUNTER — Telehealth: Payer: Self-pay | Admitting: Cardiology

## 2021-03-11 ENCOUNTER — Other Ambulatory Visit: Payer: Self-pay

## 2021-03-11 VITALS — BP 137/71 | HR 65

## 2021-03-11 DIAGNOSIS — Z006 Encounter for examination for normal comparison and control in clinical research program: Secondary | ICD-10-CM

## 2021-03-11 DIAGNOSIS — E7800 Pure hypercholesterolemia, unspecified: Secondary | ICD-10-CM

## 2021-03-11 NOTE — Telephone Encounter (Signed)
Spoke with the patient who states that he had his last appointment for the CLEAR trial this morning. Patient states that he did good throughout the trial. Since he is no longer on the study medication he states that he would like to be seen in regards to his cholesterol.

## 2021-03-11 NOTE — Research (Signed)
Subject came into the research clinic today for their End of Study Visit for the CLEAR research trial. Subject's concomitant medications have been reviewed and updated in the [facility] system if applicable. There are no new AE's or SAE's to report to sponsor at this time. Subject was seen by the Sub-I of the study for a physical examination and an EKG was also performed. Subject was reminded that they'll receive a phone call from [place] in approximately 30 days from today for their post end of study call.

## 2021-03-11 NOTE — Telephone Encounter (Signed)
Referral has been placed. 

## 2021-03-11 NOTE — Telephone Encounter (Signed)
Patient states he finished a study today and needs to speak with someone about his cholesterol. He tried to schedule a sooner appointment with Dr. Radford Pax, but I did not see anything until October. He states that is unacceptable and requests to speak with someone about his cholesterol and the ending of his study.

## 2021-03-16 DIAGNOSIS — H25043 Posterior subcapsular polar age-related cataract, bilateral: Secondary | ICD-10-CM | POA: Diagnosis not present

## 2021-03-16 DIAGNOSIS — H5213 Myopia, bilateral: Secondary | ICD-10-CM | POA: Diagnosis not present

## 2021-03-18 DIAGNOSIS — L820 Inflamed seborrheic keratosis: Secondary | ICD-10-CM | POA: Diagnosis not present

## 2021-03-18 DIAGNOSIS — Z85828 Personal history of other malignant neoplasm of skin: Secondary | ICD-10-CM | POA: Diagnosis not present

## 2021-03-18 DIAGNOSIS — D225 Melanocytic nevi of trunk: Secondary | ICD-10-CM | POA: Diagnosis not present

## 2021-03-18 DIAGNOSIS — D485 Neoplasm of uncertain behavior of skin: Secondary | ICD-10-CM | POA: Diagnosis not present

## 2021-03-18 DIAGNOSIS — C44619 Basal cell carcinoma of skin of left upper limb, including shoulder: Secondary | ICD-10-CM | POA: Diagnosis not present

## 2021-03-18 DIAGNOSIS — L570 Actinic keratosis: Secondary | ICD-10-CM | POA: Diagnosis not present

## 2021-03-18 DIAGNOSIS — L821 Other seborrheic keratosis: Secondary | ICD-10-CM | POA: Diagnosis not present

## 2021-03-18 DIAGNOSIS — L814 Other melanin hyperpigmentation: Secondary | ICD-10-CM | POA: Diagnosis not present

## 2021-03-27 ENCOUNTER — Ambulatory Visit: Payer: PPO

## 2021-03-27 ENCOUNTER — Telehealth: Payer: Self-pay

## 2021-03-27 NOTE — Telephone Encounter (Signed)
-----   Message from Sueanne Margarita, MD sent at 03/27/2021 11:16 AM EDT ----- Thanks Gershon Mussel!    ,  can you get him into lipid clinic PharmD to address this  Thanks  Traci ----- Message ----- From: Hillary Bow, MD Sent: 03/25/2021   8:24 PM EDT To: Sueanne Margarita, MD  Dear Dr. Gari Crown and Dr. Radford Pax Your nice patient came in  for his final follow up visit in the CLEAR study, a randomized trial of bempedoic acid versus placebo in patients with elevated LDL and at high risk for CVD events.  He has been statin intolerant.  Lipid results have been blinded per FDA protocol.  He is no longer receiving study medication, and needs follow up so that a lipid can be obtained, and decisions made regarding additional or alternative therapies.  Thank you for referring him.    [place]

## 2021-03-27 NOTE — Telephone Encounter (Signed)
Patient has already been referred to lipid clinic and has an appointment on 8/15.

## 2021-03-31 ENCOUNTER — Telehealth: Payer: Self-pay | Admitting: *Deleted

## 2021-03-31 NOTE — Telephone Encounter (Signed)
Just completed the patient's Post End of Study phone call/chart review for the CLEAR research study. All concomitant medications have been reviewed and updated in the [facility] system if applicable. There are no new AE's or SAE's to report to sponsor for this subject.

## 2021-04-02 DIAGNOSIS — I252 Old myocardial infarction: Secondary | ICD-10-CM | POA: Diagnosis not present

## 2021-04-02 DIAGNOSIS — E785 Hyperlipidemia, unspecified: Secondary | ICD-10-CM | POA: Diagnosis not present

## 2021-04-02 DIAGNOSIS — I2510 Atherosclerotic heart disease of native coronary artery without angina pectoris: Secondary | ICD-10-CM | POA: Diagnosis not present

## 2021-04-02 DIAGNOSIS — E039 Hypothyroidism, unspecified: Secondary | ICD-10-CM | POA: Diagnosis not present

## 2021-04-02 DIAGNOSIS — I10 Essential (primary) hypertension: Secondary | ICD-10-CM | POA: Diagnosis not present

## 2021-04-02 DIAGNOSIS — I5032 Chronic diastolic (congestive) heart failure: Secondary | ICD-10-CM | POA: Diagnosis not present

## 2021-04-02 DIAGNOSIS — F329 Major depressive disorder, single episode, unspecified: Secondary | ICD-10-CM | POA: Diagnosis not present

## 2021-04-02 DIAGNOSIS — K219 Gastro-esophageal reflux disease without esophagitis: Secondary | ICD-10-CM | POA: Diagnosis not present

## 2021-04-13 ENCOUNTER — Other Ambulatory Visit: Payer: Self-pay

## 2021-04-13 ENCOUNTER — Ambulatory Visit (INDEPENDENT_AMBULATORY_CARE_PROVIDER_SITE_OTHER): Payer: PPO | Admitting: Pharmacist

## 2021-04-13 DIAGNOSIS — G720 Drug-induced myopathy: Secondary | ICD-10-CM | POA: Insufficient documentation

## 2021-04-13 DIAGNOSIS — E7800 Pure hypercholesterolemia, unspecified: Secondary | ICD-10-CM

## 2021-04-13 DIAGNOSIS — T466X5A Adverse effect of antihyperlipidemic and antiarteriosclerotic drugs, initial encounter: Secondary | ICD-10-CM

## 2021-04-13 LAB — LIPID PANEL
Chol/HDL Ratio: 8.5 ratio — ABNORMAL HIGH (ref 0.0–5.0)
Cholesterol, Total: 222 mg/dL — ABNORMAL HIGH (ref 100–199)
HDL: 26 mg/dL — ABNORMAL LOW (ref >39)
LDL Chol Calc (NIH): 167 mg/dL — ABNORMAL HIGH (ref 0–99)
Triglycerides: 156 mg/dL — ABNORMAL HIGH (ref 0–149)
VLDL Cholesterol Cal: 29 mg/dL (ref 5–40)

## 2021-04-13 NOTE — Progress Notes (Signed)
Patient ID: Hayden [name]                 DOB: 08-17-1946                    MRN: [id number]     HPI: Hayden [name] is a 75 y.o. male patient referred to lipid clinic by Dr Radford Pax. PMH is significant for ASCVD s/p MI in 1994 with PTCA of LCx, CABG in [id number], chronic diastolic CHF, HTN, and HLD. Pt has a history of statin intolerance and was previously enrolled in the CLEAR trial investigating bempedoic acid. He was referred to lipid clinic after trial ended recently for follow up lipid management.  Pt presents today in good spirits. Previously seen in lipid clinic in 2016, intolerant to 4 statins noted below. Was started on Praluent which he tolerated well and which lowered his LDL to the 40s. Only stopped secondary to pricing at the time. Tolerated med well in CLEAR trial, is unsure if he received placebo or active drug. Has lost weight through intermittent fasting, frequent exercise, and calorie counting over the past few years. Now weighs 165 lbs, was almost 190 lbs back in 2019.  Current Medications: none Intolerances: simvastatin '5mg'$  2x per week, rosuvastatin, pravastatin, Vytorin - myalgias; Praluent - cost Risk Factors: MI in 1994, CABG in 2000, CHF, HTN, age LDL goal: '55mg'$ /dL  Diet: Heart Healthy diet - targets 1500 calories a day  Exercise: Goes to the gym 3 days a week and golfs 2 days a week  Family History: Cancer - Lung in his father and mother; Heart attack in his father and mother.  Social History: Former alcohol and opiate use, denies tobacco use  Labs: no viewable labs since 2017 - results were blinded in CLEAR trial  Past Medical History:  Diagnosis Date   Anxiety disorder    No details   Arthritis    Bright's disease age 53   Carotid artery stenosis    1-39% stenosis bilateraly by dopplers 2020   Chronic diastolic CHF (congestive heart failure) ([facility])    Coronary artery disease    MI in 1994 with PTCA of left circ and then coronary bypass 2000   Frequent urination     H/O hiatal hernia    Hyperlipidemia    statin intolerant   Hypertension    Hypogonadism male    No details available but receives replacement therapy   Impaired hearing    both ears   Kidney stones    100  in past plus stones   Myocardial infarction ([place]) 10-31-1989   Opiate addiction ([facility])    PONV (postoperative nausea and vomiting)    pt prefers epidural is possible   Pulmonary regurgitation    moderate by echo 10/2019    Current Outpatient Medications on File Prior to Visit  Medication Sig Dispense Refill   amLODipine (NORVASC) 10 MG tablet TAKE 1 TABLET(10 MG) BY MOUTH DAILY 90 tablet 2   aspirin EC 81 MG tablet Take 81 mg by mouth daily.     bisoprolol (ZEBETA) 5 MG tablet Take 0.5 tablets (2.5 mg total) by mouth daily. 45 tablet 3   Cholecalciferol (VITAMIN D) 2000 UNITS tablet Take 2,000 Units by mouth daily.     Coenzyme Q10 (CO Q 10 PO) Take 1 tablet by mouth daily.     Multiple Vitamin (MULTIVITAMIN WITH MINERALS) TABS Take 1 tablet by mouth daily.     nitroGLYCERIN (NITROSTAT) 0.4 MG SL tablet  Place 1 tablet (0.4 mg total) under the tongue every 5 (five) minutes as needed. For chest pain. 25 tablet 3   omeprazole (PRILOSEC) 40 MG capsule Take 40 mg by mouth daily.     No current facility-administered medications on file prior to visit.    Allergies  Allergen Reactions   Celecoxib Other (See Comments)    Per Mar   Penicillins Other (See Comments)    Gi upset    Sulfa Antibiotics Swelling   Sulfamethoxazole Other (See Comments)    "hands and feet web"    Assessment/Plan:  1. Hyperlipidemia - Rechecking baseline lipids today since pt has completed CLEAR trial investigating bempedoic acid. He is intolerant to 4 statins. Previously took Praluent and tolerated well, only stopped secondary to cost. Repatha is now preferred on his formulary for $90/3 month supply which pt is ok with. Will submit prior auth once baseline labs result. Did also discuss ezetimibe as an  option; Nexletol/Nexlizet are not preferred on his formulary and are $200/3 month supply which pt does not wish to pursue. LDL goal < 55 due to progressive ASCVD.   E. , PharmD, BCACP, [place] [id number] [place], [place] [address] Phone: [phone]; Fax: [phone] 04/13/2021 9:46 AM

## 2021-04-13 NOTE — Patient Instructions (Addendum)
Your LDL goal is < 55  We'll recheck your cholesterol today to see where your baseline numbers are  Medication options: -Repatha injection once every 2 weeks - lowers LDL by 60%. $90 for a 3 month supply -Ezetimibe daily pill - lowers LDL by 20%. $30 for a 3 month supply -Nexletol (trial drug) 1 tablet daily - lowers LDL by 20%. Not preferred on your formulary, would be $200 for a 3 month supply -Nexlizet - combo pill of the 2 above, lowers LDL by 40%. Not preferred on formulary, would be $200 for a 3 month supply

## 2021-04-14 ENCOUNTER — Telehealth: Payer: Self-pay | Admitting: Pharmacist

## 2021-04-14 DIAGNOSIS — E7800 Pure hypercholesterolemia, unspecified: Secondary | ICD-10-CM

## 2021-04-14 MED ORDER — REPATHA SURECLICK 140 MG/ML ~~LOC~~ SOAJ: 1.0000 "pen " | SUBCUTANEOUS | 3 refills | Status: DC

## 2021-04-14 NOTE — Telephone Encounter (Signed)
Spoke with pt regarding lipid panel. Baseline LDL has increased back to 167 after CLEAR trial ended. Will start [place], prior auth approved through 07/13/21. Rx sent to pharmacy, will recheck labs in October.

## 2021-05-07 DIAGNOSIS — I252 Old myocardial infarction: Secondary | ICD-10-CM | POA: Diagnosis not present

## 2021-05-07 DIAGNOSIS — I10 Essential (primary) hypertension: Secondary | ICD-10-CM | POA: Diagnosis not present

## 2021-05-07 DIAGNOSIS — F329 Major depressive disorder, single episode, unspecified: Secondary | ICD-10-CM | POA: Diagnosis not present

## 2021-05-07 DIAGNOSIS — I2510 Atherosclerotic heart disease of native coronary artery without angina pectoris: Secondary | ICD-10-CM | POA: Diagnosis not present

## 2021-05-07 DIAGNOSIS — I5032 Chronic diastolic (congestive) heart failure: Secondary | ICD-10-CM | POA: Diagnosis not present

## 2021-05-07 DIAGNOSIS — E785 Hyperlipidemia, unspecified: Secondary | ICD-10-CM | POA: Diagnosis not present

## 2021-05-07 DIAGNOSIS — E039 Hypothyroidism, unspecified: Secondary | ICD-10-CM | POA: Diagnosis not present

## 2021-05-07 DIAGNOSIS — K219 Gastro-esophageal reflux disease without esophagitis: Secondary | ICD-10-CM | POA: Diagnosis not present

## 2021-06-05 DIAGNOSIS — R7303 Prediabetes: Secondary | ICD-10-CM | POA: Diagnosis not present

## 2021-06-05 DIAGNOSIS — E785 Hyperlipidemia, unspecified: Secondary | ICD-10-CM | POA: Diagnosis not present

## 2021-06-05 DIAGNOSIS — Z1389 Encounter for screening for other disorder: Secondary | ICD-10-CM | POA: Diagnosis not present

## 2021-06-05 DIAGNOSIS — Z85820 Personal history of malignant melanoma of skin: Secondary | ICD-10-CM | POA: Diagnosis not present

## 2021-06-05 DIAGNOSIS — I10 Essential (primary) hypertension: Secondary | ICD-10-CM | POA: Diagnosis not present

## 2021-06-05 DIAGNOSIS — F1120 Opioid dependence, uncomplicated: Secondary | ICD-10-CM | POA: Diagnosis not present

## 2021-06-05 DIAGNOSIS — Z951 Presence of aortocoronary bypass graft: Secondary | ICD-10-CM | POA: Diagnosis not present

## 2021-06-05 DIAGNOSIS — I2510 Atherosclerotic heart disease of native coronary artery without angina pectoris: Secondary | ICD-10-CM | POA: Diagnosis not present

## 2021-06-05 DIAGNOSIS — I5032 Chronic diastolic (congestive) heart failure: Secondary | ICD-10-CM | POA: Diagnosis not present

## 2021-06-05 DIAGNOSIS — Z0000 Encounter for general adult medical examination without abnormal findings: Secondary | ICD-10-CM | POA: Diagnosis not present

## 2021-06-10 DIAGNOSIS — E1165 Type 2 diabetes mellitus with hyperglycemia: Secondary | ICD-10-CM | POA: Diagnosis not present

## 2021-06-24 ENCOUNTER — Other Ambulatory Visit: Payer: PPO | Admitting: *Deleted

## 2021-06-24 ENCOUNTER — Other Ambulatory Visit: Payer: Self-pay

## 2021-06-24 DIAGNOSIS — E7800 Pure hypercholesterolemia, unspecified: Secondary | ICD-10-CM | POA: Diagnosis not present

## 2021-06-24 DIAGNOSIS — E039 Hypothyroidism, unspecified: Secondary | ICD-10-CM | POA: Diagnosis not present

## 2021-06-24 DIAGNOSIS — I10 Essential (primary) hypertension: Secondary | ICD-10-CM | POA: Diagnosis not present

## 2021-06-24 DIAGNOSIS — E1165 Type 2 diabetes mellitus with hyperglycemia: Secondary | ICD-10-CM | POA: Diagnosis not present

## 2021-06-24 DIAGNOSIS — K219 Gastro-esophageal reflux disease without esophagitis: Secondary | ICD-10-CM | POA: Diagnosis not present

## 2021-06-24 DIAGNOSIS — E785 Hyperlipidemia, unspecified: Secondary | ICD-10-CM | POA: Diagnosis not present

## 2021-06-24 DIAGNOSIS — E1169 Type 2 diabetes mellitus with other specified complication: Secondary | ICD-10-CM | POA: Diagnosis not present

## 2021-06-24 DIAGNOSIS — F329 Major depressive disorder, single episode, unspecified: Secondary | ICD-10-CM | POA: Diagnosis not present

## 2021-06-24 DIAGNOSIS — I2510 Atherosclerotic heart disease of native coronary artery without angina pectoris: Secondary | ICD-10-CM | POA: Diagnosis not present

## 2021-06-24 DIAGNOSIS — I5032 Chronic diastolic (congestive) heart failure: Secondary | ICD-10-CM | POA: Diagnosis not present

## 2021-06-24 LAB — LIPID PANEL
Chol/HDL Ratio: 2.4 ratio (ref 0.0–5.0)
Cholesterol, Total: 66 mg/dL — ABNORMAL LOW (ref 100–199)
HDL: 27 mg/dL — ABNORMAL LOW (ref >39)
LDL Chol Calc (NIH): 17 mg/dL (ref 0–99)
Triglycerides: 119 mg/dL (ref 0–149)
VLDL Cholesterol Cal: 22 mg/dL (ref 5–40)

## 2021-06-24 LAB — ALT: ALT: 32 IU/L (ref 0–44)

## 2021-06-25 ENCOUNTER — Telehealth: Payer: Self-pay | Admitting: Pharmacist

## 2021-06-25 NOTE — Telephone Encounter (Signed)
Pt with hyperresponsive LDL lowering on Repatha, LDL dropped by 90% from 167 down to 17. Spoke with pt who reports tolerating medication well, he was very pleased with his results and is aware to continue on Repatha.

## 2021-07-15 ENCOUNTER — Encounter: Payer: Self-pay | Admitting: Cardiology

## 2021-07-15 ENCOUNTER — Other Ambulatory Visit: Payer: Self-pay

## 2021-07-15 ENCOUNTER — Ambulatory Visit: Payer: PPO | Admitting: Cardiology

## 2021-07-15 VITALS — BP 120/60 | HR 58 | Ht 68.0 in | Wt 170.0 lb

## 2021-07-15 DIAGNOSIS — E7800 Pure hypercholesterolemia, unspecified: Secondary | ICD-10-CM | POA: Diagnosis not present

## 2021-07-15 DIAGNOSIS — I10 Essential (primary) hypertension: Secondary | ICD-10-CM

## 2021-07-15 DIAGNOSIS — I6523 Occlusion and stenosis of bilateral carotid arteries: Secondary | ICD-10-CM

## 2021-07-15 DIAGNOSIS — I2510 Atherosclerotic heart disease of native coronary artery without angina pectoris: Secondary | ICD-10-CM | POA: Diagnosis not present

## 2021-07-15 DIAGNOSIS — I5032 Chronic diastolic (congestive) heart failure: Secondary | ICD-10-CM

## 2021-07-15 MED ORDER — AMLODIPINE BESYLATE 10 MG PO TABS: ORAL_TABLET | ORAL | 3 refills | Status: DC

## 2021-07-15 MED ORDER — BISOPROLOL FUMARATE 5 MG PO TABS: 2.5000 mg | ORAL_TABLET | Freq: Every day | ORAL | 3 refills | Status: DC

## 2021-07-15 NOTE — Addendum Note (Signed)
Addended by: Antonieta Iba on: 07/15/2021 09:34 AM   Modules accepted: Orders

## 2021-07-15 NOTE — Progress Notes (Signed)
Cardiology Office Note:    Date:  07/15/2021   ID:  Hayden [name], DOB 15-Nov-1945, MRN [id number]  PCP:  Donald Prose, MD  Cardiologist:  Fransico Him, MD    Referring MD: Donald Prose, MD   Chief Complaint  Patient presents with   Coronary Artery Disease   Hypertension   Congestive Heart Failure   Hyperlipidemia     History of Present Illness:    Hayden [name] is a 75 y.o. male with a hx of  ASCAD s/p MI in 1994 with PTCA of the LCx and then CABG in 9518, chronic diastolic CHF, HTN and dyslipidemia.    He is here today for followup and is doing well.  He denies any chest pain or pressure, SOB, DOE, PND, orthopnea, LE edema, dizziness, palpitations or syncope. He is compliant with his meds and is tolerating meds with no SE.    Past Medical History:  Diagnosis Date   Anxiety disorder    No details   Arthritis    Bright's disease age 2   Carotid artery stenosis    1-39% stenosis bilateraly by dopplers 2020   Chronic diastolic CHF (congestive heart failure) (HCC)    Coronary artery disease    MI in 1994 with PTCA of left circ and then coronary bypass 2000   Frequent urination    H/O hiatal hernia    Hyperlipidemia    statin intolerant   Hypertension    Hypogonadism male    No details available but receives replacement therapy   Impaired hearing    both ears   Kidney stones    100  in past plus stones   Myocardial infarction ([place]) 10-31-1989   Opiate addiction (HCC)    PONV (postoperative nausea and vomiting)    pt prefers epidural is possible   Pulmonary regurgitation    moderate by echo 10/2019    Past Surgical History:  Procedure Laterality Date   ANGIOPLASTY  1991   BILATERAL KNEE ARTHROSCOPY  yrs ago   CARDIAC CATHETERIZATION  01-17-07   CORONARY ANGIOPLASTY     CORONARY ARTERY BYPASS GRAFT  10-2998   cabg x 3   CYSTOSCOPY     10 to 12 times in past   [place]   with mesh   lithortripsy     x 3   LUMBAR LAMINECTOMY  L 3 to L 4   Jul 01 1995    NISSEN FUNDOPLICATION  8416   TOTAL KNEE ARTHROPLASTY  10/27/2011   Procedure: TOTAL KNEE ARTHROPLASTY;  Surgeon: Gearlean Alf, MD;  Location: [facility];  Service: Orthopedics;  Laterality: Right;    Current Medications: Current Meds  Medication Sig   amLODipine (NORVASC) 10 MG tablet TAKE 1 TABLET(10 MG) BY MOUTH DAILY   aspirin EC 81 MG tablet Take 81 mg by mouth daily.   bisoprolol (ZEBETA) 5 MG tablet Take 0.5 tablets (2.5 mg total) by mouth daily.   Cholecalciferol (VITAMIN D) 2000 UNITS tablet Take 2,000 Units by mouth daily.   Coenzyme Q10 (CO Q 10 PO) Take 1 tablet by mouth daily.   Evolocumab (REPATHA SURECLICK) 606 MG/ML SOAJ Inject 1 pen into the skin every 14 (fourteen) days.   Multiple Vitamin (MULTIVITAMIN WITH MINERALS) TABS Take 1 tablet by mouth daily.   nitroGLYCERIN (NITROSTAT) 0.4 MG SL tablet Place 1 tablet (0.4 mg total) under the tongue every 5 (five) minutes as needed. For chest pain.   omeprazole (PRILOSEC) 40 MG capsule  Take 40 mg by mouth daily.     Allergies:   Celecoxib, Penicillins, Statins, Sulfa antibiotics, and Sulfamethoxazole   Social History   Socioeconomic History   Marital status: Married    Spouse name: Not on file   Number of children: Not on file   Years of education: Not on file   Highest education level: Not on file  Occupational History   Occupation: Environmental manager: [employer]  Tobacco Use   Smoking status: Never   Smokeless tobacco: Never  Vaping Use   Vaping Use: Never used  Substance and Sexual Activity   Alcohol use: No    Comment: quit ETOH in 2013   Drug use: Yes    Comment: from [facility]-currently detoxing off opiates   Sexual activity: Not on file  Other Topics Concern   Not on file  Social History Narrative   Not on file   Social Determinants of Health   Financial Resource Strain: Not on file  Food Insecurity: Not on file  Transportation Needs: Not on file  Physical Activity: Not on file   Stress: Not on file  Social Connections: Not on file     Family History: The patient's family history includes Cancer - Lung in his father and mother; Heart attack in his father and mother.  ROS:   Please see the history of present illness.    ROS  All other systems reviewed and negative.   EKGs/Labs/Other Studies Reviewed:    The following studies were reviewed today: none  EKG:  EKG is ordered today and showed NSR with diffuse T wave abnormality in the inferolateral leads unchanged from 07/06/2020  Recent Labs: 06/24/2021: ALT 32   Recent Lipid Panel    Component Value Date/Time   CHOL 66 (L) 06/24/2021 0809   CHOL 121 06/28/2013 0809   TRIG 119 06/24/2021 0809   TRIG 106 06/28/2013 0809   HDL 27 (L) 06/24/2021 0809   HDL 37 (L) 06/28/2013 0809   CHOLHDL 2.4 06/24/2021 0809   CHOLHDL 7.2 (H) 07/13/2016 0805   VLDL 30 07/13/2016 0805   LDLCALC 17 06/24/2021 0809   LDLCALC 63 06/28/2013 0809    Physical Exam:    VS:  BP 120/60   Pulse (!) 58   Ht 5\' 8"  (1.727 m)   Wt 170 lb (77.1 kg)   SpO2 99%   BMI 25.85 kg/m     Wt Readings from Last 3 Encounters:  07/15/21 170 lb (77.1 kg)  07/16/20 165 lb (74.8 kg)  07/13/19 183 lb 3.2 oz (83.1 kg)     GEN: Well nourished, well developed in no acute distress HEENT: Normal NECK: No JVD; No carotid bruits LYMPHATICS: No lymphadenopathy CARDIAC:RRR, no murmurs, rubs, gallops RESPIRATORY:  Clear to auscultation without rales, wheezing or rhonchi  ABDOMEN: Soft, non-tender, non-distended MUSCULOSKELETAL:  No edema; No deformity  SKIN: Warm and dry NEUROLOGIC:  Alert and oriented x 3 PSYCHIATRIC:  Normal affect      ASSESSMENT:    1. Coronary artery disease involving native coronary artery of native heart without angina pectoris   2. Primary hypertension   3. Chronic diastolic CHF (congestive heart failure) ([place])   4. Pure hypercholesterolemia   5. Bilateral carotid artery stenosis    PLAN:    In order of  problems listed above:  1.  ASCAD -s/p MI in 1994 with PTCA of the LCx and then CABG in 2000 -He denies any anginal symptoms since I  saw him last -continue ASA 81mg  daily, BB  2.  HTN -BP is adequately controlled on exam today -Continue prescription drug management with amlodipine 10 mg daily and bisoprolol 2.5 mg daily with as needed refills   3.  Chronic diastolic CHF -He appears euvolemic on exam today -He denies any shortness of breath or lower extremity edema -He has not required any diuretics  4.  HLD -LDL goal is < 70 -I have personally reviewed and interpreted outside labs performed by patient's PCP which showed LDL 17, HDL 27, triglycerides 119, ALT 32 on 06/24/2021 -Continue prescription drug management with Repatha with as needed refills  5.  Bilateral carotid artery stenosis -dopplers 2020 with 1-39%  -Repeat carotid Dopplers since it is been 2 years -continue ASA and statin   Medication Adjustments/Labs and Tests Ordered: Current medicines are reviewed at length with the patient today.  Concerns regarding medicines are outlined above.  Orders Placed This Encounter  Procedures   EKG 12-Lead    No orders of the defined types were placed in this encounter.   Signed, Fransico Him, MD  07/15/2021 9:27 AM    [place]

## 2021-07-15 NOTE — Patient Instructions (Signed)
Medication Instructions:  Your physician recommends that you continue on your current medications as directed. Please refer to the Current Medication list given to you today.  *If you need a refill on your cardiac medications before your next appointment, please call your pharmacy*  Testing/Procedures: Your physician has requested that you have a carotid duplex. This test is an ultrasound of the carotid arteries in your neck. It looks at blood flow through these arteries that supply the brain with blood. Allow one hour for this exam. There are no restrictions or special instructions.  Follow-Up: At [facility], you and your health needs are our priority.  As part of our continuing mission to provide you with exceptional heart care, we have created designated Provider Care Teams.  These Care Teams include your primary Cardiologist (physician) and Advanced Practice Providers (APPs -  Physician Assistants and Nurse Practitioners) who all work together to provide you with the care you need, when you need it.   Your next appointment:   1 year(s)  The format for your next appointment:   In Person  Provider:   Fransico Him, MD

## 2021-08-04 DIAGNOSIS — M7551 Bursitis of right shoulder: Secondary | ICD-10-CM | POA: Diagnosis not present

## 2021-08-04 DIAGNOSIS — M25522 Pain in left elbow: Secondary | ICD-10-CM | POA: Diagnosis not present

## 2021-08-04 DIAGNOSIS — M13821 Other specified arthritis, right elbow: Secondary | ICD-10-CM | POA: Diagnosis not present

## 2021-08-04 DIAGNOSIS — M25512 Pain in left shoulder: Secondary | ICD-10-CM | POA: Diagnosis not present

## 2021-08-04 DIAGNOSIS — M7552 Bursitis of left shoulder: Secondary | ICD-10-CM | POA: Diagnosis not present

## 2021-08-04 DIAGNOSIS — M13822 Other specified arthritis, left elbow: Secondary | ICD-10-CM | POA: Diagnosis not present

## 2021-08-04 DIAGNOSIS — M25521 Pain in right elbow: Secondary | ICD-10-CM | POA: Diagnosis not present

## 2021-08-04 DIAGNOSIS — M25511 Pain in right shoulder: Secondary | ICD-10-CM | POA: Diagnosis not present

## 2021-08-10 ENCOUNTER — Ambulatory Visit (HOSPITAL_COMMUNITY)
Admission: RE | Admit: 2021-08-10 | Discharge: 2021-08-10 | Disposition: A | Discharge status: Home / Self Care | Payer: PPO | Source: Ambulatory Visit | Attending: Cardiology | Admitting: Cardiology

## 2021-08-10 ENCOUNTER — Other Ambulatory Visit: Payer: Self-pay

## 2021-08-10 DIAGNOSIS — E7800 Pure hypercholesterolemia, unspecified: Secondary | ICD-10-CM | POA: Diagnosis not present

## 2021-08-10 DIAGNOSIS — I6523 Occlusion and stenosis of bilateral carotid arteries: Secondary | ICD-10-CM | POA: Diagnosis not present

## 2021-08-10 DIAGNOSIS — I2510 Atherosclerotic heart disease of native coronary artery without angina pectoris: Secondary | ICD-10-CM | POA: Diagnosis not present

## 2021-08-10 DIAGNOSIS — I10 Essential (primary) hypertension: Secondary | ICD-10-CM | POA: Diagnosis not present

## 2021-08-10 DIAGNOSIS — I5032 Chronic diastolic (congestive) heart failure: Secondary | ICD-10-CM | POA: Diagnosis not present

## 2021-09-02 DIAGNOSIS — I10 Essential (primary) hypertension: Secondary | ICD-10-CM | POA: Diagnosis not present

## 2021-09-02 DIAGNOSIS — E785 Hyperlipidemia, unspecified: Secondary | ICD-10-CM | POA: Diagnosis not present

## 2021-09-02 DIAGNOSIS — E1169 Type 2 diabetes mellitus with other specified complication: Secondary | ICD-10-CM | POA: Diagnosis not present

## 2021-09-02 DIAGNOSIS — Z7984 Long term (current) use of oral hypoglycemic drugs: Secondary | ICD-10-CM | POA: Diagnosis not present

## 2021-09-02 DIAGNOSIS — I5032 Chronic diastolic (congestive) heart failure: Secondary | ICD-10-CM | POA: Diagnosis not present

## 2021-10-13 ENCOUNTER — Other Ambulatory Visit: Payer: Self-pay | Admitting: Physician Assistant

## 2021-10-13 DIAGNOSIS — M25521 Pain in right elbow: Secondary | ICD-10-CM

## 2021-11-11 DIAGNOSIS — R059 Cough, unspecified: Secondary | ICD-10-CM | POA: Diagnosis not present

## 2021-12-09 DIAGNOSIS — D485 Neoplasm of uncertain behavior of skin: Secondary | ICD-10-CM | POA: Diagnosis not present

## 2021-12-09 DIAGNOSIS — Z85828 Personal history of other malignant neoplasm of skin: Secondary | ICD-10-CM | POA: Diagnosis not present

## 2021-12-09 DIAGNOSIS — C4401 Basal cell carcinoma of skin of lip: Secondary | ICD-10-CM | POA: Diagnosis not present

## 2021-12-09 DIAGNOSIS — C44319 Basal cell carcinoma of skin of other parts of face: Secondary | ICD-10-CM | POA: Diagnosis not present

## 2021-12-09 DIAGNOSIS — L570 Actinic keratosis: Secondary | ICD-10-CM | POA: Diagnosis not present

## 2021-12-09 DIAGNOSIS — C44619 Basal cell carcinoma of skin of left upper limb, including shoulder: Secondary | ICD-10-CM | POA: Diagnosis not present

## 2021-12-09 DIAGNOSIS — L821 Other seborrheic keratosis: Secondary | ICD-10-CM | POA: Diagnosis not present

## 2021-12-09 DIAGNOSIS — C44519 Basal cell carcinoma of skin of other part of trunk: Secondary | ICD-10-CM | POA: Diagnosis not present

## 2021-12-22 DIAGNOSIS — E1165 Type 2 diabetes mellitus with hyperglycemia: Secondary | ICD-10-CM | POA: Diagnosis not present

## 2022-02-10 DIAGNOSIS — E1165 Type 2 diabetes mellitus with hyperglycemia: Secondary | ICD-10-CM | POA: Diagnosis not present

## 2022-02-16 ENCOUNTER — Telehealth: Payer: Self-pay | Admitting: Cardiology

## 2022-02-16 MED ORDER — EZETIMIBE 10 MG PO TABS: 10.0000 mg | ORAL_TABLET | Freq: Every day | ORAL | 3 refills | Status: DC

## 2022-02-16 NOTE — Telephone Encounter (Signed)
Called patient's pharmacy to learn more information about the cost. They stated that the patient paid $90 for an 84 day supply in April of this year, but it is running as $383 for his next fill this month. If he ran it as a 28 day supply it was still $135. They said that it usually says if it is due to a deductible or donut hole but it was not saying either for this, but that doesn't mean it isn't one of those things. Suspect it is the donut hole as he said the patient's Jardiance was $400 this month as well. We do not have Jardiance on the patient's medication list, so being on two branded medications means he is likely in the coverage gap this early in the year.   Called the patient who stated that he is in the donut hole and the cost of his next month's prescriptions altogether was $1100 which he cannot afford. He asks if there are alternatives that he can take because he needs to stop Repatha. He has multiple statin intolerances but not tried ezetimibe before and is willing to try that. Explained that it does not lower LDL as well as Repatha. He says that his plan is to restart Repatha in the new year but only take it once a month instead of every 2 weeks to stretch out his supply and costs. Considering his LDL dropped from 167 to 17 after starting Repatha, this dosing strategy would likely still provide good control of his LDL though it has not been studied or approved for this dosing. Patient stated he would also be stopping Jardiance (prescribed by his PCP) so there was no need to add it to his medication list at this time. Jinny Blossom will follow up with the patient in January 2024 to get him started back on Repatha.

## 2022-02-16 NOTE — Telephone Encounter (Signed)
Pt c/o medication issue:  1. Name of Medication:   Evolocumab (REPATHA SURECLICK) 528 MG/ML SOAJ    2. How are you currently taking this medication (dosage and times per day)? As written  3. Are you having a reaction (difficulty breathing--STAT)? no  4. What is your medication issue? Pt states that this medication has gotten too expensive and would like an alternative.

## 2022-03-10 DIAGNOSIS — G720 Drug-induced myopathy: Secondary | ICD-10-CM | POA: Diagnosis not present

## 2022-03-10 DIAGNOSIS — E1165 Type 2 diabetes mellitus with hyperglycemia: Secondary | ICD-10-CM | POA: Diagnosis not present

## 2022-03-10 DIAGNOSIS — E785 Hyperlipidemia, unspecified: Secondary | ICD-10-CM | POA: Diagnosis not present

## 2022-03-17 DIAGNOSIS — E119 Type 2 diabetes mellitus without complications: Secondary | ICD-10-CM | POA: Diagnosis not present

## 2022-03-17 DIAGNOSIS — H52203 Unspecified astigmatism, bilateral: Secondary | ICD-10-CM | POA: Diagnosis not present

## 2022-03-17 DIAGNOSIS — H43813 Vitreous degeneration, bilateral: Secondary | ICD-10-CM | POA: Diagnosis not present

## 2022-03-17 DIAGNOSIS — H25813 Combined forms of age-related cataract, bilateral: Secondary | ICD-10-CM | POA: Diagnosis not present

## 2022-06-23 DIAGNOSIS — L820 Inflamed seborrheic keratosis: Secondary | ICD-10-CM | POA: Diagnosis not present

## 2022-06-23 DIAGNOSIS — L821 Other seborrheic keratosis: Secondary | ICD-10-CM | POA: Diagnosis not present

## 2022-06-23 DIAGNOSIS — L570 Actinic keratosis: Secondary | ICD-10-CM | POA: Diagnosis not present

## 2022-06-23 DIAGNOSIS — Z85828 Personal history of other malignant neoplasm of skin: Secondary | ICD-10-CM | POA: Diagnosis not present

## 2022-06-23 DIAGNOSIS — D1801 Hemangioma of skin and subcutaneous tissue: Secondary | ICD-10-CM | POA: Diagnosis not present

## 2022-07-09 ENCOUNTER — Ambulatory Visit: Payer: PPO | Attending: Cardiology | Admitting: Cardiology

## 2022-07-09 ENCOUNTER — Encounter: Payer: Self-pay | Admitting: Cardiology

## 2022-07-09 VITALS — BP 134/76 | HR 52 | Ht 68.0 in | Wt 168.2 lb

## 2022-07-09 DIAGNOSIS — I6523 Occlusion and stenosis of bilateral carotid arteries: Secondary | ICD-10-CM | POA: Diagnosis not present

## 2022-07-09 DIAGNOSIS — I2510 Atherosclerotic heart disease of native coronary artery without angina pectoris: Secondary | ICD-10-CM

## 2022-07-09 DIAGNOSIS — E7800 Pure hypercholesterolemia, unspecified: Secondary | ICD-10-CM | POA: Diagnosis not present

## 2022-07-09 DIAGNOSIS — Z951 Presence of aortocoronary bypass graft: Secondary | ICD-10-CM | POA: Diagnosis not present

## 2022-07-09 DIAGNOSIS — I10 Essential (primary) hypertension: Secondary | ICD-10-CM | POA: Diagnosis not present

## 2022-07-09 DIAGNOSIS — Z1331 Encounter for screening for depression: Secondary | ICD-10-CM | POA: Diagnosis not present

## 2022-07-09 DIAGNOSIS — E785 Hyperlipidemia, unspecified: Secondary | ICD-10-CM | POA: Diagnosis not present

## 2022-07-09 DIAGNOSIS — I5032 Chronic diastolic (congestive) heart failure: Secondary | ICD-10-CM | POA: Diagnosis not present

## 2022-07-09 DIAGNOSIS — F1120 Opioid dependence, uncomplicated: Secondary | ICD-10-CM | POA: Diagnosis not present

## 2022-07-09 DIAGNOSIS — Z0000 Encounter for general adult medical examination without abnormal findings: Secondary | ICD-10-CM | POA: Diagnosis not present

## 2022-07-09 DIAGNOSIS — Z85820 Personal history of malignant melanoma of skin: Secondary | ICD-10-CM | POA: Diagnosis not present

## 2022-07-09 DIAGNOSIS — E1169 Type 2 diabetes mellitus with other specified complication: Secondary | ICD-10-CM | POA: Diagnosis not present

## 2022-07-09 NOTE — Progress Notes (Addendum)
Cardiology Office Note:    Date:  07/09/2022   ID:  Hayden [name], DOB 12/30/1945, MRN [id number]  PCP:  Donald Prose, MD  Cardiologist:  Fransico Him, MD    Referring MD: Donald Prose, MD   Chief Complaint  Patient presents with   Coronary Artery Disease   Hypertension   Congestive Heart Failure   Hyperlipidemia     History of Present Illness:    Hayden [name] is a 76 y.o. male with a hx of  ASCAD s/p MI in 1994 with PTCA of the LCx and then CABG in 5784, chronic diastolic CHF, HTN and dyslipidemia.    he is here today for followup and is doing well.  He denies any chest pain or pressure, but a few times he has walked with his wife and feels tightness in his chest but can workout at the gym with aerobic exercise and get HR to 150's with no chest discomfort.  The tightness only lasts a minute and is not what his angina in the past has felt like.  It is very infrequent. He denies any SOB, DOE, PND, orthopnea, LE edema, dizziness, palpitations or syncope. He is compliant with his meds and is tolerating meds with no SE.    Past Medical History:  Diagnosis Date   Anxiety disorder    No details   Arthritis    Bright's disease age 61   Carotid artery stenosis    1-39% stenosis bilateraly by dopplers 2020   Chronic diastolic CHF (congestive heart failure) (HCC)    Coronary artery disease    MI in 1994 with PTCA of left circ and then coronary bypass 2000   Frequent urination    H/O hiatal hernia    Hyperlipidemia    statin intolerant   Hypertension    Hypogonadism male    No details available but receives replacement therapy   Impaired hearing    both ears   Kidney stones    100  in past plus stones   Myocardial infarction ([place]) 10-31-1989   Opiate addiction (HCC)    PONV (postoperative nausea and vomiting)    pt prefers epidural is possible   Pulmonary regurgitation    moderate by echo 10/2019    Past Surgical History:  Procedure Laterality Date   ANGIOPLASTY  1991    BILATERAL KNEE ARTHROSCOPY  yrs ago   CARDIAC CATHETERIZATION  01-17-07   CORONARY ANGIOPLASTY     CORONARY ARTERY BYPASS GRAFT  10-2998   cabg x 3   CYSTOSCOPY     10 to 12 times in past   [place]   with mesh   lithortripsy     x 3   LUMBAR LAMINECTOMY  L 3 to L 4   Jul 01 1995   NISSEN FUNDOPLICATION  6962   TOTAL KNEE ARTHROPLASTY  10/27/2011   Procedure: TOTAL KNEE ARTHROPLASTY;  Surgeon: Gearlean Alf, MD;  Location: [facility];  Service: Orthopedics;  Laterality: Right;    Current Medications: Current Meds  Medication Sig   amLODipine (NORVASC) 10 MG tablet TAKE 1 TABLET(10 MG) BY MOUTH DAILY   aspirin EC 81 MG tablet Take 81 mg by mouth daily.   bisoprolol (ZEBETA) 5 MG tablet Take 0.5 tablets (2.5 mg total) by mouth daily.   Cholecalciferol (VITAMIN D) 2000 UNITS tablet Take 2,000 Units by mouth daily.   Coenzyme Q10 (CO Q 10 PO) Take 1 tablet by mouth daily.   ezetimibe (ZETIA)  10 MG tablet Take 1 tablet (10 mg total) by mouth daily.   metFORMIN (GLUCOPHAGE) 500 MG tablet Take 500 mg by mouth 2 (two) times daily with a meal.   Multiple Vitamin (MULTIVITAMIN WITH MINERALS) TABS Take 1 tablet by mouth daily.   nitroGLYCERIN (NITROSTAT) 0.4 MG SL tablet Place 1 tablet (0.4 mg total) under the tongue every 5 (five) minutes as needed. For chest pain.   omeprazole (PRILOSEC) 40 MG capsule Take 40 mg by mouth daily.     Allergies:   Celecoxib, Penicillins, Statins, Sulfa antibiotics, and Sulfamethoxazole   Social History   Socioeconomic History   Marital status: Married    Spouse name: Not on file   Number of children: Not on file   Years of education: Not on file   Highest education level: Not on file  Occupational History   Occupation: Environmental manager: [employer]  Tobacco Use   Smoking status: Never   Smokeless tobacco: Never  Vaping Use   Vaping Use: Never used  Substance and Sexual Activity   Alcohol use: No    Comment: quit ETOH in 2013    Drug use: Yes    Comment: from [facility]-currently detoxing off opiates   Sexual activity: Not on file  Other Topics Concern   Not on file  Social History Narrative   Not on file   Social Determinants of Health   Financial Resource Strain: Not on file  Food Insecurity: Not on file  Transportation Needs: Not on file  Physical Activity: Not on file  Stress: Not on file  Social Connections: Not on file     Family History: The patient's family history includes Cancer - Lung in his father and mother; Heart attack in his father and mother.  ROS:   Please see the history of present illness.    ROS  All other systems reviewed and negative.   EKGs/Labs/Other Studies Reviewed:    The following studies were reviewed today: none  EKG:  EKG is ordered today and showed sinus bradycardia at 52bpm with lateral ST abnormality  Recent Labs: No results found for requested labs within last 365 days.   Recent Lipid Panel    Component Value Date/Time   CHOL 66 (L) 06/24/2021 0809   CHOL 121 06/28/2013 0809   TRIG 119 06/24/2021 0809   TRIG 106 06/28/2013 0809   HDL 27 (L) 06/24/2021 0809   HDL 37 (L) 06/28/2013 0809   CHOLHDL 2.4 06/24/2021 0809   CHOLHDL 7.2 (H) 07/13/2016 0805   VLDL 30 07/13/2016 0805   LDLCALC 17 06/24/2021 0809   LDLCALC 63 06/28/2013 0809    Physical Exam:    VS:  BP 134/76   Pulse (!) 52   Ht '5\' 8"'$  (1.727 m)   Wt 168 lb 3.2 oz (76.3 kg)   SpO2 97%   BMI 25.57 kg/m     Wt Readings from Last 3 Encounters:  07/09/22 168 lb 3.2 oz (76.3 kg)  07/15/21 170 lb (77.1 kg)  07/16/20 165 lb (74.8 kg)     GEN: Well nourished, well developed in no acute distress HEENT: Normal NECK: No JVD; No carotid bruits LYMPHATICS: No lymphadenopathy CARDIAC:RRR, no murmurs, rubs, gallops RESPIRATORY:  Clear to auscultation without rales, wheezing or rhonchi  ABDOMEN: Soft, non-tender, non-distended MUSCULOSKELETAL:  No edema; No deformity  SKIN: Warm and  dry NEUROLOGIC:  Alert and oriented x 3 PSYCHIATRIC:  Normal affect    ASSESSMENT:  1. Coronary artery disease involving native coronary artery of native heart without angina pectoris   2. Primary hypertension   3. Chronic diastolic CHF (congestive heart failure) ([place])   4. Pure hypercholesterolemia   5. Bilateral carotid artery stenosis    PLAN:    In order of problems listed above:  1.  ASCAD -s/p MI in 1994 with PTCA of the LCx and then CABG in 2000 -He denies any typical anginal symptoms and works out on the treadmill at the gym getting his HR into the 150's with no symptoms but has had 1 or 2 episodes of tightness in chest lasting less than a minute only when he walks with his wife and it has only occurred 2 times in the past year and is nothing like what his angina was in the past -Continue prescription drug management with aspirin 81 mg daily, Zebeta 2.5 mg daily with as needed refills -I instructed him to let me know if he develops any of his typical anginal sx or if he has anymore episode of the tightness>>if he has further episodes will get a Stress PET CT.  2.  HTN -BP is controlled on exam today -Continue prescription drug with amlodipine 10 mg daily, Zebeta 5 mg 1/2 tablet daily with as needed refills  3.  Chronic diastolic CHF -He does not appear volume loaded on exam today and denies any shortness of breath or lower extremity edema -He has not required any diuretics  4.  HLD -LDL goal is < 70 -I have personally reviewed and interpreted outside labs performed by patient's PCP which showed LDL 17 and HDL 27 on 06/24/2021 -He has not taken his Repatha for the past 5 months because he is in the donut hole and says he can only take it 6 months out of the year because of cost or only take it once a month -I am going to get the Pharm.D. to talk to him today about what her options are -He is statin intolerant  5.  Bilateral carotid artery stenosis -dopplers 07/2021 with  1-39% bilateral stenosis -Repeat carotid Dopplers 07/2023 -continue ASA and Repatha   Medication Adjustments/Labs and Tests Ordered: Current medicines are reviewed at length with the patient today.  Concerns regarding medicines are outlined above.  No orders of the defined types were placed in this encounter.   No orders of the defined types were placed in this encounter.   Signed, Fransico Him, MD  07/09/2022 11:31 AM    [place]

## 2022-07-09 NOTE — Patient Instructions (Signed)
Medication Instructions:  Your physician recommends that you continue on your current medications as directed. Please refer to the Current Medication list given to you today.  *If you need a refill on your cardiac medications before your next appointment, please call your pharmacy*  Testing/Procedures: Your physician has requested that you have a carotid duplex in December 2024. This test is an ultrasound of the carotid arteries in your neck. It looks at blood flow through these arteries that supply the brain with blood. Allow one hour for this exam. There are no restrictions or special instructions.  Follow-Up: At [facility], you and your health needs are our priority.  As part of our continuing mission to provide you with exceptional heart care, we have created designated Provider Care Teams.  These Care Teams include your primary Cardiologist (physician) and Advanced Practice Providers (APPs -  Physician Assistants and Nurse Practitioners) who all work together to provide you with the care you need, when you need it.  Your next appointment:   1 year(s)  The format for your next appointment:   In Person  Provider:   Fransico Him, MD      Important Information About Sugar

## 2022-07-12 ENCOUNTER — Encounter: Payer: Self-pay | Admitting: Pharmacist

## 2022-07-12 NOTE — Addendum Note (Signed)
Addended by: Juventino Slovak on: 07/12/2022 05:20 PM   Modules accepted: Orders

## 2022-07-14 MED ORDER — REPATHA SURECLICK 140 MG/ML ~~LOC~~ SOAJ: 1.0000 mL | SUBCUTANEOUS | 11 refills | Status: DC

## 2022-07-16 ENCOUNTER — Other Ambulatory Visit: Payer: Self-pay | Admitting: Cardiology

## 2022-07-21 DIAGNOSIS — Z86010 Personal history of colonic polyps: Secondary | ICD-10-CM | POA: Diagnosis not present

## 2022-07-28 DIAGNOSIS — M13821 Other specified arthritis, right elbow: Secondary | ICD-10-CM | POA: Diagnosis not present

## 2022-07-28 DIAGNOSIS — M7021 Olecranon bursitis, right elbow: Secondary | ICD-10-CM | POA: Diagnosis not present

## 2022-07-28 DIAGNOSIS — M25522 Pain in left elbow: Secondary | ICD-10-CM | POA: Diagnosis not present

## 2022-07-28 DIAGNOSIS — M25521 Pain in right elbow: Secondary | ICD-10-CM | POA: Diagnosis not present

## 2022-08-16 DIAGNOSIS — M25521 Pain in right elbow: Secondary | ICD-10-CM | POA: Diagnosis not present

## 2022-09-01 ENCOUNTER — Other Ambulatory Visit (HOSPITAL_COMMUNITY): Payer: Self-pay

## 2022-09-01 DIAGNOSIS — D122 Benign neoplasm of ascending colon: Secondary | ICD-10-CM | POA: Diagnosis not present

## 2022-09-01 DIAGNOSIS — D123 Benign neoplasm of transverse colon: Secondary | ICD-10-CM | POA: Diagnosis not present

## 2022-09-01 DIAGNOSIS — K5730 Diverticulosis of large intestine without perforation or abscess without bleeding: Secondary | ICD-10-CM | POA: Diagnosis not present

## 2022-09-01 DIAGNOSIS — Z09 Encounter for follow-up examination after completed treatment for conditions other than malignant neoplasm: Secondary | ICD-10-CM | POA: Diagnosis not present

## 2022-09-01 DIAGNOSIS — Z86010 Personal history of colonic polyps: Secondary | ICD-10-CM | POA: Diagnosis not present

## 2022-09-01 DIAGNOSIS — K648 Other hemorrhoids: Secondary | ICD-10-CM | POA: Diagnosis not present

## 2022-09-02 ENCOUNTER — Other Ambulatory Visit (HOSPITAL_COMMUNITY): Payer: Self-pay

## 2022-09-06 DIAGNOSIS — D123 Benign neoplasm of transverse colon: Secondary | ICD-10-CM | POA: Diagnosis not present

## 2022-09-06 DIAGNOSIS — D122 Benign neoplasm of ascending colon: Secondary | ICD-10-CM | POA: Diagnosis not present

## 2022-10-08 ENCOUNTER — Other Ambulatory Visit: Payer: Self-pay | Admitting: Physician Assistant

## 2022-10-08 DIAGNOSIS — M25521 Pain in right elbow: Secondary | ICD-10-CM

## 2022-11-17 DIAGNOSIS — Z7984 Long term (current) use of oral hypoglycemic drugs: Secondary | ICD-10-CM | POA: Diagnosis not present

## 2022-11-17 DIAGNOSIS — I2510 Atherosclerotic heart disease of native coronary artery without angina pectoris: Secondary | ICD-10-CM | POA: Diagnosis not present

## 2022-11-17 DIAGNOSIS — E1151 Type 2 diabetes mellitus with diabetic peripheral angiopathy without gangrene: Secondary | ICD-10-CM | POA: Diagnosis not present

## 2022-11-17 DIAGNOSIS — N181 Chronic kidney disease, stage 1: Secondary | ICD-10-CM | POA: Diagnosis not present

## 2022-11-17 DIAGNOSIS — E1122 Type 2 diabetes mellitus with diabetic chronic kidney disease: Secondary | ICD-10-CM | POA: Diagnosis not present

## 2022-12-01 DIAGNOSIS — Z79899 Other long term (current) drug therapy: Secondary | ICD-10-CM | POA: Diagnosis not present

## 2022-12-01 DIAGNOSIS — E1122 Type 2 diabetes mellitus with diabetic chronic kidney disease: Secondary | ICD-10-CM | POA: Diagnosis not present

## 2022-12-01 DIAGNOSIS — E1151 Type 2 diabetes mellitus with diabetic peripheral angiopathy without gangrene: Secondary | ICD-10-CM | POA: Diagnosis not present

## 2022-12-01 DIAGNOSIS — Z7984 Long term (current) use of oral hypoglycemic drugs: Secondary | ICD-10-CM | POA: Diagnosis not present

## 2022-12-08 ENCOUNTER — Telehealth: Payer: Self-pay

## 2022-12-08 DIAGNOSIS — E7800 Pure hypercholesterolemia, unspecified: Secondary | ICD-10-CM

## 2022-12-08 NOTE — Telephone Encounter (Signed)
Called patient to scheduled follow up labs. Orders placed, labs scheduled.

## 2022-12-08 NOTE — Telephone Encounter (Signed)
-----   Message from Quintella Reichert, MD sent at 12/07/2022 12:52 PM EDT ----- Please find out if patient has missed any doses Repatha as LDL is not at goal

## 2022-12-08 NOTE — Telephone Encounter (Signed)
Called to discuss lab results with patient. Per Dr. Mayford Knife, asked if patient has missed any repatha doses. He states that someone had previously told him that he could take repatha only once a month because his lab numbers were so good. He does not remember who told him this, but he states that when he saw his LDL was still high he decided to go back to twice a month dosing. He has already taken one dose this month and plans to take another dose on 12/13/22. Forwarded patient's responses to Dr. Mayford Knife.

## 2022-12-08 NOTE — Addendum Note (Signed)
Addended by: Luellen Pucker on: 12/08/2022 05:17 PM   Modules accepted: Orders

## 2022-12-27 DIAGNOSIS — Z85828 Personal history of other malignant neoplasm of skin: Secondary | ICD-10-CM | POA: Diagnosis not present

## 2022-12-27 DIAGNOSIS — C44729 Squamous cell carcinoma of skin of left lower limb, including hip: Secondary | ICD-10-CM | POA: Diagnosis not present

## 2022-12-27 DIAGNOSIS — L570 Actinic keratosis: Secondary | ICD-10-CM | POA: Diagnosis not present

## 2022-12-27 DIAGNOSIS — D485 Neoplasm of uncertain behavior of skin: Secondary | ICD-10-CM | POA: Diagnosis not present

## 2023-02-02 ENCOUNTER — Ambulatory Visit: Payer: PPO | Attending: Cardiology

## 2023-02-02 DIAGNOSIS — E7800 Pure hypercholesterolemia, unspecified: Secondary | ICD-10-CM | POA: Diagnosis not present

## 2023-02-02 LAB — LIPID PANEL
Chol/HDL Ratio: 2.5 ratio (ref 0.0–5.0)
Cholesterol, Total: 79 mg/dL — ABNORMAL LOW (ref 100–199)
HDL: 31 mg/dL — ABNORMAL LOW (ref >39)
LDL Chol Calc (NIH): 18 mg/dL (ref 0–99)
Triglycerides: 188 mg/dL — ABNORMAL HIGH (ref 0–149)
VLDL Cholesterol Cal: 30 mg/dL (ref 5–40)

## 2023-02-02 LAB — ALT: ALT: 32 IU/L (ref 0–44)

## 2023-02-03 ENCOUNTER — Telehealth: Payer: Self-pay

## 2023-02-03 NOTE — Telephone Encounter (Signed)
Called to discuss labs with patient and whether he was fasting. No answer, left message per DPR asking patient to call back.

## 2023-02-03 NOTE — Telephone Encounter (Signed)
-----   Message from Loa Socks, LPN sent at 4/0/9811  7:13 AM EDT -----  ----- Message ----- From: Quintella Reichert, MD Sent: 02/02/2023   8:34 PM EDT To: [facility] Triage  Was this a fasting lab

## 2023-02-04 ENCOUNTER — Telehealth: Payer: Self-pay

## 2023-02-04 DIAGNOSIS — E7800 Pure hypercholesterolemia, unspecified: Secondary | ICD-10-CM

## 2023-02-04 NOTE — Telephone Encounter (Signed)
Patient is returning call to discuss lab results. 

## 2023-02-04 NOTE — Telephone Encounter (Signed)
-----   Message from Ivy M Martin, LPN sent at 02/03/2023  7:13 AM EDT -----  ----- Message ----- From: Turner, Traci R, MD Sent: 02/02/2023   8:34 PM EDT To: [facility] Triage  Was this a fasting lab  

## 2023-02-04 NOTE — Telephone Encounter (Signed)
Called and spoke to patient regarding recent lipid panel. He states he was fasting for these labs and was surprised that his triglycerides were elevated. He states he has been taking his repatha as ordered. Forewarded to Dr. Mayford Knife.

## 2023-02-09 MED ORDER — ICOSAPENT ETHYL 1 G PO CAPS: 2.0000 g | ORAL_CAPSULE | Freq: Two times a day (BID) | ORAL | 3 refills | Status: DC

## 2023-02-09 NOTE — Telephone Encounter (Signed)
Per Dr. Mayford Knife add Vascepa 2gm BID and cut out sugars and carbs and repeat FLP in 3 months   Called patient with Dr. Norris Cross advisement. Patient verbalized understanding. Patient will get lab work done on 05/18/23.

## 2023-02-09 NOTE — Addendum Note (Signed)
Addended by: Virl Axe,  L on: 02/09/2023 09:20 AM   Modules accepted: Orders

## 2023-03-21 DIAGNOSIS — E119 Type 2 diabetes mellitus without complications: Secondary | ICD-10-CM | POA: Diagnosis not present

## 2023-03-21 DIAGNOSIS — H2513 Age-related nuclear cataract, bilateral: Secondary | ICD-10-CM | POA: Diagnosis not present

## 2023-03-21 DIAGNOSIS — H52203 Unspecified astigmatism, bilateral: Secondary | ICD-10-CM | POA: Diagnosis not present

## 2023-03-30 ENCOUNTER — Telehealth: Payer: Self-pay | Admitting: Cardiology

## 2023-03-30 NOTE — Telephone Encounter (Signed)
Called patient and informed him of pharm D advisement. Patient stated he would not be able to lower the dose, since he has just been taking Vascepa 1 gram daily. Patient will stop and see if stopping medication will help. Will let Dr. Mayford Knife and her nurse know.

## 2023-03-30 NOTE — Telephone Encounter (Signed)
Ok to hold his Vascepa to see if cramps resolve, and if they do, retry at lower dose of 1g BID. His TG were only mildly elevated on recent labs. Not sure which med he is referring to, there is a drug called Levsin but this is a gut antispasmotic and not appropriate to prescribe.

## 2023-03-30 NOTE — Telephone Encounter (Signed)
Pt c/o medication issue:  1. Name of Medication:   icosapent Ethyl (VASCEPA) 1 g capsule    2. How are you currently taking this medication (dosage and times per day)? Take 2 capsules (2 g total) by mouth 2 (two) times daily.   3. Are you having a reaction (difficulty breathing--STAT)? No  4. What is your medication issue? Pt states that mediation is causing him to have sever cramps. Pt says that he would like to take Ledsin but is not sold OTC. He would like a callback regarding this matter. Please advise

## 2023-05-06 DIAGNOSIS — H6002 Abscess of left external ear: Secondary | ICD-10-CM | POA: Diagnosis not present

## 2023-05-15 ENCOUNTER — Other Ambulatory Visit: Payer: Self-pay | Admitting: Cardiology

## 2023-05-18 ENCOUNTER — Ambulatory Visit: Payer: PPO | Attending: Cardiology

## 2023-05-18 DIAGNOSIS — N181 Chronic kidney disease, stage 1: Secondary | ICD-10-CM | POA: Diagnosis not present

## 2023-05-18 DIAGNOSIS — R5383 Other fatigue: Secondary | ICD-10-CM | POA: Diagnosis not present

## 2023-05-18 DIAGNOSIS — E1151 Type 2 diabetes mellitus with diabetic peripheral angiopathy without gangrene: Secondary | ICD-10-CM | POA: Diagnosis not present

## 2023-05-18 DIAGNOSIS — E1122 Type 2 diabetes mellitus with diabetic chronic kidney disease: Secondary | ICD-10-CM | POA: Diagnosis not present

## 2023-05-18 DIAGNOSIS — E7800 Pure hypercholesterolemia, unspecified: Secondary | ICD-10-CM | POA: Diagnosis not present

## 2023-05-18 DIAGNOSIS — I2510 Atherosclerotic heart disease of native coronary artery without angina pectoris: Secondary | ICD-10-CM | POA: Diagnosis not present

## 2023-05-18 LAB — LIPID PANEL
Chol/HDL Ratio: 2.5 ratio (ref 0.0–5.0)
Cholesterol, Total: 87 mg/dL — ABNORMAL LOW (ref 100–199)
HDL: 35 mg/dL — ABNORMAL LOW (ref >39)
LDL Chol Calc (NIH): 24 mg/dL (ref 0–99)
Triglycerides: 167 mg/dL — ABNORMAL HIGH (ref 0–149)
VLDL Cholesterol Cal: 28 mg/dL (ref 5–40)

## 2023-05-19 ENCOUNTER — Encounter: Payer: Self-pay | Admitting: Cardiology

## 2023-06-27 ENCOUNTER — Encounter: Payer: Self-pay | Admitting: Cardiology

## 2023-06-27 ENCOUNTER — Ambulatory Visit: Payer: PPO | Attending: Cardiology | Admitting: Cardiology

## 2023-06-27 VITALS — BP 124/70 | HR 61 | Ht 68.0 in | Wt 165.0 lb

## 2023-06-27 DIAGNOSIS — I10 Essential (primary) hypertension: Secondary | ICD-10-CM | POA: Diagnosis not present

## 2023-06-27 DIAGNOSIS — R9431 Abnormal electrocardiogram [ECG] [EKG]: Secondary | ICD-10-CM

## 2023-06-27 DIAGNOSIS — E7800 Pure hypercholesterolemia, unspecified: Secondary | ICD-10-CM | POA: Diagnosis not present

## 2023-06-27 DIAGNOSIS — I2510 Atherosclerotic heart disease of native coronary artery without angina pectoris: Secondary | ICD-10-CM | POA: Diagnosis not present

## 2023-06-27 DIAGNOSIS — I6523 Occlusion and stenosis of bilateral carotid arteries: Secondary | ICD-10-CM | POA: Diagnosis not present

## 2023-06-27 DIAGNOSIS — I5032 Chronic diastolic (congestive) heart failure: Secondary | ICD-10-CM | POA: Diagnosis not present

## 2023-06-27 DIAGNOSIS — M542 Cervicalgia: Secondary | ICD-10-CM | POA: Diagnosis not present

## 2023-06-27 NOTE — Addendum Note (Signed)
Addended by: Rexene Edison L on: 06/27/2023 10:14 AM   Modules accepted: Orders

## 2023-06-27 NOTE — Progress Notes (Signed)
Cardiology Office Note:    Date:  06/27/2023   ID:  Hayden [name], DOB 1946/05/06, MRN [id number]  PCP:  Deatra James, MD  Cardiologist:  Armanda Magic, MD    Referring MD: Deatra James, MD   Chief Complaint  Patient presents with   Coronary Artery Disease   Hypertension   Congestive Heart Failure   Hyperlipidemia     History of Present Illness:    Hayden [name] is a 77 y.o. male with a hx of  ASCAD s/p MI in 1994 with PTCA of the LCx and then CABG in 2000, chronic diastolic CHF, HTN and dyslipidemia.    He is here today for followup and is doing well.  HE denies any chest pain or pressure, SOB, DOE, PND, orthopnea, LE edema, dizziness, palpitations or syncope. He is compliant with his meds and is tolerating meds with no SE.  He goes to the gym several times a week and also walks for exercise.  He gets 7500 steps daily.   Past Medical History:  Diagnosis Date   Anxiety disorder    No details   Arthritis    Bright's disease age 40   Carotid artery stenosis    1-39% stenosis bilateraly by dopplers 2020   Chronic diastolic CHF (congestive heart failure) (HCC)    Coronary artery disease    MI in 1994 with PTCA of left circ and then coronary bypass 2000   Frequent urination    H/O hiatal hernia    Hyperlipidemia    statin intolerant   Hypertension    Hypogonadism male    No details available but receives replacement therapy   Impaired hearing    both ears   Kidney stones    100  in past plus stones   Myocardial infarction (HCC) 10-31-1989   Opiate addiction (HCC)    PONV (postoperative nausea and vomiting)    pt prefers epidural is possible   Pulmonary regurgitation    moderate by echo 10/2019    Past Surgical History:  Procedure Laterality Date   ANGIOPLASTY  1991   BILATERAL KNEE ARTHROSCOPY  yrs ago   CARDIAC CATHETERIZATION  01-17-07   CORONARY ANGIOPLASTY     CORONARY ARTERY BYPASS GRAFT  [id number]   cabg x 3   CYSTOSCOPY     10 to 12 times in past   HERNIA  REPAIR  1998   with mesh   lithortripsy     x 3   LUMBAR LAMINECTOMY  L 3 to L 4   Jul 01 1995   NISSEN FUNDOPLICATION  2006   TOTAL KNEE ARTHROPLASTY  10/27/2011   Procedure: TOTAL KNEE ARTHROPLASTY;  Surgeon: [name], MD;  Location: [facility];  Service: Orthopedics;  Laterality: Right;    Current Medications: Current Meds  Medication Sig   amLODipine (NORVASC) 10 MG tablet TAKE 1 TABLET(10 MG) BY MOUTH DAILY   aspirin EC 81 MG tablet Take 81 mg by mouth daily.   bisoprolol (ZEBETA) 5 MG tablet TAKE 1/2 TABLET(2.5 MG) BY MOUTH DAILY   Cholecalciferol (VITAMIN D) 2000 UNITS tablet Take 2,000 Units by mouth daily.   Coenzyme Q10 (CO Q 10 PO) Take 1 tablet by mouth daily.   ezetimibe (ZETIA) 10 MG tablet Take 1 tablet (10 mg total) by mouth daily.   metFORMIN (GLUCOPHAGE) 500 MG tablet Take 500 mg by mouth 2 (two) times daily with a meal.   Multiple Vitamin (MULTIVITAMIN WITH MINERALS) TABS Take 1 tablet by  mouth daily.   omeprazole (PRILOSEC) 40 MG capsule Take 40 mg by mouth daily.   REPATHA SURECLICK 140 MG/ML SOAJ ADMINISTER 1 ML UNDER THE SKIN EVERY 14 DAYS   [DISCONTINUED] icosapent Ethyl (VASCEPA) 1 g capsule Take 2 capsules (2 g total) by mouth 2 (two) times daily.   [DISCONTINUED] nitroGLYCERIN (NITROSTAT) 0.4 MG SL tablet Place 1 tablet (0.4 mg total) under the tongue every 5 (five) minutes as needed. For chest pain.     Allergies:   Celecoxib, Penicillins, Statins, Sulfa antibiotics, and Sulfamethoxazole   Social History   Socioeconomic History   Marital status: Married    Spouse name: Not on file   Number of children: Not on file   Years of education: Not on file   Highest education level: Not on file  Occupational History   Occupation: Publishing rights manager: [employer]  Tobacco Use   Smoking status: Never   Smokeless tobacco: Never  Vaping Use   Vaping status: Never Used  Substance and Sexual Activity   Alcohol use: No    Comment: quit ETOH in 2013    Drug use: Yes    Comment: from [facility]-currently detoxing off opiates   Sexual activity: Not on file  Other Topics Concern   Not on file  Social History Narrative   Not on file   Social Determinants of Health   Financial Resource Strain: Not on file  Food Insecurity: Not on file  Transportation Needs: Not on file  Physical Activity: Not on file  Stress: Not on file  Social Connections: Not on file     Family History: The patient's family history includes Cancer - Lung in his father and mother; Heart attack in his father and mother.  ROS:   Please see the history of present illness.    ROS  All other systems reviewed and negative.   EKGs/Labs/Other Studies Reviewed:    The following studies were reviewed today: EKG Interpretation Date/Time:  Monday June 27 2023 09:46:24 EDT Ventricular Rate:  61 PR Interval:  186 QRS Duration:  88 QT Interval:  394 QTC Calculation: 396 R Axis:   50  Text Interpretation: Normal sinus rhythm ST & T wave abnormality, consider anterolateral ischemia When compared with ECG of 06-Sep-2012 06:50, Premature ventricular complexes are no longer Present T wave inversion now evident in Anterolateral leads QT has shortened Confirmed by Armanda Magic ([id number]) on 06/27/2023 10:01:07 AM     Recent Labs: 02/02/2023: ALT 32   Recent Lipid Panel    Component Value Date/Time   CHOL 87 (L) 05/18/2023 0719   CHOL 121 06/28/2013 0809   TRIG 167 (H) 05/18/2023 0719   TRIG 106 06/28/2013 0809   HDL 35 (L) 05/18/2023 0719   HDL 37 (L) 06/28/2013 0809   CHOLHDL 2.5 05/18/2023 0719   CHOLHDL 7.2 (H) 07/13/2016 0805   VLDL 30 07/13/2016 0805   LDLCALC 24 05/18/2023 0719   LDLCALC 63 06/28/2013 0809    Physical Exam:    VS:  BP 124/70   Pulse 61   Ht 5\' 8"  (1.727 m)   Wt 165 lb (74.8 kg)   SpO2 97%   BMI 25.09 kg/m     Wt Readings from Last 3 Encounters:  06/27/23 165 lb (74.8 kg)  07/09/22 168 lb 3.2 oz (76.3 kg)  07/15/21 170 lb  (77.1 kg)    GEN: Well nourished, well developed in no acute distress HEENT: Normal NECK: No JVD; No carotid  bruits LYMPHATICS: No lymphadenopathy CARDIAC:RRR, no murmurs, rubs, gallops RESPIRATORY:  Clear to auscultation without rales, wheezing or rhonchi  ABDOMEN: Soft, non-tender, non-distended MUSCULOSKELETAL:  No edema; No deformity  SKIN: Warm and dry NEUROLOGIC:  Alert and oriented x 3 PSYCHIATRIC:  Normal affect  ASSESSMENT:    1. Coronary artery disease involving native coronary artery of native heart without angina pectoris   2. Primary hypertension   3. Chronic diastolic CHF (congestive heart failure) (HCC)   4. Pure hypercholesterolemia   5. Bilateral carotid artery stenosis     PLAN:    In order of problems listed above:  1.  ASCAD -s/p MI in 1994 with PTCA of the LCx and then CABG in 2000 -He denies any anginal symptoms since I saw him last -Continue prescription drug managed with aspirin 81 mg daily, bisoprolol 2.5 mg daily and Repatha/Zetia 10 mg daily -He is statin intolerant -He has new anterior T wave changes on EKG and despite being asymptomatic I have recommended a stress PET CT -Informed Consent   Shared Decision Making/Informed Consent The risks [chest pain, shortness of breath, cardiac arrhythmias, dizziness, blood pressure fluctuations, myocardial infarction, stroke/transient ischemic attack, nausea, vomiting, allergic reaction, radiation exposure, metallic taste sensation and life-threatening complications (estimated to be 1 in 10,000)], benefits (risk stratification, diagnosing coronary artery disease, treatment guidance) and alternatives of a cardiac PET stress test were discussed in detail with Mr. Mosqueda and he agrees to proceed.  2.  HTN -BP controlled on exam today -Continue drug managed with amlodipine 10 mg daily, bisoprolol 2.5 mg daily with as needed refills  3.  Chronic diastolic CHF -He appears euvolemic on exam today -He has not required  any diuretics  4.  HLD -LDL goal is < 70 -I have personally reviewed and interpreted outside labs performed by patient's PCP which showed LDL 24, HDL 35, triglycerides 167 -Continue prescription drug management with Repatha, Zetia 10 mg daily and Vascepa 2 g twice daily with as needed refills  5.  Bilateral carotid artery stenosis -dopplers 07/2021 with 1-39% bilateral stenosis -Repeat carotid Dopplers 07/2023 -Continue ASA and Repatha   Medication Adjustments/Labs and Tests Ordered: Current medicines are reviewed at length with the patient today.  Concerns regarding medicines are outlined above.  Orders Placed This Encounter  Procedures   EKG 12-Lead    No orders of the defined types were placed in this encounter.   Signed, Armanda Magic, MD  06/27/2023 9:59 AM    [facility]

## 2023-06-27 NOTE — Patient Instructions (Signed)
Medication Instructions:  Your physician recommends that you continue on your current medications as directed. Please refer to the Current Medication list given to you today.  *If you need a refill on your cardiac medications before your next appointment, please call your pharmacy*   Lab Work: None.  If you have labs (blood work) drawn today and your tests are completely normal, you will receive your results only by: MyChart Message (if you have MyChart) OR A paper copy in the mail If you have any lab test that is abnormal or we need to change your treatment, we will call you to review the results.   Testing/Procedures: Your physician has requested that you have a carotid duplex. This test is an ultrasound of the carotid arteries in your neck. It looks at blood flow through these arteries that supply the brain with blood. Allow one hour for this exam. There are no restrictions or special instructions.  How to Prepare for Your Cardiac PET/CT Stress Test:  1. Please do not take these medications before your test:   Medications that may interfere with the cardiac pharmacological stress agent (ex. nitrates - including erectile dysfunction medications, isosorbide mononitrate, tamulosin or beta-blockers) the day of the exam. (Erectile dysfunction medication should be held for at least 72 hrs prior to test) Theophylline containing medications for 12 hours. Dipyridamole 48 hours prior to the test. Your remaining medications may be taken with water.  2. Nothing to eat or drink, except water, 3 hours prior to arrival time.   NO caffeine/decaffeinated products, or chocolate 12 hours prior to arrival.  3. NO perfume, cologne or lotion on chest or abdomen area.          - FEMALES - Please avoid wearing dresses to this appointment.  4. Total time is 1 to 2 hours; you may want to bring reading material for the waiting time.  5. Please report to Radiology at the [facility] Main Entrance  30 minutes early for your test.  [address] [place], [place] [address]    Diabetic Preparation:  Hold oral medications. You may take NPH and Lantus insulin. Do not take Humalog or Humulin R (Regular Insulin) the day of your test. Check blood sugars prior to leaving the house. If able to eat breakfast prior to 3 hour fasting, you may take all medications, including your insulin, Do not worry if you miss your breakfast dose of insulin - start at your next meal. Patients who wear a continuous glucose monitor MUST remove the device prior to scanning.  IF YOU THINK YOU MAY BE PREGNANT, OR ARE NURSING PLEASE INFORM THE TECHNOLOGIST.  In preparation for your appointment, medication and supplies will be purchased.  Appointment availability is limited, so if you need to cancel or reschedule, please call the Radiology Department at [phone] [facility]) [place] [phone] [facility])  24 hours in advance to avoid a cancellation fee of $100.00  What to Expect After you Arrive:  Once you arrive and check in for your appointment, you will be taken to a preparation room within the Radiology Department.  A technologist or Nurse will obtain your medical history, verify that you are correctly prepped for the exam, and explain the procedure.  Afterwards,  an IV will be started in your arm and electrodes will be placed on your skin for EKG monitoring during the stress portion of the exam. Then you will be escorted to the PET/CT scanner.  There, staff will get you positioned  on the scanner and obtain a blood pressure and EKG.  During the exam, you will continue to be connected to the EKG and blood pressure machines.  A small, safe amount of a radioactive tracer will be injected in your IV to obtain a series of pictures of your heart along with an injection of a stress agent.    After your Exam:  It is recommended that you eat a meal and drink a caffeinated beverage to counter act any effects of the stress  agent.  Drink plenty of fluids for the remainder of the day and urinate frequently for the first couple of hours after the exam.  Your doctor will inform you of your test results within 7-10 business days.  For more information and frequently asked questions, please visit our website : [email or web address]  For questions about your test or how to prepare for your test, please call: Cardiac Imaging Nurse Navigators Office: [phone]    Follow-Up:  Your next appointment:   1 year(s)  Provider:   Armanda Magic, MD

## 2023-06-29 ENCOUNTER — Other Ambulatory Visit: Payer: Self-pay | Admitting: Cardiology

## 2023-06-29 DIAGNOSIS — C44319 Basal cell carcinoma of skin of other parts of face: Secondary | ICD-10-CM | POA: Diagnosis not present

## 2023-06-29 DIAGNOSIS — Z85828 Personal history of other malignant neoplasm of skin: Secondary | ICD-10-CM | POA: Diagnosis not present

## 2023-06-29 DIAGNOSIS — D485 Neoplasm of uncertain behavior of skin: Secondary | ICD-10-CM | POA: Diagnosis not present

## 2023-06-29 DIAGNOSIS — L821 Other seborrheic keratosis: Secondary | ICD-10-CM | POA: Diagnosis not present

## 2023-06-29 DIAGNOSIS — L814 Other melanin hyperpigmentation: Secondary | ICD-10-CM | POA: Diagnosis not present

## 2023-06-29 DIAGNOSIS — L570 Actinic keratosis: Secondary | ICD-10-CM | POA: Diagnosis not present

## 2023-06-29 DIAGNOSIS — D225 Melanocytic nevi of trunk: Secondary | ICD-10-CM | POA: Diagnosis not present

## 2023-07-04 NOTE — Addendum Note (Signed)
Addended by: Armanda Magic R on: 07/04/2023 12:24 PM   Modules accepted: Orders

## 2023-07-08 DIAGNOSIS — E039 Hypothyroidism, unspecified: Secondary | ICD-10-CM | POA: Diagnosis not present

## 2023-07-20 ENCOUNTER — Ambulatory Visit (HOSPITAL_COMMUNITY)
Admission: RE | Admit: 2023-07-20 | Discharge: 2023-07-20 | Disposition: A | Discharge status: Home / Self Care | Payer: PPO | Source: Ambulatory Visit | Attending: Cardiovascular Disease | Admitting: Cardiovascular Disease

## 2023-07-20 DIAGNOSIS — I6523 Occlusion and stenosis of bilateral carotid arteries: Secondary | ICD-10-CM | POA: Diagnosis not present

## 2023-07-25 ENCOUNTER — Encounter (HOSPITAL_COMMUNITY): Payer: Self-pay

## 2023-07-25 ENCOUNTER — Telehealth: Payer: Self-pay

## 2023-07-25 NOTE — Telephone Encounter (Signed)
Call to patient to advise that carotid scan had no evidence of carotid artery stenosis. Patient verbalized understanding.

## 2023-07-25 NOTE — Telephone Encounter (Signed)
-----   Message from Armanda Magic sent at 07/21/2023  1:14 PM EST ----- No evidence of internal carotid artery stenosis

## 2023-07-26 ENCOUNTER — Telehealth (HOSPITAL_COMMUNITY): Payer: Self-pay | Admitting: *Deleted

## 2023-07-26 NOTE — Telephone Encounter (Signed)
Reaching out to patient to offer assistance regarding upcoming cardiac imaging study; pt verbalizes understanding of appt date/time, parking situation and where to check in, pre-test NPO status name and call back number provided for further questions should they arise  Larey Brick RN Navigator Cardiac Imaging [facility] [phone] office [phone] cell  Patient aware to avoid caffeine 12 hours prior to his cardiac PET scan.

## 2023-07-27 ENCOUNTER — Encounter (HOSPITAL_COMMUNITY)
Admission: RE | Admit: 2023-07-27 | Discharge: 2023-07-27 | Disposition: A | Discharge status: Home / Self Care | Payer: PPO | Source: Ambulatory Visit | Attending: Cardiology | Admitting: Cardiology

## 2023-07-27 DIAGNOSIS — R9431 Abnormal electrocardiogram [ECG] [EKG]: Secondary | ICD-10-CM

## 2023-07-27 DIAGNOSIS — I2510 Atherosclerotic heart disease of native coronary artery without angina pectoris: Secondary | ICD-10-CM | POA: Diagnosis not present

## 2023-07-27 LAB — NM PET CT CARDIAC PERFUSION MULTI W/ABSOLUTE BLOODFLOW
LV dias vol: 116 mL (ref 62–150)
LV sys vol: 74 mL
MBFR: 1.02
Nuc Rest EF: 32 %
Nuc Stress EF: 36 %
Peak HR: 139 {beats}/min
Rest HR: 100 {beats}/min
Rest MBF: 0.85 ml/g/min
Rest Nuclear Isotope Dose: 19.6 mCi
Rest perfusion cavity size (mL): 116 mL
SDS: 20
SRS: 15
SSS: 35
ST Depression (mm): 0 mm
Stress MBF: 0.87 ml/g/min
Stress Nuclear Isotope Dose: 19.6 mCi
Stress perfusion cavity size (mL): 175 mL
TID: 1.5

## 2023-07-27 MED ORDER — RUBIDIUM RB82 GENERATOR (RUBYFILL)
19.6400 | PACK | Freq: Once | INTRAVENOUS | Status: AC
  Administered 2023-07-27: 19.64 via INTRAVENOUS

## 2023-07-27 MED ORDER — RUBIDIUM RB82 GENERATOR (RUBYFILL)
19.6000 | PACK | Freq: Once | INTRAVENOUS | Status: AC
  Administered 2023-07-27: 19.61 via INTRAVENOUS

## 2023-07-27 MED ORDER — REGADENOSON 0.4 MG/5ML IV SOLN
INTRAVENOUS | Status: AC
  Filled 2023-07-27: qty 5

## 2023-07-27 MED ORDER — REGADENOSON 0.4 MG/5ML IV SOLN
0.4000 mg | Freq: Once | INTRAVENOUS | Status: AC
  Administered 2023-07-27: 0.4 mg via INTRAVENOUS

## 2023-08-01 ENCOUNTER — Telehealth: Payer: Self-pay | Admitting: Cardiology

## 2023-08-01 ENCOUNTER — Telehealth: Payer: Self-pay

## 2023-08-01 DIAGNOSIS — R9439 Abnormal result of other cardiovascular function study: Secondary | ICD-10-CM

## 2023-08-01 NOTE — Telephone Encounter (Signed)
Pt c/o medication issue:  1. Name of Medication:  REPATHA SURECLICK 140 MG/ML SOAJ  2. How are you currently taking this medication (dosage and times per day)?   3. Are you having a reaction (difficulty breathing--STAT)?   4. What is your medication issue?   Patient states he needs a new prescription, but he would like assistance with re-enrolling in patient assistance first. He states he does not remember the name of the foundation he used previously.  Patient would also like to discuss stress test results. Please advise.

## 2023-08-01 NOTE — Telephone Encounter (Signed)
-----   Message from Armanda Magic sent at 07/27/2023  9:38 PM EST ----- Please get 2D echo to further assess EF

## 2023-08-01 NOTE — Telephone Encounter (Signed)
Call to patient to discuss stress test results. Per Dr. Mayford Knife , patient had abnormal stress test with ischemia in several areas and heart function has declined as well from prior.  advised patient he needs cardiac cath given high risk scan and worsening heart function. No extender appts available, patient agrees to appt w/ DOD on 08/05/23 to set up for cath. Also advised Dr. Mayford Knife recommends echo, patient verbalizes understanding and agrees to plan.

## 2023-08-02 ENCOUNTER — Telehealth: Payer: Self-pay | Admitting: Cardiology

## 2023-08-02 ENCOUNTER — Telehealth: Payer: Self-pay

## 2023-08-02 ENCOUNTER — Other Ambulatory Visit (HOSPITAL_COMMUNITY): Payer: Self-pay

## 2023-08-02 NOTE — Telephone Encounter (Signed)
Hayden [name] renewed, patient and pharmacy notified.

## 2023-08-02 NOTE — Telephone Encounter (Signed)
Error

## 2023-08-02 NOTE — Telephone Encounter (Signed)
Patient Advocate Encounter   The patient was approved for a [employer] grant that will help cover the cost of REPATHA Total amount awarded, $2,000.  Effective: 07/03/23 - 07/01/24   [id number] JYN:[id number] [id number]  Pharmacy provided with approval and processing information. Patient informed via Dorcas Carrow, CPhT  Pharmacy Patient Advocate Specialist  Direct Number: [phone] Fax: [phone]

## 2023-08-05 ENCOUNTER — Encounter: Payer: Self-pay | Admitting: Cardiology

## 2023-08-05 ENCOUNTER — Ambulatory Visit: Payer: PPO | Attending: Cardiology | Admitting: Cardiology

## 2023-08-05 ENCOUNTER — Other Ambulatory Visit: Payer: Self-pay | Admitting: *Deleted

## 2023-08-05 VITALS — BP 136/70 | HR 76 | Resp 16 | Ht 68.0 in | Wt 166.8 lb

## 2023-08-05 DIAGNOSIS — R9439 Abnormal result of other cardiovascular function study: Secondary | ICD-10-CM | POA: Diagnosis not present

## 2023-08-05 DIAGNOSIS — I2510 Atherosclerotic heart disease of native coronary artery without angina pectoris: Secondary | ICD-10-CM

## 2023-08-05 DIAGNOSIS — Z01812 Encounter for preprocedural laboratory examination: Secondary | ICD-10-CM | POA: Diagnosis not present

## 2023-08-05 NOTE — Progress Notes (Signed)
Cardiology Office Note:  .   Date:  08/05/2023  ID:  Hayden [name], DOB [birth year], MRN [id number] PCP: Hayden James, MD  [facility] Providers Cardiologist:  Hayden Magic, MD     History of Present Illness: .   Hayden [name] is a 77 y.o. male Discussed with the use of AI scribe  History of Present Illness   A 77 year old patient with a history of coronary artery disease, hypertension, and dyslipidemia presents for discussion of an abnormal stress test. The patient underwent a nuclear PET CT, which revealed ischemia in the distribution of the native right coronary artery and proximal LAD artery, suggesting occlusion of the native vessel with a patent LIMA bypass to the LAD artery. The ejection fraction was reported at 36%. The patient had a history of bypass surgery in 2000 and a myocardial infarction in 1994, treated with PTCA of the left circumflex.  The patient's current medications include amlodipine 10mg  daily and Zebutal 2.5mg  daily for hypertension, Repatha and Zetia for dyslipidemia, aspirin 81mg , and levothyroxine 75mg  for hypothyroidism.  Despite the abnormal stress test, the patient reports feeling well and maintains an active lifestyle, including golfing twice a week and regular gym visits. The patient denies experiencing chest pain or shortness of breath. The stress test was prompted by the fact that the patient had not had one in approximately ten years.  The patient has lost significant weight recently, dropping from 196lbs to 160lbs through dietary changes and increased physical activity. Despite being on Repatha, the patient expresses confusion and frustration about the presence of plaque and potential blood flow issues. The patient is scheduled for an echocardiogram in early January.          ROS: No SOB, no CP.   Studies Reviewed: .        Results   RADIOLOGY Nuclear PET CT: Ischemia in the distribution of native right coronary artery and proximal LAD artery.  Consider occluded native vessel with patent LIMA bypass to LAD artery. (07/27/2023) Ejection fraction: 36%  High risk study  ECG today: SR 71 PVC, NSSTW changes     Risk Assessment/Calculations:            Physical Exam:   VS:  BP 136/70 (BP Location: Left Arm, Patient Position: Sitting, Cuff Size: Normal)   Pulse 76   Resp 16   Ht 5\' 8"  (1.727 m)   Wt 166 lb 12.8 oz (75.7 kg)   SpO2 95%   BMI 25.36 kg/m    Wt Readings from Last 3 Encounters:  08/05/23 166 lb 12.8 oz (75.7 kg)  06/27/23 165 lb (74.8 kg)  07/09/22 168 lb 3.2 oz (76.3 kg)    GEN: Well nourished, well developed in no acute distress NECK: No JVD; No carotid bruits CARDIAC: RRR, no murmurs, no rubs, no gallops RESPIRATORY:  Clear to auscultation without rales, wheezing or rhonchi  ABDOMEN: Soft, non-tender, non-distended EXTREMITIES:  No edema; No deformity   ASSESSMENT AND PLAN: .    Assessment and Plan    Coronary Artery Disease with Ischemia Coronary artery disease with prior myocardial infarction in 1994 and bypass surgery in 2000. Recent nuclear PET CT on 07/27/2023 showed ischemia in the distribution of the native right coronary artery and proximal LAD artery, with a decreased ejection fraction of 36%. Asymptomatic but high-risk stress test indicates potential blood flow issues. Heart catheterization planned to assess bypass grafts and native vessels. Risks include stroke, heart attack, or death (<1 in 1000). Benefits  include identifying and potentially addressing compromised blood flow. Low likelihood of needing redo bypass surgery. - Schedule heart catheterization - Perform left heart catheterization with bypass grafts and possible PCI  Chronic Diastolic Heart Failure Chronic diastolic heart failure with recent stress test indicating a decreased ejection fraction of 36%. Echocardiogram scheduled for September 01, 2023, to further evaluate heart function. - Perform echocardiogram on September 01, 2023  Hypertension Hypertension, well-controlled with blood pressure readings in the 120s/70s. On amlodipine 10 mg daily and Zebutal 2.5 mg daily. - Continue current antihypertensive medications (amlodipine 10 mg daily, Zebutal 2.5 mg daily)  Dyslipidemia Dyslipidemia, well-controlled on Repatha and Zetia with recent cholesterol level of 90. - Continue current lipid-lowering medications (Repatha, Zetia)  Hypothyroidism Hypothyroidism, managed with levothyroxine 75 mcg daily. - Continue current thyroid medication (levothyroxine 75 mcg daily)  General Health Maintenance Significant lifestyle changes, including weight loss and regular exercise, have positively impacted overall health. - Encourage continued healthy lifestyle choices, including regular exercise and a balanced diet  Follow-up - Follow up with primary cardiologist, Hayden [name], after heart catheterization results are available.           Informed Consent   Shared Decision Making/Informed Consent The risks [stroke (1 in 1000), death (1 in 1000), kidney failure [usually temporary] (1 in 500), bleeding (1 in 200), allergic reaction [possibly serious] (1 in 200)], benefits (diagnostic support and management of coronary artery disease) and alternatives of a cardiac catheterization were discussed in detail with Hayden [name] and he is willing to proceed.  Signed, Hayden Schultz, MD

## 2023-08-05 NOTE — H&P (View-Only) (Signed)
 Cardiology Office Note:  .   Date:  08/05/2023  ID:  Hayden [name], DOB [birth year], MRN [id number] PCP: Hayden James, MD  [facility] Providers Cardiologist:  Hayden Magic, MD     History of Present Illness: .   Hayden [name] is a 77 y.o. male Discussed with the use of AI scribe  History of Present Illness   A 77 year old patient with a history of coronary artery disease, hypertension, and dyslipidemia presents for discussion of an abnormal stress test. The patient underwent a nuclear PET CT, which revealed ischemia in the distribution of the native right coronary artery and proximal LAD artery, suggesting occlusion of the native vessel with a patent LIMA bypass to the LAD artery. The ejection fraction was reported at 36%. The patient had a history of bypass surgery in 2000 and a myocardial infarction in 1994, treated with PTCA of the left circumflex.  The patient's current medications include amlodipine 10mg  daily and Zebutal 2.5mg  daily for hypertension, Repatha and Zetia for dyslipidemia, aspirin 81mg , and levothyroxine 75mg  for hypothyroidism.  Despite the abnormal stress test, the patient reports feeling well and maintains an active lifestyle, including golfing twice a week and regular gym visits. The patient denies experiencing chest pain or shortness of breath. The stress test was prompted by the fact that the patient had not had one in approximately ten years.  The patient has lost significant weight recently, dropping from 196lbs to 160lbs through dietary changes and increased physical activity. Despite being on Repatha, the patient expresses confusion and frustration about the presence of plaque and potential blood flow issues. The patient is scheduled for an echocardiogram in early January.          ROS: No SOB, no CP.   Studies Reviewed: .        Results   RADIOLOGY Nuclear PET CT: Ischemia in the distribution of native right coronary artery and proximal LAD artery.  Consider occluded native vessel with patent LIMA bypass to LAD artery. (07/27/2023) Ejection fraction: 36%  High risk study  ECG today: SR 71 PVC, NSSTW changes     Risk Assessment/Calculations:            Physical Exam:   VS:  BP 136/70 (BP Location: Left Arm, Patient Position: Sitting, Cuff Size: Normal)   Pulse 76   Resp 16   Ht 5\' 8"  (1.727 m)   Wt 166 lb 12.8 oz (75.7 kg)   SpO2 95%   BMI 25.36 kg/m    Wt Readings from Last 3 Encounters:  08/05/23 166 lb 12.8 oz (75.7 kg)  06/27/23 165 lb (74.8 kg)  07/09/22 168 lb 3.2 oz (76.3 kg)    GEN: Well nourished, well developed in no acute distress NECK: No JVD; No carotid bruits CARDIAC: RRR, no murmurs, no rubs, no gallops RESPIRATORY:  Clear to auscultation without rales, wheezing or rhonchi  ABDOMEN: Soft, non-tender, non-distended EXTREMITIES:  No edema; No deformity   ASSESSMENT AND PLAN: .    Assessment and Plan    Coronary Artery Disease with Ischemia Coronary artery disease with prior myocardial infarction in 1994 and bypass surgery in 2000. Recent nuclear PET CT on 07/27/2023 showed ischemia in the distribution of the native right coronary artery and proximal LAD artery, with a decreased ejection fraction of 36%. Asymptomatic but high-risk stress test indicates potential blood flow issues. Heart catheterization planned to assess bypass grafts and native vessels. Risks include stroke, heart attack, or death (<1 in 1000). Benefits  include identifying and potentially addressing compromised blood flow. Low likelihood of needing redo bypass surgery. - Schedule heart catheterization - Perform left heart catheterization with bypass grafts and possible PCI  Chronic Diastolic Heart Failure Chronic diastolic heart failure with recent stress test indicating a decreased ejection fraction of 36%. Echocardiogram scheduled for September 01, 2023, to further evaluate heart function. - Perform echocardiogram on September 01, 2023  Hypertension Hypertension, well-controlled with blood pressure readings in the 120s/70s. On amlodipine 10 mg daily and Zebutal 2.5 mg daily. - Continue current antihypertensive medications (amlodipine 10 mg daily, Zebutal 2.5 mg daily)  Dyslipidemia Dyslipidemia, well-controlled on Repatha and Zetia with recent cholesterol level of 90. - Continue current lipid-lowering medications (Repatha, Zetia)  Hypothyroidism Hypothyroidism, managed with levothyroxine 75 mcg daily. - Continue current thyroid medication (levothyroxine 75 mcg daily)  General Health Maintenance Significant lifestyle changes, including weight loss and regular exercise, have positively impacted overall health. - Encourage continued healthy lifestyle choices, including regular exercise and a balanced diet  Follow-up - Follow up with primary cardiologist, Dr. Mayford [name], after heart catheterization results are available.           Informed Consent   Shared Decision Making/Informed Consent The risks [stroke (1 in 1000), death (1 in 1000), kidney failure [usually temporary] (1 in 500), bleeding (1 in 200), allergic reaction [possibly serious] (1 in 200)], benefits (diagnostic support and management of coronary artery disease) and alternatives of a cardiac catheterization were discussed in detail with Hayden [name] and he is willing to proceed.  Signed, Hayden Schultz, MD

## 2023-08-05 NOTE — Patient Instructions (Addendum)
Medication Instructions:  The current medical regimen is effective;  continue present plan and medications.  *If you need a refill on your cardiac medications before your next appointment, please call your pharmacy*   Lab Work: Please have blood work at [facility] (CBC, BMP) on Monday 08/15/2023.  If you have labs (blood work) drawn today and your tests are completely normal, you will receive your results only by: MyChart Message (if you have MyChart) OR A paper copy in the mail If you have any lab test that is abnormal or we need to change your treatment, we will call you to review the results.   Testing/Procedures:   [facility] A DEPT OF [place]. [facility] AT [facility] [address] [place], [place] [address] [place] [address] Dept: [phone] Loc: [phone]  JERMALL MAHLBERG  08/05/2023  You are scheduled for a Cardiac Catheterization on Wednesday, December 18 with Dr. Verne Carrow.  1. Please arrive at the [facility] (Main Entrance A) at [facility]: [address] [place], [place] [address] at 6:30 AM (two hours before your procedure to ensure your preparation). Free valet parking service is available.   Special note: Every effort is made to have your procedure done on time. Please understand that emergencies sometimes delay scheduled procedures.  2. Diet: Do not eat or drink anything after midnight prior to your procedure except sips of water to take medications.  3. Labs:  Monday 08/15/23 at [facility]  4. Medication instructions in preparation for your procedure:  On the morning of your procedure, take your Aspirin 81 mg and any morning medicines NOT listed above.  You may use sips of water. You may hold your Metformin AM.  5. Plan for one night stay--bring personal belongings. 6. Bring a current list of your medications and current insurance cards. 7. You MUST have a responsible person to drive you home. 8.  Someone MUST be with you the first 24 hours after you arrive home or your discharge will be delayed. 9. Please wear clothes that are easy to get on and off and wear slip-on shoes.  Thank you for allowing [place] to care for you!   -- [facility]  Follow-Up: At [facility], you and your health needs are our priority.  As part of our continuing mission to provide you with exceptional heart care, we have created designated Provider Care Teams.  These Care Teams include your primary Cardiologist (physician) and Advanced Practice Providers (APPs -  Physician Assistants and Nurse Practitioners) who all work together to provide you with the care you need, when you need it.  We recommend signing up for the patient portal called "MyChart".  Sign up information is provided on this After Visit Summary.  MyChart is used to connect with patients for Virtual Visits (Telemedicine).  Patients are able to view lab/test results, encounter notes, upcoming appointments, etc.  Non-urgent messages can be sent to your provider as well.   To learn more about what you can do with MyChart, go to [email or web address].    Your next appointment:   Follow up will be based on the results of the above testing.

## 2023-08-15 DIAGNOSIS — R9439 Abnormal result of other cardiovascular function study: Secondary | ICD-10-CM | POA: Diagnosis not present

## 2023-08-15 DIAGNOSIS — I2510 Atherosclerotic heart disease of native coronary artery without angina pectoris: Secondary | ICD-10-CM | POA: Diagnosis not present

## 2023-08-15 DIAGNOSIS — Z01812 Encounter for preprocedural laboratory examination: Secondary | ICD-10-CM | POA: Diagnosis not present

## 2023-08-16 ENCOUNTER — Telehealth: Payer: Self-pay | Admitting: *Deleted

## 2023-08-16 LAB — BASIC METABOLIC PANEL
BUN/Creatinine Ratio: 19 (ref 10–24)
BUN: 15 mg/dL (ref 8–27)
CO2: 20 mmol/L (ref 20–29)
Calcium: 9.8 mg/dL (ref 8.6–10.2)
Chloride: 98 mmol/L (ref 96–106)
Creatinine, Ser: 0.8 mg/dL (ref 0.76–1.27)
Glucose: 132 mg/dL — ABNORMAL HIGH (ref 70–99)
Potassium: 4.3 mmol/L (ref 3.5–5.2)
Sodium: 137 mmol/L (ref 134–144)
eGFR: 91 mL/min/{1.73_m2} (ref >59)

## 2023-08-16 LAB — CBC
Hematocrit: 48.5 % (ref 37.5–51.0)
Hemoglobin: 16.2 g/dL (ref 13.0–17.7)
MCH: 31.1 pg (ref 26.6–33.0)
MCHC: 33.4 g/dL (ref 31.5–35.7)
MCV: 93 fL (ref 79–97)
Platelets: 205 10*3/uL (ref 150–450)
RBC: 5.21 x10E6/uL (ref 4.14–5.80)
RDW: 12.2 % (ref 11.6–15.4)
WBC: 13.3 10*3/uL — ABNORMAL HIGH (ref 3.4–10.8)

## 2023-08-16 NOTE — Telephone Encounter (Signed)
Cardiac Catheterization scheduled at [facility] for: Wednesday August 17, 2023 8:30 AM Arrival time [facility] Main Entrance A at: 6:30 AM  Nothing to eat after midnight prior to procedure, clear liquids until 5 AM day of procedure.  Medication instructions: -Hold:  Metformin-day of procedure and 48 hours post procedure -Other usual morning medications can be taken with sips of water including aspirin 81 mg.  Plan to go home the same day, you will only stay overnight if medically necessary.  You must have responsible adult to drive you home.  Someone must be with you the first 24 hours after you arrive home.  Reviewed procedure instructions with patient.

## 2023-08-17 ENCOUNTER — Ambulatory Visit (HOSPITAL_COMMUNITY)
Admission: RE | Admit: 2023-08-17 | Discharge: 2023-08-17 | Disposition: A | Discharge status: Home / Self Care | Payer: PPO | Attending: Cardiovascular Disease | Admitting: Cardiovascular Disease

## 2023-08-17 ENCOUNTER — Encounter (HOSPITAL_COMMUNITY)
Admission: RE | Disposition: A | Discharge status: Home / Self Care | Payer: Self-pay | Source: Home / Self Care | Attending: Cardiovascular Disease

## 2023-08-17 ENCOUNTER — Other Ambulatory Visit (HOSPITAL_COMMUNITY): Payer: Self-pay

## 2023-08-17 ENCOUNTER — Other Ambulatory Visit: Payer: Self-pay

## 2023-08-17 DIAGNOSIS — R9439 Abnormal result of other cardiovascular function study: Secondary | ICD-10-CM | POA: Diagnosis not present

## 2023-08-17 DIAGNOSIS — E039 Hypothyroidism, unspecified: Secondary | ICD-10-CM | POA: Diagnosis not present

## 2023-08-17 DIAGNOSIS — Z01818 Encounter for other preprocedural examination: Secondary | ICD-10-CM

## 2023-08-17 DIAGNOSIS — Z79890 Hormone replacement therapy (postmenopausal): Secondary | ICD-10-CM | POA: Insufficient documentation

## 2023-08-17 DIAGNOSIS — I110 Hypertensive heart disease with heart failure: Secondary | ICD-10-CM | POA: Insufficient documentation

## 2023-08-17 DIAGNOSIS — I25810 Atherosclerosis of coronary artery bypass graft(s) without angina pectoris: Secondary | ICD-10-CM | POA: Insufficient documentation

## 2023-08-17 DIAGNOSIS — Z79899 Other long term (current) drug therapy: Secondary | ICD-10-CM | POA: Diagnosis not present

## 2023-08-17 DIAGNOSIS — I2582 Chronic total occlusion of coronary artery: Secondary | ICD-10-CM | POA: Insufficient documentation

## 2023-08-17 DIAGNOSIS — I5032 Chronic diastolic (congestive) heart failure: Secondary | ICD-10-CM | POA: Diagnosis not present

## 2023-08-17 DIAGNOSIS — I252 Old myocardial infarction: Secondary | ICD-10-CM | POA: Insufficient documentation

## 2023-08-17 DIAGNOSIS — G720 Drug-induced myopathy: Secondary | ICD-10-CM

## 2023-08-17 DIAGNOSIS — Z7982 Long term (current) use of aspirin: Secondary | ICD-10-CM | POA: Insufficient documentation

## 2023-08-17 DIAGNOSIS — I2510 Atherosclerotic heart disease of native coronary artery without angina pectoris: Secondary | ICD-10-CM | POA: Diagnosis present

## 2023-08-17 DIAGNOSIS — E785 Hyperlipidemia, unspecified: Secondary | ICD-10-CM | POA: Diagnosis not present

## 2023-08-17 DIAGNOSIS — R0989 Other specified symptoms and signs involving the circulatory and respiratory systems: Secondary | ICD-10-CM

## 2023-08-17 DIAGNOSIS — Z955 Presence of coronary angioplasty implant and graft: Secondary | ICD-10-CM

## 2023-08-17 HISTORY — PX: CORONARY STENT INTERVENTION: CATH118234

## 2023-08-17 HISTORY — PX: LEFT HEART CATH AND CORS/GRAFTS ANGIOGRAPHY: CATH118250

## 2023-08-17 LAB — POCT ACTIVATED CLOTTING TIME: Activated Clotting Time: 331 s

## 2023-08-17 LAB — GLUCOSE, CAPILLARY: Glucose-Capillary: 169 mg/dL — ABNORMAL HIGH (ref 70–99)

## 2023-08-17 SURGERY — LEFT HEART CATH AND CORS/GRAFTS ANGIOGRAPHY
Anesthesia: LOCAL

## 2023-08-17 MED ORDER — MIDAZOLAM HCL 2 MG/2ML IJ SOLN
INTRAMUSCULAR | Status: AC
  Filled 2023-08-17: qty 2

## 2023-08-17 MED ORDER — FAMOTIDINE IN NACL 20-0.9 MG/50ML-% IV SOLN
INTRAVENOUS | Status: AC
  Filled 2023-08-17: qty 50

## 2023-08-17 MED ORDER — NITROGLYCERIN 0.4 MG SL SUBL
0.4000 mg | SUBLINGUAL_TABLET | SUBLINGUAL | 2 refills | Status: DC | PRN
  Filled 2023-08-17: qty 25, 5d supply, fill #0

## 2023-08-17 MED ORDER — EZETIMIBE 10 MG PO TABS: 10.0000 mg | ORAL_TABLET | Freq: Every day | ORAL | Status: DC

## 2023-08-17 MED ORDER — HYDRALAZINE HCL 20 MG/ML IJ SOLN: 10.0000 mg | INTRAMUSCULAR | Status: DC | PRN

## 2023-08-17 MED ORDER — BISOPROLOL FUMARATE 5 MG PO TABS: 2.5000 mg | ORAL_TABLET | Freq: Every day | ORAL | Status: DC

## 2023-08-17 MED ORDER — SODIUM CHLORIDE 0.9 % IV SOLN: INTRAVENOUS | Status: AC

## 2023-08-17 MED ORDER — ACETAMINOPHEN 325 MG PO TABS: 650.0000 mg | ORAL_TABLET | ORAL | Status: DC | PRN

## 2023-08-17 MED ORDER — HEPARIN (PORCINE) IN NACL 1000-0.9 UT/500ML-% IV SOLN
INTRAVENOUS | Status: DC | PRN
  Administered 2023-08-17: 500 mL
  Administered 2023-08-17: 1000 mL

## 2023-08-17 MED ORDER — ANGIOPLASTY BOOK
Status: AC
  Filled 2023-08-17: qty 1

## 2023-08-17 MED ORDER — CLOPIDOGREL BISULFATE 75 MG PO TABS: 75.0000 mg | ORAL_TABLET | Freq: Every day | ORAL | Status: DC

## 2023-08-17 MED ORDER — IOHEXOL 350 MG/ML SOLN
INTRAVENOUS | Status: DC | PRN
  Administered 2023-08-17: 60 mL

## 2023-08-17 MED ORDER — CLOPIDOGREL BISULFATE 75 MG PO TABS
75.0000 mg | ORAL_TABLET | Freq: Every day | ORAL | 2 refills | Status: DC
  Filled 2023-08-17: qty 90, 90d supply, fill #0

## 2023-08-17 MED ORDER — ONDANSETRON HCL 4 MG/2ML IJ SOLN: 4.0000 mg | Freq: Four times a day (QID) | INTRAMUSCULAR | Status: DC | PRN

## 2023-08-17 MED ORDER — MIDAZOLAM HCL 2 MG/2ML IJ SOLN
INTRAMUSCULAR | Status: DC | PRN
  Administered 2023-08-17: 1 mg via INTRAVENOUS
  Administered 2023-08-17: 2 mg via INTRAVENOUS

## 2023-08-17 MED ORDER — SODIUM CHLORIDE 0.9% FLUSH: 3.0000 mL | INTRAVENOUS | Status: DC | PRN

## 2023-08-17 MED ORDER — PANTOPRAZOLE SODIUM 40 MG PO TBEC
40.0000 mg | DELAYED_RELEASE_TABLET | Freq: Every day | ORAL | 1 refills | Status: DC
  Filled 2023-08-17: qty 90, 90d supply, fill #0
  Filled 2023-11-11: qty 90, 90d supply, fill #1

## 2023-08-17 MED ORDER — SODIUM CHLORIDE 0.9 % IV SOLN: 250.0000 mL | INTRAVENOUS | Status: DC | PRN

## 2023-08-17 MED ORDER — VERAPAMIL HCL 2.5 MG/ML IV SOLN
INTRAVENOUS | Status: AC
  Filled 2023-08-17: qty 2

## 2023-08-17 MED ORDER — VERAPAMIL HCL 2.5 MG/ML IV SOLN
INTRAVENOUS | Status: DC | PRN
  Administered 2023-08-17: 10 mL via INTRA_ARTERIAL

## 2023-08-17 MED ORDER — SODIUM CHLORIDE 0.9% FLUSH: 3.0000 mL | Freq: Two times a day (BID) | INTRAVENOUS | Status: DC

## 2023-08-17 MED ORDER — LIDOCAINE HCL (PF) 1 % IJ SOLN
INTRAMUSCULAR | Status: DC | PRN
  Administered 2023-08-17: 2 mL

## 2023-08-17 MED ORDER — LEVOTHYROXINE SODIUM 75 MCG PO TABS: 75.0000 ug | ORAL_TABLET | Freq: Every day | ORAL | Status: DC

## 2023-08-17 MED ORDER — HEPARIN SODIUM (PORCINE) 1000 UNIT/ML IJ SOLN
INTRAMUSCULAR | Status: DC | PRN
  Administered 2023-08-17: 6500 [IU] via INTRAVENOUS
  Administered 2023-08-17: 3500 [IU] via INTRAVENOUS

## 2023-08-17 MED ORDER — FENTANYL CITRATE (PF) 100 MCG/2ML IJ SOLN
INTRAMUSCULAR | Status: DC | PRN
  Administered 2023-08-17 (×2): 50 ug via INTRAVENOUS

## 2023-08-17 MED ORDER — SODIUM CHLORIDE 0.9 % IV SOLN: INTRAVENOUS | Status: DC

## 2023-08-17 MED ORDER — FENTANYL CITRATE (PF) 100 MCG/2ML IJ SOLN
INTRAMUSCULAR | Status: AC
  Filled 2023-08-17: qty 2

## 2023-08-17 MED ORDER — ASPIRIN 81 MG PO TBEC: 81.0000 mg | DELAYED_RELEASE_TABLET | Freq: Every day | ORAL | Status: DC

## 2023-08-17 MED ORDER — ASPIRIN 81 MG PO CHEW: 81.0000 mg | CHEWABLE_TABLET | ORAL | Status: DC

## 2023-08-17 MED ORDER — AMLODIPINE BESYLATE 5 MG PO TABS: 10.0000 mg | ORAL_TABLET | Freq: Every day | ORAL | Status: DC

## 2023-08-17 MED ORDER — CLOPIDOGREL BISULFATE 300 MG PO TABS
ORAL_TABLET | ORAL | Status: DC | PRN
  Administered 2023-08-17: 600 mg via ORAL

## 2023-08-17 MED ORDER — LIDOCAINE HCL (PF) 1 % IJ SOLN
INTRAMUSCULAR | Status: AC
  Filled 2023-08-17: qty 30

## 2023-08-17 MED ORDER — LABETALOL HCL 5 MG/ML IV SOLN: 10.0000 mg | INTRAVENOUS | Status: DC | PRN

## 2023-08-17 MED ORDER — LOSARTAN POTASSIUM 50 MG PO TABS: 25.0000 mg | ORAL_TABLET | Freq: Every day | ORAL | Status: DC

## 2023-08-17 MED ORDER — FAMOTIDINE IN NACL 20-0.9 MG/50ML-% IV SOLN
INTRAVENOUS | Status: DC | PRN
  Administered 2023-08-17: 20 mg via INTRAVENOUS

## 2023-08-17 MED ORDER — HEPARIN SODIUM (PORCINE) 1000 UNIT/ML IJ SOLN
INTRAMUSCULAR | Status: AC
  Filled 2023-08-17: qty 10

## 2023-08-17 SURGICAL SUPPLY — 15 items
CATH INFINITI 5FR MULTPACK ANG (CATHETERS) IMPLANT
CATH VISTA GUIDE 6FR JR4 (CATHETERS) IMPLANT
DEVICE RAD COMP TR BAND LRG (VASCULAR PRODUCTS) IMPLANT
DEVICE SPIDERFX EMB PROT 3MM (WIRE) IMPLANT
GLIDESHEATH SLEND SS 6F .021 (SHEATH) IMPLANT
GUIDEWIRE INQWIRE 1.5J.035X260 (WIRE) IMPLANT
INQWIRE 1.5J .035X260CM (WIRE) ×1
KIT ENCORE 26 ADVANTAGE (KITS) IMPLANT
KIT SYRINGE INJ CVI SPIKEX1 (MISCELLANEOUS) IMPLANT
PACK CARDIAC CATHETERIZATION (CUSTOM PROCEDURE TRAY) ×1 IMPLANT
SET ATX-X65L (MISCELLANEOUS) IMPLANT
SHEATH PROBE COVER 6X72 (BAG) IMPLANT
STENT SYNERGY XD 3.0X16 (Permanent Stent) IMPLANT
SYNERGY XD 3.0X16 (Permanent Stent) ×1 IMPLANT
WIRE ASAHI PROWATER 180CM (WIRE) IMPLANT

## 2023-08-17 NOTE — Discharge Summary (Signed)
Discharge Summary for Same Day PCI   Patient ID: Hayden [name] MRN: [id number]; DOB: Oct 15, 1945  Admit date: 08/17/2023 Discharge date: 08/17/2023  Primary Care Provider: Deatra James, MD  Primary Cardiologist: Armanda Magic, MD  Primary Electrophysiologist:  None   Discharge Diagnoses    Active Problems:   Coronary artery disease   Diagnostic Studies/Procedures    Cardiac Catheterization 08/17/2023:    Prox RCA to Dist RCA lesion is 100% stenosed.   Mid Cx lesion is 100% stenosed.   Ost Cx to Prox Cx lesion is 99% stenosed.   Dist LM to Prox LAD lesion is 100% stenosed.   Origin to Prox Graft lesion is 80% stenosed.   A drug-eluting stent was successfully placed using a SYNERGY XD 3.0X16.   Post intervention, there is a 0% residual stenosis.   LIMA graft was visualized by angiography and is normal in caliber.   SVG graft was visualized by angiography and is normal in caliber.   Severe triple vessel CAD s/p 3V CABG with 2/3 patent bypass grafts Chronic occlusion of the ostial LAD. Patent LIMA to mid LAD Severe ostial Circumflex stenosis followed by chronic total occlusion of the mid Circumflex. Patent vein graft to the distal obtuse marginal branch that fills the entire Circumflex and gives collaterals to the RCA. Severe stenosis proximal body of the vein graft.  Chronic occlusion ostial RCA. The distal RCA fills from left to right collaterals supplied by flow from the vein graft through the Circumflex. The graft to the PDA is known to be occluded.  Successful PTCA/DES x 1 proximal body of SVG to OM Preserved LV systolic function LVEDP 15 mmHg   Recommendations: Will continue DAPT with ASA and Plavix for at least six months. Same day post PCI discharge in 4 hours. Will change Prilosec to Protonix given the use of Plavix.   Diagnostic Dominance: Right  Intervention   _____________   History of Present Illness     Hayden [name] is a 77 y.o. male with past medical  history of CAD status post prior CABG' 00, hypertension, hyperlipidemia who is normally followed by Dr. Mayford Knife as an outpatient.  He underwent outpatient nuclear PET CT that showed ischemia in the distribution of the RCA and proximal LAD territory suggesting occlusion of native vessel with patent LIMA to LAD.  EF reported at 36%.  He was seen in the clinic on 12/6 with Dr. Anne Fu to discuss findings.  Given abnormal PET/CT it was recommended that he undergo outpatient cardiac catheterization.   Hospital Course     The patient underwent cardiac cath as noted above with severe triple vessel CAD status post three-vessel CABG with 2 of 3 patent bypass grafts.  Patent LIMA to LAD, severe stenosis in proximal body of SVG to OM1 treated with PCI/DES x 1. Plan for DAPT with ASA/Plavix for at least 6 months. The patient was seen by cardiac rehab while in short stay. There were no observed complications post cath. Radial cath site was re-evaluated prior to discharge and found to be stable without any complications. Instructions/precautions regarding cath site care were given prior to discharge.  Hayden [name] was seen by Dr. Clifton James and determined stable for discharge home. Follow up with our office has been arranged. Medications are listed below. Pertinent changes include addition of plavix, and switched to protonix. _____________  Cath/PCI Registry Performance & Quality Measures: Aspirin prescribed? - Yes ADP Receptor Inhibitor (Plavix/Clopidogrel, Brilinta/Ticagrelor or Effient/Prasugrel) prescribed (includes medically managed patients)? -  Yes High Intensity Statin (Lipitor 40-80mg  or Crestor 20-40mg ) prescribed? - No - on repatha For EF <40%, was ACEI/ARB prescribed? - Not Applicable (EF >/= 40%) For EF <40%, Aldosterone Antagonist (Spironolactone or Eplerenone) prescribed? - Not Applicable (EF >/= 40%) Cardiac Rehab Phase II ordered (Included Medically managed Patients)? - Yes _____________  Discharge  Vitals Blood pressure 137/70, pulse (!) 56, temperature 97.8 F (36.6 C), temperature source Oral, resp. rate 17, height 5\' 8"  (1.727 m), weight 73 kg, SpO2 97%.  Filed Weights   08/17/23 0701  Weight: 73 kg    Last Labs & Radiologic Studies    CBC Recent Labs    08/15/23 1244  WBC 13.3*  HGB 16.2  HCT 48.5  MCV 93  PLT 205   Basic Metabolic Panel Recent Labs    14/78/29 1244  NA 137  K 4.3  CL 98  CO2 20  GLUCOSE 132*  BUN 15  CREATININE 0.80  CALCIUM 9.8   Liver Function Tests No results for input(s): "AST", "ALT", "ALKPHOS", "BILITOT", "PROT", "ALBUMIN" in the last 72 hours. No results for input(s): "LIPASE", "AMYLASE" in the last 72 hours. High Sensitivity Troponin:   No results for input(s): "TROPONINIHS" in the last 720 hours.  BNP Invalid input(s): "POCBNP" D-Dimer No results for input(s): "DDIMER" in the last 72 hours. Hemoglobin A1C No results for input(s): "HGBA1C" in the last 72 hours. Fasting Lipid Panel No results for input(s): "CHOL", "HDL", "LDLCALC", "TRIG", "CHOLHDL", "LDLDIRECT" in the last 72 hours. Thyroid Function Tests No results for input(s): "TSH", "T4TOTAL", "T3FREE", "THYROIDAB" in the last 72 hours.  Invalid input(s): "FREET3" _____________  CARDIAC CATHETERIZATION Addendum Date: 08/17/2023   Prox RCA to Dist RCA lesion is 100% stenosed.   Mid Cx lesion is 100% stenosed.   Ost Cx to Prox Cx lesion is 99% stenosed.   Dist LM to Prox LAD lesion is 100% stenosed.   Origin to Prox Graft lesion is 80% stenosed.   A drug-eluting stent was successfully placed using a SYNERGY XD 3.0X16.   Post intervention, there is a 0% residual stenosis.   LIMA graft was visualized by angiography and is normal in caliber.   SVG graft was visualized by angiography and is normal in caliber. Severe triple vessel CAD s/p 3V CABG with 2/3 patent bypass grafts Chronic occlusion of the ostial LAD. Patent LIMA to mid LAD Severe ostial Circumflex stenosis followed by  chronic total occlusion of the mid Circumflex. Patent vein graft to the distal obtuse marginal branch that fills the entire Circumflex and gives collaterals to the RCA. Severe stenosis proximal body of the vein graft. Chronic occlusion ostial RCA. The distal RCA fills from left to right collaterals supplied by flow from the vein graft through the Circumflex. The graft to the PDA is known to be occluded. Successful PTCA/DES x 1 proximal body of SVG to OM Preserved LV systolic function LVEDP 15 mmHg Recommendations: Will continue DAPT with ASA and Plavix for at least six months. Same day post PCI discharge in 4 hours. Will change Prilosec to Protonix given the use of Plavix.   Result Date: 08/17/2023   Prox RCA to Dist RCA lesion is 100% stenosed.   Mid Cx lesion is 100% stenosed.   Ost Cx to Prox Cx lesion is 99% stenosed.   Dist LM to Prox LAD lesion is 100% stenosed.   Origin to Prox Graft lesion is 80% stenosed.   A drug-eluting stent was successfully placed using a SYNERGY XD 3.0X16.  Post intervention, there is a 0% residual stenosis.   LIMA graft was visualized by angiography and is normal in caliber.   SVG graft was visualized by angiography and is normal in caliber. Severe triple vessel CAD s/p 3V CABG with 2/3 patent bypass grafts Chronic occlusion of the ostial LAD. Patent LIMA to mid LAD Severe ostial Circumflex stenosis followed by chronic total occlusion of the mid Circumflex. Patent vein graft to the distal obtuse marginal branch that fills the entire Circumflex and gives collaterals to the RCA. Severe stenosis proximal body of the vein graft. Chronic occlusion ostial RCA. The distal RCA fills from left to right collaterals supplied by flow from the vein graft through the Circumflex. The graft to the PDA is known to be occluded. Successful PTCA/DES x 1 proximal body of SVG to OM Preserved LV systolic function LVEDP 15 mmHg Recommendations: Will continue DAPT with ASA and Plavix for at least six  months. Same day post PCI discharge in 4 hours.   NM PET CT CARDIAC PERFUSION MULTI W/ABSOLUTE BLOODFLOW Addendum Date: 07/27/2023   Findings are consistent with ischemia in the distribution of the native right cortonary artery and proximal LAD artery (consider occluded native vessel with patent LIMA bypass to the distal LAD artery). The study is high risk. Development of rate related LBBB may affect the study specificity.   LV perfusion is abnormal. Defect 1: There is a medium defect with moderate reduction in uptake present in the mid to basal inferior and inferolateral location(s) that is reversible. There is abnormal wall motion in the defect area. Consistent with ischemia. Defect 2: There is a medium defect with moderate reduction in uptake present in the mid to basal anterior and anterolateral location(s) that is reversible. There is abnormal wall motion in the defect area. Consistent with ischemia.   There is prominent right ventricular tarcer uptake during stress images, which is poor prognostic sign.   Rest left ventricular function is abnormal. Rest global function is moderately reduced. Rest EF: 32%. Stress left ventricular function is abnormal. Stress global function is moderately reduced. Stress EF: 36%. End diastolic cavity size is normal. End systolic cavity size is normal.   Myocardial blood flow was computed to be 0.44ml/g/min at rest and 0.33ml/g/min at stress. Global myocardial blood flow reserve was 1.02 and was highly abnormal.   Coronary calcium assessment not performed due to prior revascularization.   Electronically signed by Thurmon Fair, MD CLINICAL DATA:  This over-read does not include interpretation of cardiac or coronary anatomy or pathology. No interpretation of the PET data set. The cardiac PET-CT interpretation by the cardiologist is attached. COMPARISON:  None Available. FINDINGS: Limited view of the lung parenchyma demonstrates no suspicious nodularity. Airways are normal.  Limited view of the mediastinum demonstrates no adenopathy. Esophagus normal. Limited view of the upper abdomen small to moderate moderate size hiatal hernia. Limited view of the skeleton and chest wall is unremarkable. IMPRESSION: Hiatal hernia. Electronically Signed   By: Genevive Bi M.D.   On: 07/27/2023 10:08   Result Date: 07/27/2023   Findings are consistent with ischemia in the distribution of the native right cortonary artery and proximal LAD artery (consider occluded native vessel with patent LIMA bypass to the distal LAD artery). The study is high risk. Development of rate related LBBB may affect the study specificity.   LV perfusion is abnormal. Defect 1: There is a medium defect with moderate reduction in uptake present in the mid to basal inferior and inferolateral location(s)  that is reversible. There is abnormal wall motion in the defect area. Consistent with ischemia. Defect 2: There is a medium defect with moderate reduction in uptake present in the mid to basal anterior and anterolateral location(s) that is reversible. There is abnormal wall motion in the defect area. Consistent with ischemia.   Rest left ventricular function is abnormal. Rest global function is moderately reduced. Rest EF: 32%. Stress left ventricular function is abnormal. Stress global function is moderately reduced. Stress EF: 36%. End diastolic cavity size is normal. End systolic cavity size is normal.   Myocardial blood flow was computed to be 0.77ml/g/min at rest and 0.68ml/g/min at stress. Global myocardial blood flow reserve was 1.02 and was highly abnormal.   Coronary calcium assessment not performed due to prior revascularization.   Electronically signed by Thurmon Fair, MD CLINICAL DATA:  This over-read does not include interpretation of cardiac or coronary anatomy or pathology. No interpretation of the PET data set. The cardiac PET-CT interpretation by the cardiologist is attached. COMPARISON:  None Available.  FINDINGS: Limited view of the lung parenchyma demonstrates no suspicious nodularity. Airways are normal. Limited view of the mediastinum demonstrates no adenopathy. Esophagus normal. Limited view of the upper abdomen small to moderate moderate size hiatal hernia. Limited view of the skeleton and chest wall is unremarkable. IMPRESSION: Hiatal hernia. Electronically Signed   By: Genevive Bi M.D.   On: 07/27/2023 10:08  VAS US CAROTID Result Date: 07/21/2023 Carotid Arterial Duplex Study Patient Name:  NIVEK DUPUY  Date of Exam:   07/20/2023 Medical Rec #: [id number]      Accession #:    [id number] Date of Birth: [birth year]      Patient Gender: M Patient Age:   20 years Exam Location:  [facility] Procedure:      VAS US CAROTID Referring Phys: Gloris Manchester TURNER --------------------------------------------------------------------------------  Indications:       Carotid artery disease and Patient denies any cerebrovascular                    symptoms today. Comparison Study:  Prior carotid duplex exam on 08/10/2021 showed highest                    velocities in right mid ICA 84/19 cm/s and left Performing Technologist: Alecia Mackin RVT, RDCS (AE), RDMS  Examination Guidelines: A complete evaluation includes B-mode imaging, spectral Doppler, color Doppler, and power Doppler as needed of all accessible portions of each vessel. Bilateral testing is considered an integral part of a complete examination. Limited examinations for reoccurring indications may be performed as noted.  Right Carotid Findings: +----------+--------+--------+--------+---------------------+------------------+           PSV cm/sEDV cm/sStenosisPlaque Description   Comments           +----------+--------+--------+--------+---------------------+------------------+ CCA Prox  48      7                                    tortuous           +----------+--------+--------+--------+---------------------+------------------+ CCA Mid   94       10                                                      +----------+--------+--------+--------+---------------------+------------------+  CCA Distal81      14              focal and calcific   intimal thickening +----------+--------+--------+--------+---------------------+------------------+ ICA Prox  72      23              focal and calcific                      +----------+--------+--------+--------+---------------------+------------------+ ICA Mid   91      17                                                      +----------+--------+--------+--------+---------------------+------------------+ ICA Distal114     29                                                      +----------+--------+--------+--------+---------------------+------------------+ ECA       257     39      >50%    diffuse, irregular   shadowing                                            and calcific                            +----------+--------+--------+--------+---------------------+------------------+ +----------+--------+-------+----------------+-------------------+           PSV cm/sEDV cmsDescribe        Arm Pressure (mmHG) +----------+--------+-------+----------------+-------------------+ [id number]             Multiphasic, [id number]                 +----------+--------+-------+----------------+-------------------+ +---------+--------+--+--------+--+---------+ VertebralPSV cm/s68EDV cm/s10Antegrade +---------+--------+--+--------+--+---------+  Left Carotid Findings: +----------+--------+--------+--------+---------------------+------------------+           PSV cm/sEDV cm/sStenosisPlaque Description   Comments           +----------+--------+--------+--------+---------------------+------------------+ CCA Prox  87      15                                                      +----------+--------+--------+--------+---------------------+------------------+ CCA Mid   81       15                                                      +----------+--------+--------+--------+---------------------+------------------+ CCA Distal115     20              diffuse, smooth and  intimal thickening                                   calcific                                +----------+--------+--------+--------+---------------------+------------------+  ICA Prox  114     31              focal and calcific   Shadowing          +----------+--------+--------+--------+---------------------+------------------+ ICA Mid   77      20                                   intimal thickening +----------+--------+--------+--------+---------------------+------------------+ ICA Distal104     25                                                      +----------+--------+--------+--------+---------------------+------------------+ ECA       130     9               diffuse and calcific intimal thickening +----------+--------+--------+--------+---------------------+------------------+ +----------+--------+--------+----------------+-------------------+           PSV cm/sEDV cm/sDescribe        Arm Pressure (mmHG) +----------+--------+--------+----------------+-------------------+ [id number]             Multiphasic, GMW102                 +----------+--------+--------+----------------+-------------------+ +---------+--------+--+--------+--+---------+ VertebralPSV cm/s51EDV cm/s11Antegrade +---------+--------+--+--------+--+---------+   Summary: Right Carotid: The ECA appears >50% stenosed. The extracranial vessels were                near-normal with only minimal wall thickening or plaque. Left Carotid: The extracranial vessels were near-normal with only minimal wall               thickening or plaque. Vertebrals:  Bilateral vertebral arteries demonstrate antegrade flow. Subclavians: Normal flow hemodynamics were seen in bilateral subclavian               arteries. *See table(s) above for measurements and observations.  Electronically signed by Lorine Bears MD on 07/21/2023 at 1:05:14 PM.    Final     Disposition   Pt is being discharged home today in good condition.  Follow-up Plans & Appointments     Discharge Instructions     Amb Referral to Cardiac Rehabilitation   Complete by: As directed    Diagnosis: Coronary Stents   After initial evaluation and assessments completed: Virtual Based Care may be provided alone or in conjunction with Phase 2 Cardiac Rehab based on patient barriers.: Yes   Intensive Cardiac Rehabilitation (ICR) [facility] location only OR Traditional Cardiac Rehabilitation (TCR) *If criteria for ICR are not met will enroll in TCR [facility] only): Yes        Discharge Medications   Allergies as of 08/17/2023       Reactions   Celecoxib Other (See Comments)   Per Mar   Penicillins Other (See Comments)   Gi upset   Statins    Myalgias on simvastatin 5mg  2x/week, rosuvastatin, pravastatin, and Vytorin   Sulfa Antibiotics Swelling   Sulfamethoxazole Other (See Comments)   "hands and feet web"        Medication List     PAUSE taking these medications    metFORMIN 500 MG tablet Wait to take this until: August 20, 2023 Commonly known as: GLUCOPHAGE Take 500 mg by mouth every evening.       STOP taking these medications    omeprazole 40 MG capsule  Commonly known as: PRILOSEC       TAKE these medications    amLODipine 10 MG tablet Commonly known as: NORVASC TAKE 1 TABLET(10 MG) BY MOUTH DAILY   aspirin EC 81 MG tablet Take 81 mg by mouth daily.   bisoprolol 5 MG tablet Commonly known as: ZEBETA TAKE 1/2 TABLET(2.5 MG) BY MOUTH DAILY   clopidogrel 75 MG tablet Commonly known as: PLAVIX Take 1 tablet (75 mg total) by mouth daily with breakfast. Start taking on: August 18, 2023   CO Q 10 PO Take 1 tablet by mouth daily.   ezetimibe 10 MG tablet Commonly known as: ZETIA Take 1  tablet (10 mg total) by mouth daily.   levothyroxine 75 MCG tablet Commonly known as: SYNTHROID Take 75 mcg by mouth daily before breakfast.   losartan 25 MG tablet Commonly known as: COZAAR Take 25 mg by mouth daily.   multivitamin with minerals Tabs tablet Take 1 tablet by mouth daily.   nitroGLYCERIN 0.4 MG SL tablet Commonly known as: Nitrostat Place 1 tablet (0.4 mg total) under the tongue every 5 (five) minutes as needed.   pantoprazole 40 MG tablet Commonly known as: Protonix Take 1 tablet (40 mg total) by mouth daily.   Repatha SureClick 140 MG/ML Soaj Generic drug: Evolocumab ADMINISTER 1 ML UNDER THE SKIN EVERY 14 DAYS   Vitamin D 50 MCG (2000 UT) tablet Take 2,000 Units by mouth daily.           Allergies Allergies  Allergen Reactions   Celecoxib Other (See Comments)    Per Mar   Penicillins Other (See Comments)    Gi upset    Statins     Myalgias on simvastatin 5mg  2x/week, rosuvastatin, pravastatin, and Vytorin   Sulfa Antibiotics Swelling   Sulfamethoxazole Other (See Comments)    "hands and feet web"    Outstanding Labs/Studies   N/a   Duration of Discharge Encounter   Greater than 30 minutes including physician time.  Signed, Laverda Page, NP 08/17/2023, 12:34 PM

## 2023-08-17 NOTE — Progress Notes (Addendum)
Pt was educated on stent card, stent location, Antiplatelet and ASA use, wt restrictions, no baths/daily wash-ups, s/s of infection, ex guidelines, s/s to stop exercising, NTG use and calling 911, heart healthy diet, risk factors and CRPII. Pt received materials on exercise, diet, and CRPII. Will refer to [facility].    Pt interested in CR.   Faustino Congress, MS ACSM-CEP  08/17/2023 11:12 AM

## 2023-08-17 NOTE — Interval H&P Note (Signed)
History and Physical Interval Note:  08/17/2023 7:18 AM  Hayden [name]  has presented today for surgery, with the diagnosis of abmoral stress test.  The various methods of treatment have been discussed with the patient and family. After consideration of risks, benefits and other options for treatment, the patient has consented to  Procedure(s): LEFT HEART CATH AND CORS/GRAFTS ANGIOGRAPHY (N/A) as a surgical intervention.  The patient's history has been reviewed, patient examined, no change in status, stable for surgery.  I have reviewed the patient's chart and labs.  Questions were answered to the patient's satisfaction.    Cath Lab Visit (complete for each Cath Lab visit)  Clinical Evaluation Leading to the Procedure:   ACS: No.  Non-ACS:    Anginal Classification: No Symptoms  Anti-ischemic medical therapy: Maximal Therapy (2 or more classes of medications)  Non-Invasive Test Results: High-risk stress test findings: cardiac mortality >3%/year  Prior CABG: Previous CABG        Verne Carrow

## 2023-08-17 NOTE — Progress Notes (Signed)
Up and walked and tolerated well; Laverda Page, NP in to see client and ok to d/c home

## 2023-08-18 ENCOUNTER — Encounter (HOSPITAL_COMMUNITY): Payer: Self-pay | Admitting: Cardiovascular Disease

## 2023-08-22 ENCOUNTER — Telehealth (HOSPITAL_COMMUNITY): Payer: Self-pay

## 2023-08-22 NOTE — Telephone Encounter (Signed)
Called patient to see if he is interested in the Cardiac Rehab Program. Patient expressed interest. Explained scheduling process and went over insurance, patient verbalized understanding. Will contact patient for scheduling once f/u has been completed.  °

## 2023-08-23 ENCOUNTER — Telehealth: Payer: Self-pay | Admitting: Cardiology

## 2023-08-23 ENCOUNTER — Telehealth (HOSPITAL_COMMUNITY): Payer: Self-pay | Admitting: *Deleted

## 2023-08-23 NOTE — Telephone Encounter (Signed)
Pt said he got a call from cardiac rehab and he said no one told him anything about this. He also wants to know what Dr Mayford Knife thinks about him having a stent.

## 2023-08-23 NOTE — Telephone Encounter (Signed)
Left message to call back. What does she think?...what is the question? Cardiac rehab should have been discussed during admission or at d/c. Looks like he started cardiac rehab while in the hospital and was to continue Phase 2 after d/c.

## 2023-08-23 NOTE — Telephone Encounter (Signed)
Spoke with pt he states that he is a little hesitant to start his "regular" activities (golf, etc) after the stent placement. He states that this was NOT discussed with him at discharge from the hospital (his exercise level after stent). Informed pt that he can restart all his previous activities. He will call if he has any cardiac sx, SOB, etc. He will start cardiac rehab as directed. He will call them to schedule.

## 2023-08-23 NOTE — Telephone Encounter (Signed)
Message left on departmental voicemail for cardiac rehab.  Pt received text message to schedule for CR.  Spoke to pt that the text messages are generated from a third party and not from CR.  Explained the process of for scheduling after the successful completion of his follow up appt on 1/10. Pt verbalized understanding. Alanson Aly, BSN Cardiac and Emergency planning/management officer

## 2023-08-26 DIAGNOSIS — G720 Drug-induced myopathy: Secondary | ICD-10-CM | POA: Diagnosis not present

## 2023-08-26 DIAGNOSIS — E039 Hypothyroidism, unspecified: Secondary | ICD-10-CM | POA: Diagnosis not present

## 2023-08-26 DIAGNOSIS — I5032 Chronic diastolic (congestive) heart failure: Secondary | ICD-10-CM | POA: Diagnosis not present

## 2023-08-26 DIAGNOSIS — E785 Hyperlipidemia, unspecified: Secondary | ICD-10-CM | POA: Diagnosis not present

## 2023-08-26 DIAGNOSIS — I110 Hypertensive heart disease with heart failure: Secondary | ICD-10-CM | POA: Diagnosis not present

## 2023-08-26 DIAGNOSIS — Z85820 Personal history of malignant melanoma of skin: Secondary | ICD-10-CM | POA: Diagnosis not present

## 2023-08-26 DIAGNOSIS — Z1331 Encounter for screening for depression: Secondary | ICD-10-CM | POA: Diagnosis not present

## 2023-08-26 DIAGNOSIS — E119 Type 2 diabetes mellitus without complications: Secondary | ICD-10-CM | POA: Diagnosis not present

## 2023-08-26 DIAGNOSIS — I2510 Atherosclerotic heart disease of native coronary artery without angina pectoris: Secondary | ICD-10-CM | POA: Diagnosis not present

## 2023-08-26 DIAGNOSIS — Z0000 Encounter for general adult medical examination without abnormal findings: Secondary | ICD-10-CM | POA: Diagnosis not present

## 2023-08-26 DIAGNOSIS — F1121 Opioid dependence, in remission: Secondary | ICD-10-CM | POA: Diagnosis not present

## 2023-09-01 ENCOUNTER — Ambulatory Visit (HOSPITAL_COMMUNITY): Payer: PPO | Attending: Cardiology

## 2023-09-01 DIAGNOSIS — I249 Acute ischemic heart disease, unspecified: Secondary | ICD-10-CM | POA: Diagnosis not present

## 2023-09-01 DIAGNOSIS — E785 Hyperlipidemia, unspecified: Secondary | ICD-10-CM | POA: Insufficient documentation

## 2023-09-01 DIAGNOSIS — I110 Hypertensive heart disease with heart failure: Secondary | ICD-10-CM | POA: Diagnosis not present

## 2023-09-01 DIAGNOSIS — I340 Nonrheumatic mitral (valve) insufficiency: Secondary | ICD-10-CM | POA: Insufficient documentation

## 2023-09-01 DIAGNOSIS — I509 Heart failure, unspecified: Secondary | ICD-10-CM | POA: Diagnosis not present

## 2023-09-01 DIAGNOSIS — R9439 Abnormal result of other cardiovascular function study: Secondary | ICD-10-CM | POA: Insufficient documentation

## 2023-09-01 DIAGNOSIS — I2510 Atherosclerotic heart disease of native coronary artery without angina pectoris: Secondary | ICD-10-CM | POA: Diagnosis not present

## 2023-09-01 LAB — ECHOCARDIOGRAM COMPLETE
Area-P 1/2: 3.48 cm2
S' Lateral: 3.6 cm

## 2023-09-04 ENCOUNTER — Encounter: Payer: Self-pay | Admitting: Cardiology

## 2023-09-04 DIAGNOSIS — I77810 Thoracic aortic ectasia: Secondary | ICD-10-CM | POA: Insufficient documentation

## 2023-09-09 ENCOUNTER — Ambulatory Visit: Payer: PPO | Admitting: Cardiology

## 2023-09-13 ENCOUNTER — Telehealth: Payer: Self-pay

## 2023-09-13 DIAGNOSIS — I5032 Chronic diastolic (congestive) heart failure: Secondary | ICD-10-CM

## 2023-09-13 NOTE — Telephone Encounter (Signed)
-----   Message from Hayden [name] sent at 09/04/2023  8:26 PM EST ----- Echo showed mildly reduced LVF EF 40-45% (slightly decreased from 45-50% in 2022), increased stiffness of heart muscle called diastolic dysfunction, mildly enlarged LA, mildly leaky MV, calcified AV with no stenosis and borderline dilated aortic root at 38mm.  He has since had revascularization 08/17/2023 - repeat echo in 2 months to see if EF has improved.

## 2023-09-13 NOTE — Telephone Encounter (Signed)
 Call to patient to review Echo results. Patient verbalizes understanding of mildly reduced LVF EF 40-45%, increased stiffness of heart muscle, mildly enlarged LA, mildly leaky MV, calcified AV with no stenosis and borderline dilated aortic root at 38mm. Patient agrees to repeat echo in 2 months to check EF-order placed.

## 2023-09-14 ENCOUNTER — Other Ambulatory Visit (HOSPITAL_BASED_OUTPATIENT_CLINIC_OR_DEPARTMENT_OTHER): Payer: Self-pay

## 2023-09-15 ENCOUNTER — Other Ambulatory Visit (HOSPITAL_BASED_OUTPATIENT_CLINIC_OR_DEPARTMENT_OTHER): Payer: Self-pay

## 2023-09-16 ENCOUNTER — Other Ambulatory Visit (HOSPITAL_BASED_OUTPATIENT_CLINIC_OR_DEPARTMENT_OTHER): Payer: Self-pay

## 2023-09-17 ENCOUNTER — Other Ambulatory Visit (HOSPITAL_BASED_OUTPATIENT_CLINIC_OR_DEPARTMENT_OTHER): Payer: Self-pay

## 2023-09-17 MED ORDER — ICOSAPENT ETHYL 1 G PO CAPS
2.0000 g | ORAL_CAPSULE | Freq: Two times a day (BID) | ORAL | 3 refills | Status: DC
  Filled 2023-09-17: qty 360, 90d supply, fill #0

## 2023-09-17 MED ORDER — LEVOTHYROXINE SODIUM 75 MCG PO TABS
75.0000 ug | ORAL_TABLET | Freq: Every morning | ORAL | 4 refills | Status: DC
  Filled 2023-09-17: qty 90, 90d supply, fill #0

## 2023-09-17 MED ORDER — LOSARTAN POTASSIUM 25 MG PO TABS
25.0000 mg | ORAL_TABLET | Freq: Every day | ORAL | 4 refills | Status: DC
  Filled 2023-09-17: qty 90, 90d supply, fill #0

## 2023-09-17 MED ORDER — METFORMIN HCL ER 500 MG PO TB24
1000.0000 mg | ORAL_TABLET | Freq: Every day | ORAL | 4 refills | Status: DC
  Filled 2023-09-17: qty 180, 90d supply, fill #0

## 2023-09-17 MED ORDER — METFORMIN HCL ER 500 MG PO TB24
500.0000 mg | ORAL_TABLET | Freq: Every evening | ORAL | 3 refills | Status: DC
  Filled 2023-09-17: qty 30, 30d supply, fill #0

## 2023-09-17 MED ORDER — OMEPRAZOLE 40 MG PO CPDR
40.0000 mg | DELAYED_RELEASE_CAPSULE | Freq: Every day | ORAL | 3 refills | Status: DC
  Filled 2023-09-17: qty 90, 90d supply, fill #0

## 2023-09-17 MED FILL — Evolocumab Subcutaneous Soln Auto-Injector 140 MG/ML: SUBCUTANEOUS | 28 days supply | Qty: 2 | Fill #0 | Status: CN

## 2023-09-17 MED FILL — Amlodipine Besylate Tab 10 MG (Base Equivalent): ORAL | 90 days supply | Qty: 90 | Fill #0 | Status: AC

## 2023-09-17 MED FILL — Bisoprolol Fumarate Tab 5 MG: ORAL | 90 days supply | Qty: 45 | Fill #0 | Status: CN

## 2023-09-18 ENCOUNTER — Other Ambulatory Visit: Payer: Self-pay

## 2023-09-19 ENCOUNTER — Telehealth: Payer: Self-pay | Admitting: Cardiology

## 2023-09-19 ENCOUNTER — Other Ambulatory Visit (HOSPITAL_BASED_OUTPATIENT_CLINIC_OR_DEPARTMENT_OTHER): Payer: Self-pay

## 2023-09-19 MED ORDER — TICAGRELOR 90 MG PO TABS
90.0000 mg | ORAL_TABLET | Freq: Two times a day (BID) | ORAL | 11 refills | Status: DC
  Filled 2023-09-19: qty 60, 30d supply, fill #0

## 2023-09-19 NOTE — Telephone Encounter (Signed)
Lm to call back ./cy 

## 2023-09-19 NOTE — Telephone Encounter (Signed)
Left message for patient to call back  

## 2023-09-19 NOTE — Telephone Encounter (Signed)
Pt c/o medication issue:  1. Name of Medication:   clopidogrel (PLAVIX) 75 MG tablet    2. How are you currently taking this medication (dosage and times per day)?    3. Are you having a reaction (difficulty breathing--STAT)? no  4. What is your medication issue? Patient is calling wanting to speak with dr. Mayford Knife (only) about the medication. He states that he messing with his stomach. Please advise

## 2023-09-19 NOTE — Telephone Encounter (Signed)
Patient will come by office to get Brilinta 30 day free coupon. Sent prescription to patient's pharmacy of choice.

## 2023-09-19 NOTE — Telephone Encounter (Signed)
Call came straight to triage. Patient stated that Plavix is causing him to have upset stomach and diarrhea. Patient stated he has lost 4 pounds due to diarrhea. Patient stated he cannot take Plavix and he did not take his dose today. Patient had heart cath on 08/16/24. Patient stated he felt great until after his cath, now he is feeling awful. Patient would like Dr. Mayford Knife know what is going on and would be happy to speak with a pharmacist if possible.

## 2023-09-19 NOTE — Telephone Encounter (Signed)
Pt returning call

## 2023-09-22 ENCOUNTER — Other Ambulatory Visit (HOSPITAL_BASED_OUTPATIENT_CLINIC_OR_DEPARTMENT_OTHER): Payer: Self-pay

## 2023-09-23 NOTE — Progress Notes (Deleted)
 Cardiology Office Note:  .   Date:  09/23/2023  ID:  Hayden [name], DOB [birth year], MRN [id number] PCP: Hayden James, MD  [facility] Providers Cardiologist:  Armanda Magic, MD    History of Present Illness: .   Hayden [name] is a 78 y.o. male with past medical history of coronary artery disease, hypertension, hyperlipidemia, chronic diastolic heart failure, carotid artery disease.  Followed by Dr. Mayford Knife, presents today for follow-up after PCI.  Per chart review, patient previously had an MI in 1994 and was treated with PTCA of the left circumflex.  Later in 2000, patient underwent CABG.  He underwent echocardiogram in 10/2019 that showed EF 45-50% with regional wall motion abnormalities, mild LVH, grade 2 diastolic dysfunction, normal RV function, mild mitral valve regurgitation.  More recently, echocardiogram from 09/2020 showed EF 45-50% with regional wall motion abnormalities, grade 2 diastolic dysfunction, low normal RV systolic function, mild mitral valve regurgitation.   Patient was seen by Dr. Mayford Knife in 05/2023.  He was asymptomatic at the time, but had new anterior T wave changes on EKG.  He was sent for cardiac PET that was completed 07/27/2023.  Showed findings consistent with ischemia in the distribution of the native right coronary artery and proximal LAD artery.  He was sent for heart catheterization was completed 08/17/2023 and showed severe triple-vessel CAD status post three-vessel CABG with 2/3 patent bypass graft.  There was chronic occlusion of the ostial LAD.  Patent LIMA-mid LAD.  Severe ostial circumflex stenosis followed by chronic total occlusion of the mid circumflex.  SVG to the distal obtuse marginal branch had severe stenosis in the proximal body.  There is chronic occlusion of ostial RCA, graft to PDA known to be occluded.  Patient underwent successful PTCA/DES x 1 to the proximal body of SVG-OM.  Patient was discharged same day as the procedure.  Underwent outpatient  echocardiogram on 09/05/2023 that showed EF 40-45% with regional wall motion abnormalities, grade 1 diastolic dysfunction, mild MR.  CAD  S/p CABG  -Patient previously had CABG in 2000.  In 05/2023, noted to have new anterior T wave changes on EKG.  Was sent for cardiac PET that was abnormal.  Underwent cardiac catheterization on 08/17/2023.  Found to have severe triple-vessel CAD s/p 3V CABG with 2/3 patent bypass grafts.  LIMA-mid LAD was patent, SVG-PDA known to be occluded, severe stenosis in the proximal body of SVG-OM.  Underwent successful PTCA/DES x 1 to proximal body of SVG-OM -Continue DAPT with aspirin, Brilinta for at least 6 months uninterrupted. Patient initially on plavix, but had stomach upset and diarrhea -Continue Repatha, Zetia -Continue bisoprolol 2.5 mg daily, amlodipine 10 mg daily  Chronic HFmrEF  Mild MR -Post recent echocardiogram from 09/05/2023 showed EF 40-45% with regional wall motion abnormalities, mild MR.  Patient was revascularized in mid December.  Planning to repeat echocardiogram in 2 months. - Echocardiogram is already been ordered by Dr. Timmothy Euler for 11/09/2023 - Continue bisoprolol 2.5 mg daily, losartan 25 mg daily - SGLT2i?  HTN  - BP well controlled  - Creatinine 0.80 and K 4.3 on 08/15/23  HLD  Statin intolerance  - Lipid panel from 05/2023 showed LDL 24, HDL 35, triglycerides 167, total cholesterol 87 - Continue repatha  - Continue zetia 10 mg daily   Bilateral Carotid Artery Stenosis  -Previously, carotid ultrasounds in 07/2021 showed 1-39% stenosis bilateral ICAs.  More recently, carotid ultrasounds from 07/2023 showed no evidence of internal carotid artery stenosis -Continue aspirin  81 mg daily, Repatha, Zetia  ROS: ***  Studies Reviewed: .        *** Risk Assessment/Calculations:   {Does this patient have ATRIAL FIBRILLATION?:[phone]} No BP recorded.  {Refresh Note OR Click here to enter BP  :1}***       Physical Exam:    VS:  There were no vitals taken for this visit.   Wt Readings from Last 3 Encounters:  08/17/23 161 lb (73 kg)  08/05/23 166 lb 12.8 oz (75.7 kg)  06/27/23 165 lb (74.8 kg)    GEN: Well nourished, well developed in no acute distress NECK: No JVD; No carotid bruits CARDIAC: ***RRR, no murmurs, rubs, gallops RESPIRATORY:  Clear to auscultation without rales, wheezing or rhonchi  ABDOMEN: Soft, non-tender, non-distended EXTREMITIES:  No edema; No deformity   ASSESSMENT AND PLAN: .   *** {The patient has an active order for outpatient cardiac rehabilitation.   Please indicate if the patient is ready to start. Do NOT delete this.  It will auto delete.  Refresh note, then sign.              Click here to document readiness and see contraindications.  :1}  Cardiac Rehabilitation Eligibility Assessment      {Are you ordering a CV Procedure (e.g. stress test, cath, DCCV, TEE, etc)?   Press F2        :[id number]}  Dispo: ***  Signed, Jonita Albee, PA-C

## 2023-09-26 ENCOUNTER — Encounter: Payer: Self-pay | Admitting: Nurse Practitioner

## 2023-09-26 ENCOUNTER — Other Ambulatory Visit (HOSPITAL_BASED_OUTPATIENT_CLINIC_OR_DEPARTMENT_OTHER): Payer: Self-pay

## 2023-09-26 ENCOUNTER — Ambulatory Visit: Payer: PPO | Attending: Nurse Practitioner | Admitting: Nurse Practitioner

## 2023-09-26 VITALS — BP 132/58 | HR 66 | Ht 67.5 in | Wt 162.8 lb

## 2023-09-26 DIAGNOSIS — I2510 Atherosclerotic heart disease of native coronary artery without angina pectoris: Secondary | ICD-10-CM

## 2023-09-26 DIAGNOSIS — E785 Hyperlipidemia, unspecified: Secondary | ICD-10-CM | POA: Diagnosis not present

## 2023-09-26 DIAGNOSIS — G720 Drug-induced myopathy: Secondary | ICD-10-CM

## 2023-09-26 DIAGNOSIS — T466X5D Adverse effect of antihyperlipidemic and antiarteriosclerotic drugs, subsequent encounter: Secondary | ICD-10-CM

## 2023-09-26 DIAGNOSIS — I10 Essential (primary) hypertension: Secondary | ICD-10-CM

## 2023-09-26 DIAGNOSIS — Z79899 Other long term (current) drug therapy: Secondary | ICD-10-CM

## 2023-09-26 DIAGNOSIS — R000 Tachycardia, unspecified: Secondary | ICD-10-CM

## 2023-09-26 MED ORDER — HYOSCYAMINE SULFATE 0.125 MG PO TABS
0.1250 mg | ORAL_TABLET | ORAL | 1 refills | Status: DC | PRN
  Filled 2023-09-26: qty 30, 5d supply, fill #0

## 2023-09-26 MED ORDER — CLOPIDOGREL BISULFATE 75 MG PO TABS: 75.0000 mg | ORAL_TABLET | Freq: Every day | ORAL | Status: DC

## 2023-09-26 NOTE — Progress Notes (Signed)
Cardiology Office Note:  .   Date:  09/26/2023  ID:  [name], DOB 1946-08-30, MRN [id number] PCP: Deatra James, MD  [facility] Providers Cardiologist:  Armanda Magic, MD    Patient Profile: .      PMH Coronary artery disease S/p MI in 1994 with PTCA of LCx S/p CAG x 3 in 2000 Abnormal PET CT 07/27/2023 LHC 08/17/23 (outlined below) Successful PTCA/DES x 1 to SVG to OM Hypertension Hyperlipidemia LDL goal < 55       History of Present Illness: .   Hayden [name] is a very pleasant 78 y.o. male who is seen today as a late add-on to my schedule for concerning side effects with Plavix.  By Dr. Mayford Knife on 06/27/2023 with new anterior T wave changes on EKG.  Despite being asymptomatic, she recommended stress PET CT due to no recent ischemia eval. PET CT was abnormal with evidence of ischemia and reduced EF concerning for worsening ischemia.  He underwent LHC on 08/17/2023 which revealed severe triple-vessel CAD s/p 3V CABG with 2/3 patent bypass grafts, chronic occlusion of ostial LAD with patent LIMA to mid LAD, severe stenosis of the proximal body of the vein graft of SVG to OM successfully treated with PTCA/DES x 1. He reports feeling worse since the procedure; no chest pain or shortness of breath. Reports he also did not have those symptoms prior to the cath. However, since then, he has noticed an increase in his resting heart rate, ranging from 65 to 115 beats per minute, compared to his usual 50-65 beats per minute. He also reports feeling more sluggish than usual and has had significant stomach upset since starting Plavix.  He attempted to switch to Brilinta but found it to be prohibitively expensive and associated with more side effects. He has since been halving his Plavix dose, but this continues to upset his stomach. He expresses interest in trying lecithin or hyoscyamine to alleviate the stomach issues. He reports episodes of tachycardia with minimal exertion, such as walking  from his car to the church, which previously did not elevate his heart rate. He has been monitoring his heart rate with an Apple watch, which has not indicated any irregular rhythms.  He denies palpitations, orthopnea, PND, edema, presyncope, syncope.  He initially thought gastric distress may be related to prednisone which he was taking for shoulder pain but symptoms persisted after stopping it.  Plavix is the only new medication with the exception of Protonix since hospital discharge, however the stomach upset improved when he stopped Plavix.  Discussed the use of AI scribe software for clinical note transcription with the patient, who gave verbal consent to proceed.   ROS: See HPI       Studies Reviewed: Marland Kitchen       LHC 08/17/2023  Prox RCA to Dist RCA lesion is 100% stenosed.   Mid Cx lesion is 100% stenosed.   Ost Cx to Prox Cx lesion is 99% stenosed.   Dist LM to Prox LAD lesion is 100% stenosed.   Origin to Prox Graft lesion is 80% stenosed.   A drug-eluting stent was successfully placed using a SYNERGY XD 3.0X16.   Post intervention, there is a 0% residual stenosis.   LIMA graft was visualized by angiography and is normal in caliber.   SVG graft was visualized by angiography and is normal in caliber.   Severe triple vessel CAD s/p 3V CABG with 2/3 patent bypass grafts Chronic occlusion of the  ostial LAD. Patent LIMA to mid LAD Severe ostial Circumflex stenosis followed by chronic total occlusion of the mid Circumflex. Patent vein graft to the distal obtuse marginal branch that fills the entire Circumflex and gives collaterals to the RCA. Severe stenosis proximal body of the vein graft.  Chronic occlusion ostial RCA. The distal RCA fills from left to right collaterals supplied by flow from the vein graft through the Circumflex. The graft to the PDA is known to be occluded.  Successful PTCA/DES x 1 proximal body of SVG to OM Preserved LV systolic function LVEDP 15 mmHg    Recommendations: Will continue DAPT with ASA and Plavix for at least six months. Same day post PCI discharge in 4 hours. Will change Prilosec to Protonix given the use of Plavix.     Risk Assessment/Calculations:             Physical Exam:   VS:  BP (!) 132/58   Pulse 66   Ht 5' 7.5" (1.715 m)   Wt 162 lb 12.8 oz (73.8 kg)   SpO2 97%   BMI 25.12 kg/m    Wt Readings from Last 3 Encounters:  09/26/23 162 lb 12.8 oz (73.8 kg)  08/17/23 161 lb (73 kg)  08/05/23 166 lb 12.8 oz (75.7 kg)    GEN: Well nourished, well developed in no acute distress NECK: No JVD; No carotid bruits CARDIAC: RRR, no murmurs, rubs, gallops RESPIRATORY:  Clear to auscultation without rales, wheezing or rhonchi  ABDOMEN: Soft, non-tender, non-distended EXTREMITIES:  No edema; No deformity     ASSESSMENT AND PLAN: .    CAD without angina: History of CABG times 10/1998, recent abnormal PET/CT with subsequent LHC 08/17/2023 which revealed severe stenosis of the proximal body of the SVG to OM successfully treated with PTCA/DES x 1. He reports since hospital discharge he has been burdened by GI distress he feels was caused by Plavix. He denies chest pain, shortness of breath, or other symptoms concerning for angina.  At time of discharge, his only new medications were Plavix and pantoprazole and GI symptoms subsided once he stopped Plavix. He was advised to change to Brilinta, however he reports he is not willing to pay for that medication. He also reports at times his HR has been elevated. No sustained episodes of tachycardia or irregular HR noted on Apple Watch. May be secondary to anxiety and GI distress.  We had a lengthy discussion about the importance of DAPT post DES.  He is willing to retry Plavix. He was advised to take Plavix with his heaviest meal. He discussed his symptoms with a friend who is a Teacher, early years/pre and asks if he can take hycosamine along with the Plavix.  I encouraged him to first try taking alone  with food and then use hycosamine as needed up to 4 times per day.  Advised him to call if symptoms do not improve.  Per Malena Peer, RPH, prasugrel may be financially more feasible option then Brilinta. If he does not tolerate Plavix, we will consider Prasugrel. He asks about cardiac rehab. Advised that I will send a message asking them to follow-up with him.   Tachycardia: He reports more frequent episodes of tachycardia since coronary stent 08/17/2023. No irregular rhythms per Apple Watch and no sustained episodes of significant tachycardia.  Symptoms may be secondary to anxiety and GI distress associated with DAPT as noted above. He has not been as active as he was prior to cath.  He plans to resume  exercise either at his regular gym or with cardiac rehab.  Encouraged him to notify us if tachycardia persists.  Medication management: Reports stomach upset with Plavix, leading to discontinuation. Brilinta was suggested but not started due to cost and potential side effects. Patient willing to retry Plavix with a GI aid. He was also encouraged to take Plavix with heaviest meal. Notify us if symptoms do not improve.   Hyperlipidemia LDL goal < 55: Most recent lipid panel 05/18/2023 with total cholesterol 87, triglycerides 167, HDL 35, and LDL 24. History of statin intolerance. He has been on Repatha for years and has not had any concerning side effects.  He is also on Vascepa.  He reports he has improved his diet and is eating less overall and incorporating more fresh fruits and vegetables into his diet.   Hypertension: BP is well controlled today.  Renal function stable on labs completed 08/15/2023.  No change in antihypertensive therapy today.    Cardiac Rehabilitation Eligibility Assessment  The patient is ready to start cardiac rehabilitation from a cardiac standpoint.       Disposition: 3 months with Dr. Mayford Knife  Signed, Eligha Bridegroom, NP-C

## 2023-09-26 NOTE — Patient Instructions (Addendum)
Medication Instructions:   DISCONTINUE Brilinta   RESTART Plavix one (1) tablet by mouth ( 75 mg) daily. Try Taking with heaviest meal.   START Levsin one (1) tablet by mouth ( 0.125 mg) as needed up to 4 times daily for diaharrea or cramping.   *If you need a refill on your cardiac medications before your next appointment, please call your pharmacy*   Lab Work:  None ordered.  If you have labs (blood work) drawn today and your tests are completely normal, you will receive your results only by: MyChart Message (if you have MyChart) OR A paper copy in the mail If you have any lab test that is abnormal or we need to change your treatment, we will call you to review the results.   Testing/Procedures:  Keep echo appointment.   Follow-Up: At [facility], you and your health needs are our priority.  As part of our continuing mission to provide you with exceptional heart care, we have created designated Provider Care Teams.  These Care Teams include your primary Cardiologist (physician) and Advanced Practice Providers (APPs -  Physician Assistants and Nurse Practitioners) who all work together to provide you with the care you need, when you need it.  We recommend signing up for the patient portal called "MyChart".  Sign up information is provided on this After Visit Summary.  MyChart is used to connect with patients for Virtual Visits (Telemedicine).  Patients are able to view lab/test results, encounter notes, upcoming appointments, etc.  Non-urgent messages can be sent to your provider as well.   To learn more about what you can do with MyChart, go to [email or web address].    Your next appointment:   3 month(s)  Provider:   Armanda Magic, MD     Other Instructions         1st Floor: - Lobby - Registration  - Pharmacy  - Lab - Cafe  2nd Floor: - PV Lab - Diagnostic Testing (echo, CT, nuclear med)  3rd Floor: - Vacant  4th Floor: - TCTS (cardiothoracic  surgery) - AFib Clinic - Structural Heart Clinic - Vascular Surgery  - Vascular Ultrasound  5th Floor: - HeartCare Cardiology (general and EP) - Clinical Pharmacy for coumadin, hypertension, lipid, weight-loss medications, and med management appointments    Valet parking services will be available as well.

## 2023-09-27 ENCOUNTER — Other Ambulatory Visit (HOSPITAL_BASED_OUTPATIENT_CLINIC_OR_DEPARTMENT_OTHER): Payer: Self-pay

## 2023-09-27 ENCOUNTER — Telehealth: Payer: Self-pay | Admitting: Nurse Practitioner

## 2023-09-27 MED FILL — Evolocumab Subcutaneous Soln Auto-Injector 140 MG/ML: SUBCUTANEOUS | 28 days supply | Qty: 2 | Fill #0 | Status: CN

## 2023-09-27 NOTE — Telephone Encounter (Signed)
Spoke with patient, he states he had spoken with Eligha Bridegroom yesterday at his appt about his heart rate but couldn't remember what she said to do. Patient states his heart rate is becoming more elevated than normal with activity (120's) but does recover well when at rest (70-80's). Patient states he hasn't been as active since heart cath but will plan to incorporate exercising again soon. Per Swinyer's office notes, patient to contact office if this doesn't improve. Patient thankful for the call back. No further needs at this time

## 2023-09-27 NOTE — Telephone Encounter (Signed)
Pt had an office visit yesterday and forgot to mention a few things so he'd like to speak with a nurse. Please advise.

## 2023-09-27 NOTE — Telephone Encounter (Signed)
Patient is returning call. Requesting return call.

## 2023-09-27 NOTE — Telephone Encounter (Signed)
Left a message to call back.

## 2023-09-28 ENCOUNTER — Telehealth (HOSPITAL_COMMUNITY): Payer: Self-pay

## 2023-09-28 NOTE — Telephone Encounter (Signed)
Called and spoke with pt in regards to CR, pt wanted to know why are we (CR) just now calling to get him schedule. Adv pt he just completed his f/u appt on 1/28 and was clear. Pt stated "well I am just gone call you back". Ask pt again was he interested in CR and he stated again " I will call you back".   Placed pt ppw in 2 week bin, if no response from pt within 2 weeks will close referral.

## 2023-09-29 ENCOUNTER — Telehealth (HOSPITAL_COMMUNITY): Payer: Self-pay

## 2023-09-29 ENCOUNTER — Ambulatory Visit: Payer: PPO | Admitting: Cardiology

## 2023-09-29 NOTE — Telephone Encounter (Signed)
Returned pt phone call in regards CR, pt stated he is not able to commit to CR at this time and he will continue to exercise at his gym.   Closed referral

## 2023-10-10 ENCOUNTER — Other Ambulatory Visit (HOSPITAL_BASED_OUTPATIENT_CLINIC_OR_DEPARTMENT_OTHER): Payer: Self-pay

## 2023-10-14 ENCOUNTER — Other Ambulatory Visit (HOSPITAL_BASED_OUTPATIENT_CLINIC_OR_DEPARTMENT_OTHER): Payer: Self-pay

## 2023-10-17 ENCOUNTER — Other Ambulatory Visit (HOSPITAL_BASED_OUTPATIENT_CLINIC_OR_DEPARTMENT_OTHER): Payer: Self-pay

## 2023-10-17 MED ORDER — LEVOTHYROXINE SODIUM 75 MCG PO TABS
75.0000 ug | ORAL_TABLET | Freq: Every morning | ORAL | 3 refills | Status: DC
  Filled 2023-10-17: qty 90, 90d supply, fill #0
  Filled 2024-01-10: qty 90, 90d supply, fill #1
  Filled 2024-04-11 (×2): qty 90, 90d supply, fill #2

## 2023-10-20 ENCOUNTER — Other Ambulatory Visit (HOSPITAL_BASED_OUTPATIENT_CLINIC_OR_DEPARTMENT_OTHER): Payer: Self-pay

## 2023-10-20 ENCOUNTER — Other Ambulatory Visit: Payer: Self-pay | Admitting: Pharmacist

## 2023-10-20 MED ORDER — BISOPROLOL FUMARATE 5 MG PO TABS
2.5000 mg | ORAL_TABLET | Freq: Every day | ORAL | 1 refills | Status: DC
  Filled 2023-10-20: qty 45, 90d supply, fill #0
  Filled 2024-01-23: qty 45, 90d supply, fill #1

## 2023-10-23 ENCOUNTER — Other Ambulatory Visit (HOSPITAL_BASED_OUTPATIENT_CLINIC_OR_DEPARTMENT_OTHER): Payer: Self-pay

## 2023-10-23 MED FILL — Evolocumab Subcutaneous Soln Auto-Injector 140 MG/ML: SUBCUTANEOUS | 28 days supply | Qty: 2 | Fill #0 | Status: AC

## 2023-10-24 ENCOUNTER — Telehealth: Payer: Self-pay | Admitting: Cardiology

## 2023-10-24 ENCOUNTER — Other Ambulatory Visit (HOSPITAL_COMMUNITY): Payer: Self-pay

## 2023-10-24 ENCOUNTER — Telehealth: Payer: Self-pay | Admitting: Pharmacy Technician

## 2023-10-24 ENCOUNTER — Other Ambulatory Visit (HOSPITAL_BASED_OUTPATIENT_CLINIC_OR_DEPARTMENT_OTHER): Payer: Self-pay

## 2023-10-24 NOTE — Telephone Encounter (Signed)
 Called patient back about his message. Patient stated his HR is jumping all around regularly from 60 to 110. Patient stated that it just feels like his HR is all over the place. Patient stated yesterday his HR dropped to 41 for about 1 minute, BP 138/68, then went back to 70. Patient stated he just has not felt right since his heart cath, more fatigue and lightheaded at times. Will send message to Eligha Bridegroom, NP since patient was last seen by her.

## 2023-10-24 NOTE — Telephone Encounter (Signed)
 STAT if HR is under 50 or over 120 (normal HR is 60-100 beats per minute)  What is your heart rate? 40's-110's since December when pt has stent put in  Do you have a log of your heart rate readings (document readings)? No  Do you have any other symptoms? Have felt a little fatigue and does get a little lightheaded when its in the 40's. Pt stated before he had stent put in he wasn't having any symptoms. Please advise

## 2023-10-24 NOTE — Telephone Encounter (Signed)
 Pharmacy Patient Advocate Encounter   Received notification from CoverMyMeds that prior authorization for Repatha is required/requested.   Insurance verification completed.   The patient is insured through [facility] ADVANTAGE/RX ADVANCE .   Per test claim: PA required; PA submitted to above mentioned insurance via CoverMyMeds Key/confirmation #/EOC [id number] Status is pending

## 2023-10-25 ENCOUNTER — Other Ambulatory Visit (HOSPITAL_COMMUNITY): Payer: Self-pay

## 2023-10-25 ENCOUNTER — Other Ambulatory Visit (HOSPITAL_BASED_OUTPATIENT_CLINIC_OR_DEPARTMENT_OTHER): Payer: Self-pay

## 2023-10-25 NOTE — Telephone Encounter (Signed)
 Please ask Hayden [name] to provide some additional details including:  What is his HR when he is exercising? When HR is in the 40s, does he feel lightheaded or like he might pass out? Can he record 2-3 days worth of HR and BP readings at various times of the day and also record what time he takes his medication.   Thank you, Marcelino Duster

## 2023-10-25 NOTE — Telephone Encounter (Signed)
 Pharmacy Patient Advocate Encounter  Received notification from [facility] ADVANTAGE/RX ADVANCE that Prior Authorization for repatha has been APPROVED from 10/24/23 to 10/23/24. Spoke to pharmacy to process.Copay is $0.00.    PA #/Case ID/Reference #: [id number]

## 2023-10-25 NOTE — Telephone Encounter (Signed)
 Spoke with patient and he states his HR was only in the 40's one tine for 1-3 mins and went back up to the 60's. He  states he did feel a  little lightheaded. His concern is how it jumps around from the 60's to th 90's. He will monitor his HR and BP for 3 days and give [place] a call with readings.  While exercising he states his HR ranges from 9-120's

## 2023-11-09 ENCOUNTER — Ambulatory Visit (HOSPITAL_COMMUNITY): Payer: PPO | Attending: Cardiology

## 2023-11-09 DIAGNOSIS — I5032 Chronic diastolic (congestive) heart failure: Secondary | ICD-10-CM | POA: Diagnosis not present

## 2023-11-09 LAB — ECHOCARDIOGRAM COMPLETE
AR max vel: 1.68 cm2
AV Area VTI: 1.7 cm2
AV Area mean vel: 1.62 cm2
AV Mean grad: 6 mmHg
AV Peak grad: 10.6 mmHg
Ao pk vel: 1.63 m/s
Area-P 1/2: 2.91 cm2
MV M vel: 4.43 m/s
MV Peak grad: 78.5 mmHg
Radius: 0.2 cm
S' Lateral: 3.2 cm

## 2023-11-11 ENCOUNTER — Other Ambulatory Visit (HOSPITAL_BASED_OUTPATIENT_CLINIC_OR_DEPARTMENT_OTHER): Payer: Self-pay

## 2023-11-11 MED ORDER — LOSARTAN POTASSIUM 25 MG PO TABS
25.0000 mg | ORAL_TABLET | Freq: Every day | ORAL | 4 refills | Status: DC
  Filled 2023-11-11: qty 90, 90d supply, fill #0
  Filled 2024-02-04 (×2): qty 90, 90d supply, fill #1
  Filled 2024-05-08: qty 90, 90d supply, fill #2

## 2023-11-15 ENCOUNTER — Telehealth (HOSPITAL_BASED_OUTPATIENT_CLINIC_OR_DEPARTMENT_OTHER): Payer: Self-pay

## 2023-11-15 ENCOUNTER — Telehealth: Payer: Self-pay | Admitting: Cardiology

## 2023-11-15 NOTE — Telephone Encounter (Signed)
 New Message:       Patient would like to switch from Dr Mayford Knife to Dr Jacinto Halim. Is this alright with you?

## 2023-11-15 NOTE — Telephone Encounter (Signed)
 Pt returned call- transferred from call center-   Patient states no one tells him anything and he is very upset with the care he has been receiving. He states he was asked to record his vitals for 3 days and sent them in and he states he has heard nothing back. He states he was expecting a call from Dr. Mayford Knife to go over his echo results. Explained to patient that studies are read by the provider and sent to the nursing team for discussion, at which point " pt states maybe I should find a doctor who will talk to me". Apologized to patient for his frustrations and explained a note will be sent to provider. Pt declines to discuss medication changes until seen at his follow up appointment on 4/2.     ----- Message from Armanda Magic sent at 11/09/2023  5:12 PM EDT ----- Echo showed mild LV dysfunction EF 40 to 45% with inferior and inferior lateral wall motion abnormality and increase stiffness of the heart muscle called diastolic dysfunction normal for age.  Right ventricular function was mildly reduced.  The aortic valve was mildly calcified.  Compared to echo in January right ventricular function appears slightly decreased -please change losartan to Entresto 24-26 mg twice daily for LV dysfunction.  Add spironolactone 12.5 mg daily and Jardiance 10 mg daily also for LV dysfunction.-Follow-up with me in 4 weeks.  Bmet in 1 week

## 2023-11-15 NOTE — Telephone Encounter (Addendum)
 Called mobile number and got wife ( ok per DPR), she advised he is not in at the moment but to call his other number to go over changes. Called number given by wife, no answer, left message to call back.   ----- Message from Armanda Magic sent at 11/09/2023  5:12 PM EDT ----- Echo showed mild LV dysfunction EF 40 to 45% with inferior and inferior lateral wall motion abnormality and increase stiffness of the heart muscle called diastolic dysfunction normal for age.  Right ventricular function was mildly reduced.  The aortic valve was mildly calcified.  Compared to echo in January right ventricular function appears slightly decreased -please change losartan to Entresto 24-26 mg twice daily for LV dysfunction.  Add spironolactone 12.5 mg daily and Jardiance 10 mg daily also for LV dysfunction.-Follow-up with me in 4 weeks.  Bmet in 1 week

## 2023-11-16 DIAGNOSIS — E1151 Type 2 diabetes mellitus with diabetic peripheral angiopathy without gangrene: Secondary | ICD-10-CM | POA: Diagnosis not present

## 2023-11-16 DIAGNOSIS — N181 Chronic kidney disease, stage 1: Secondary | ICD-10-CM | POA: Diagnosis not present

## 2023-11-16 DIAGNOSIS — E1122 Type 2 diabetes mellitus with diabetic chronic kidney disease: Secondary | ICD-10-CM | POA: Diagnosis not present

## 2023-11-16 DIAGNOSIS — E039 Hypothyroidism, unspecified: Secondary | ICD-10-CM | POA: Diagnosis not present

## 2023-11-16 DIAGNOSIS — I2510 Atherosclerotic heart disease of native coronary artery without angina pectoris: Secondary | ICD-10-CM | POA: Diagnosis not present

## 2023-11-17 ENCOUNTER — Other Ambulatory Visit (HOSPITAL_BASED_OUTPATIENT_CLINIC_OR_DEPARTMENT_OTHER): Payer: Self-pay

## 2023-11-17 MED ORDER — JARDIANCE 10 MG PO TABS
10.0000 mg | ORAL_TABLET | Freq: Every day | ORAL | 4 refills | Status: DC
  Filled 2023-11-17: qty 90, 90d supply, fill #0

## 2023-11-18 MED FILL — Evolocumab Subcutaneous Soln Auto-Injector 140 MG/ML: SUBCUTANEOUS | 28 days supply | Qty: 2 | Fill #1 | Status: AC

## 2023-11-21 ENCOUNTER — Encounter: Payer: Self-pay | Admitting: Podiatry

## 2023-11-21 ENCOUNTER — Ambulatory Visit: Admitting: Podiatry

## 2023-11-21 ENCOUNTER — Other Ambulatory Visit (HOSPITAL_BASED_OUTPATIENT_CLINIC_OR_DEPARTMENT_OTHER): Payer: Self-pay

## 2023-11-21 ENCOUNTER — Other Ambulatory Visit: Payer: Self-pay

## 2023-11-21 DIAGNOSIS — L600 Ingrowing nail: Secondary | ICD-10-CM | POA: Diagnosis not present

## 2023-11-21 MED ORDER — CLOPIDOGREL BISULFATE 75 MG PO TABS
75.0000 mg | ORAL_TABLET | Freq: Every day | ORAL | 3 refills | Status: DC
  Filled 2023-11-21: qty 90, 90d supply, fill #0

## 2023-11-21 NOTE — Patient Instructions (Signed)

## 2023-11-21 NOTE — Progress Notes (Signed)
 Subjective:   Patient ID: Hayden [name], male   DOB: 78 y.o.   MRN: [id number]   HPI Patient presents stating he has had a painful ingrown toenail of his left big toe and he does have diabetes under good control and has soreness with trying to wear shoe gear.  States it has been going on now for around a month does not member injury has not noted drainage does not smoke likes to be active   Review of Systems  All other systems reviewed and are negative.       Objective:  Physical Exam Vitals and nursing note reviewed.  Constitutional:      Appearance: He is well-developed.  Pulmonary:     Effort: Pulmonary effort is normal.  Musculoskeletal:        General: Normal range of motion.  Skin:    General: Skin is warm.  Neurological:     Mental Status: He is alert.     Neurovascular status intact muscle strength adequate range of motion within normal limits with incurvated medial border left big toenail painful when pressed no active drainage no active redness noted with good digital perfusion well-oriented x 3     Assessment:  Ingrown toenail deformity left hallux medial border with pain     Plan:  H&P reviewed sterile prep and went ahead today allowed him to read consent form and side and I anesthetized 60 mg like Marcaine mixture sterile prep done using sterile instrumentation removed the medial border of the left hallux exposed matrix applied phenol 3 applications 30 seconds followed by alcohol lavage sterile dressing gave instructions on soaks wear dressing 24 hours take it off earlier if throbbing were to occur and encouraged to call questions concerns which may arise

## 2023-11-30 ENCOUNTER — Ambulatory Visit: Payer: PPO | Admitting: Cardiology

## 2023-12-06 ENCOUNTER — Other Ambulatory Visit (HOSPITAL_BASED_OUTPATIENT_CLINIC_OR_DEPARTMENT_OTHER): Payer: Self-pay

## 2023-12-06 MED ORDER — METFORMIN HCL ER 500 MG PO TB24
1500.0000 mg | ORAL_TABLET | Freq: Every evening | ORAL | 4 refills | Status: DC
Start: 2023-12-06 — End: 2024-02-21
  Filled 2023-12-06: qty 270, 90d supply, fill #0

## 2023-12-14 DIAGNOSIS — L570 Actinic keratosis: Secondary | ICD-10-CM | POA: Diagnosis not present

## 2023-12-14 DIAGNOSIS — D225 Melanocytic nevi of trunk: Secondary | ICD-10-CM | POA: Diagnosis not present

## 2023-12-14 DIAGNOSIS — L821 Other seborrheic keratosis: Secondary | ICD-10-CM | POA: Diagnosis not present

## 2023-12-14 DIAGNOSIS — Z85828 Personal history of other malignant neoplasm of skin: Secondary | ICD-10-CM | POA: Diagnosis not present

## 2023-12-16 MED FILL — Evolocumab Subcutaneous Soln Auto-Injector 140 MG/ML: SUBCUTANEOUS | 28 days supply | Qty: 2 | Fill #2 | Status: AC

## 2023-12-23 NOTE — Telephone Encounter (Signed)
 Brilinta  30 day free coupon will be discarded since patient hasn't pick it up from our office.

## 2023-12-26 MED FILL — Amlodipine Besylate Tab 10 MG (Base Equivalent): ORAL | 90 days supply | Qty: 90 | Fill #1 | Status: AC

## 2023-12-27 ENCOUNTER — Other Ambulatory Visit (HOSPITAL_BASED_OUTPATIENT_CLINIC_OR_DEPARTMENT_OTHER): Payer: Self-pay

## 2024-01-11 ENCOUNTER — Other Ambulatory Visit: Payer: Self-pay

## 2024-01-13 ENCOUNTER — Ambulatory Visit: Attending: Cardiology | Admitting: Cardiology

## 2024-01-13 ENCOUNTER — Other Ambulatory Visit (HOSPITAL_BASED_OUTPATIENT_CLINIC_OR_DEPARTMENT_OTHER): Payer: Self-pay

## 2024-01-13 ENCOUNTER — Encounter: Payer: Self-pay | Admitting: Cardiology

## 2024-01-13 VITALS — BP 138/74 | HR 75 | Ht 67.0 in | Wt 161.6 lb

## 2024-01-13 DIAGNOSIS — I25708 Atherosclerosis of coronary artery bypass graft(s), unspecified, with other forms of angina pectoris: Secondary | ICD-10-CM | POA: Diagnosis not present

## 2024-01-13 DIAGNOSIS — I25118 Atherosclerotic heart disease of native coronary artery with other forms of angina pectoris: Secondary | ICD-10-CM | POA: Diagnosis not present

## 2024-01-13 DIAGNOSIS — I10 Essential (primary) hypertension: Secondary | ICD-10-CM | POA: Diagnosis not present

## 2024-01-13 DIAGNOSIS — E785 Hyperlipidemia, unspecified: Secondary | ICD-10-CM | POA: Diagnosis not present

## 2024-01-13 DIAGNOSIS — G720 Drug-induced myopathy: Secondary | ICD-10-CM

## 2024-01-13 DIAGNOSIS — T466X5D Adverse effect of antihyperlipidemic and antiarteriosclerotic drugs, subsequent encounter: Secondary | ICD-10-CM | POA: Diagnosis not present

## 2024-01-13 MED ORDER — RANOLAZINE ER 500 MG PO TB12
500.0000 mg | ORAL_TABLET | Freq: Two times a day (BID) | ORAL | 2 refills | Status: DC
  Filled 2024-01-13: qty 60, 30d supply, fill #0
  Filled 2024-02-03 – 2024-02-07 (×2): qty 60, 30d supply, fill #1

## 2024-01-13 MED FILL — Evolocumab Subcutaneous Soln Auto-Injector 140 MG/ML: SUBCUTANEOUS | 28 days supply | Qty: 2 | Fill #3 | Status: AC

## 2024-01-13 NOTE — Progress Notes (Signed)
 Cardiology Office Note:  .   Date:  01/13/2024  ID:  Hayden [name], DOB Apr 29, 1946, MRN [id number] PCP: Sun, Vyvyan, MD  [facility] Providers Cardiologist:  Knox Perl, MD   History of Present Illness: .   Hayden [name] is a 78 y.o. Patient with coronary artery disease, myocardial infarction in 1994 with PCI to LCx, due to multivessel coronary artery disease underwent CABG x 3 in 2000, due to abnormal nuclear stress test underwent PCI and stenting to SVG to OM on 08/17/2023.  Has 1 vein graft occluded, has patent LIMA to LAD.  Past medical history significant for hypertension, hypercholesterolemia.  Discussed the use of AI scribe software for clinical note transcription with the patient, who gave verbal consent to proceed.  History of Present Illness Hayden [name] is a 78 year old male with coronary artery disease who presents with shortness of breath and chest heaviness. He is accompanied by his wife.   He underwent stent placement in January 2025 in a vein graft to the obtuse marginal artery due to an 80% blockage, using a 3.0 x 16 mm Synergy XT drug-coated stent. Despite the procedure, symptoms persist. He experiences shortness of breath and chest heaviness, especially during physical activities like walking and playing golf. Exercise tolerance has decreased, with difficulty completing 18 holes of golf. Symptoms improve with rest.  Blood pressure is stable at 120-130/70-80 mmHg, and heart rate ranges from 50-100 bpm. He uses a home blood pressure monitor and trusts its accuracy. No smoking history is present. He experiences extra heartbeats, sometimes felt as skipped beats. Nitroglycerin  has not been used recently as symptoms resolve with rest.  Current medications include aspirin , amlodipine , clopidogrel , losartan , and bisoprolol . Metoprolol has not been tried. His wife notes increased daytime sleep since the stent placement, though he maintains a regular sleep schedule at  night.  Labs   Lab Results  Component Value Date   CHOL 87 (L) 05/18/2023   HDL 35 (L) 05/18/2023   LDLCALC 24 05/18/2023   TRIG 167 (H) 05/18/2023   CHOLHDL 2.5 05/18/2023   Lab Results  Component Value Date   NA 137 08/15/2023   K 4.3 08/15/2023   CO2 20 08/15/2023   GLUCOSE 132 (H) 08/15/2023   BUN 15 08/15/2023   CREATININE 0.80 08/15/2023   CALCIUM 9.8 08/15/2023   GFR 89.96 01/15/2015   EGFR 91 08/15/2023   GFRNONAA 70 10/21/2017      Latest Ref Rng & Units 08/15/2023   12:44 PM 10/21/2017    2:04 PM 09/10/2016    9:41 AM  BMP  Glucose 70 - 99 mg/dL 564  97  332   BUN 8 - 27 mg/dL 15  25  17    Creatinine 0.76 - 1.27 mg/dL 9.51  8.84  1.66   BUN/Creat Ratio 10 - 24 19  24  18    Sodium 134 - 144 mmol/L 137  139  139   Potassium 3.5 - 5.2 mmol/L 4.3  4.7  4.2   Chloride 96 - 106 mmol/L 98  100  99   CO2 20 - 29 mmol/L 20  20  22    Calcium 8.6 - 10.2 mg/dL 9.8  9.8  9.3       Latest Ref Rng & Units 08/15/2023   12:44 PM 03/27/2013   10:13 AM 09/06/2012    4:35 AM  CBC  WBC 3.4 - 10.8 x10E3/uL 13.3  7.6  4.8   Hemoglobin 13.0 - 17.7 g/dL  16.2  15.4  12.0   Hematocrit 37.5 - 51.0 % 48.5  46.2  36.9   Platelets 150 - 450 x10E3/uL 205  228.0  250    Lab Results  Component Value Date   HGBA1C 6.6 (H) 09/04/2012    Lab Results  Component Value Date   TSH 4.353 09/04/2012    ROS  Review of Systems  Cardiovascular:  Positive for chest pain and dyspnea on exertion. Negative for leg swelling.    Physical Exam:   VS:  BP 138/74 (BP Location: Right Arm, Patient Position: Sitting, Cuff Size: Normal)   Pulse 75   Ht 5\' 7"  (1.702 m)   Wt 161 lb 9.6 oz (73.3 kg)   SpO2 98%   BMI 25.31 kg/m    Wt Readings from Last 3 Encounters:  01/13/24 161 lb 9.6 oz (73.3 kg)  09/26/23 162 lb 12.8 oz (73.8 kg)  08/17/23 161 lb (73 kg)    Physical Exam Neck:     Vascular: No carotid bruit or JVD.  Cardiovascular:     Rate and Rhythm: Normal rate and regular rhythm.      Pulses: Intact distal pulses.     Heart sounds: Normal heart sounds. No murmur heard.    No gallop.  Pulmonary:     Effort: Pulmonary effort is normal.     Breath sounds: Normal breath sounds.  Abdominal:     General: Bowel sounds are normal.     Palpations: Abdomen is soft.  Musculoskeletal:     Right lower leg: No edema.     Left lower leg: No edema.    Studies Reviewed: Aaron Aas    ECHOCARDIOGRAM COMPLETE 11/09/2023 1. Left ventricular ejection fraction, by estimation, is 40 to 45%. Left ventricular ejection fraction by 3D volume is 46 %. The left ventricle has mildly decreased function. The left ventricle demonstrates regional wall motion abnormalities with basal to mid inferior and inferoseptal severe hypokinesis, basal to mid inferolateral akinesis. Left ventricular diastolic parameters are consistent with Grade I diastolic dysfunction (impaired relaxation). 2. Right ventricular systolic function is mildly reduced. The right ventricular size is normal. There is normal pulmonary artery systolic pressure. The estimated right ventricular systolic pressure is 20.6 mmHg. 3. The mitral valve is normal in structure. Trivial mitral valve regurgitation. No evidence of mitral stenosis. 4. The aortic valve is tricuspid. There is mild calcification of the aortic valve. Aortic valve regurgitation is not visualized. No aortic stenosis is present. 5. Aortic dilatation noted. There is borderline dilatation of the aortic root, measuring 38 mm. 6. The inferior vena cava is normal in size with greater than 50% respiratory variability, suggesting right atrial pressure of 3 mmHg. EKG:    EKG Interpretation Date/Time:  Friday Jan 13 2024 10:21:46 EDT Ventricular Rate:  75 PR Interval:  162 QRS Duration:  86 QT Interval:  384 QTC Calculation: 428 R Axis:   9  Text Interpretation: EKG 01/13/2024: Normal sinus rhythm at rate of 75 bpm, normal axis, incomplete right bundle branch block.  Anteroseptal infarct  old.  LVH with repolarization abnormality, cannot exclude inferior and lateral ischemia.  Compared to 08/17/2023, frequent PVCs not present but otherwise no change. Confirmed by Estel Tonelli, Jagadeesh ([id number]) on 01/13/2024 10:47:58 AM    Medications and allergies    Allergies  Allergen Reactions   Celecoxib Other (See Comments)    Per Mar   Penicillins Other (See Comments)    Gi upset    Statins     Myalgias  on simvastatin  5mg  2x/week, rosuvastatin, pravastatin, and Vytorin    Sulfa Antibiotics Swelling   Sulfamethoxazole Other (See Comments)    "hands and feet web"     Current Outpatient Medications:    amLODipine  (NORVASC ) 10 MG tablet, Take 1 tablet (10 mg total) by mouth daily., Disp: 90 tablet, Rfl: 3   aspirin  EC 81 MG tablet, Take 81 mg by mouth daily., Disp: , Rfl:    bisoprolol  (ZEBETA ) 5 MG tablet, Take 0.5 tablets (2.5 mg total) by mouth daily., Disp: 45 tablet, Rfl: 1   Cholecalciferol  (VITAMIN D ) 2000 UNITS tablet, Take 2,000 Units by mouth daily., Disp: , Rfl:    clopidogrel  (PLAVIX ) 75 MG tablet, Take 1 tablet (75 mg total) by mouth daily., Disp: 90 tablet, Rfl: 3   Coenzyme Q10 (CO Q 10 PO), Take 1 tablet by mouth daily., Disp: , Rfl:    Evolocumab  (REPATHA  SURECLICK) 140 MG/ML SOAJ, Inject 140 mg into the skin every 14 (fourteen) days., Disp: 2 mL, Rfl: 11   ezetimibe  (ZETIA ) 10 MG tablet, Take 1 tablet (10 mg total) by mouth daily., Disp: 90 tablet, Rfl: 3   hyoscyamine  (LEVSIN) 0.125 MG tablet, Take 1 tablet (0.125 mg total) by mouth every 4 (four) hours as needed., Disp: 30 tablet, Rfl: 1   icosapent  Ethyl (VASCEPA ) 1 g capsule, Take 2 capsules (2 g total) by mouth 2 (two) times daily., Disp: 360 capsule, Rfl: 3   levothyroxine  (SYNTHROID ) 75 MCG tablet, Take 75 mcg by mouth daily before breakfast., Disp: , Rfl:    levothyroxine  (SYNTHROID ) 75 MCG tablet, Take 1 tablet (75 mcg total) by mouth every morning on an empty stomach., Disp: 90 tablet, Rfl: 3   losartan  (COZAAR )  25 MG tablet, Take 1 tablet (25 mg total) by mouth daily., Disp: 90 tablet, Rfl: 4   metFORMIN  (GLUCOPHAGE -XR) 500 MG 24 hr tablet, Take 3 tablets (1,500 mg total) by mouth every evening with meal., Disp: 270 tablet, Rfl: 4   Multiple Vitamin (MULTIVITAMIN WITH MINERALS) TABS, Take 1 tablet by mouth daily., Disp: , Rfl:    nitroGLYCERIN  (NITROSTAT ) 0.4 MG SL tablet, Place 1 tablet (0.4 mg total) under the tongue every 5 (five) minutes as needed., Disp: 25 tablet, Rfl: 2   pantoprazole  (PROTONIX ) 40 MG tablet, Take 1 tablet (40 mg total) by mouth daily., Disp: 90 tablet, Rfl: 1   ranolazine (RANEXA) 500 MG 12 hr tablet, Take 1 tablet (500 mg total) by mouth 2 (two) times daily., Disp: 60 tablet, Rfl: 2   Meds ordered this encounter  Medications   ranolazine (RANEXA) 500 MG 12 hr tablet    Sig: Take 1 tablet (500 mg total) by mouth 2 (two) times daily.    Dispense:  60 tablet    Refill:  2     Medications Discontinued During This Encounter  Medication Reason   empagliflozin  (JARDIANCE ) 10 MG TABS tablet Patient Preference   metFORMIN  (GLUCOPHAGE ) 500 MG tablet Patient Preference   losartan  (COZAAR ) 25 MG tablet Duplicate     ASSESSMENT AND PLAN: .      ICD-10-CM   1. Coronary artery disease of native artery of native heart with stable angina pectoris (HCC)  I25.118 ranolazine (RANEXA) 500 MG 12 hr tablet    2. Coronary artery disease of bypass graft of native heart with stable angina pectoris (HCC)  I25.708     3. Primary hypertension  I10 EKG 12-Lead    4. Hyperlipidemia LDL goal <55  E78.5  5. Statin myopathy  G72.0    T46.6X5A       Assessment and Plan Assessment & Plan Coronary artery disease with suspected restenosis   Coronary artery disease with suspected restenosis of the stent placed in December 2024 presents with dyspnea, chest heaviness, and reduced exercise tolerance, indicating possible angina and restenosis or disease progression. The stent is in the vein graft  to the obtuse marginal artery which feeds a relatively large occluded RCA.  We discussed regarding invasive procedure versus medical therapy, he prefers medical management over repeat cardiac catheterization to assess graft and stent patency.  Prescribe Ranexa (ranolazine) 500 mg twice daily. Instruct him to monitor symptoms and report improvement or lack thereof after 2-3 weeks. Consider increasing Ranexa to 1000 mg twice daily if symptoms do not improve, plan for possible cardiac catheterization if symptoms persist despite medical management. Continue current medications: aspirin , amlodipine , clopidogrel , losartan , and bisoprolol . Advise use of nitroglycerin  as needed for chest tightness.  Premature ventricular contractions (PVCs)   Intermittent palpitations are suggestive of PVCs are reported as occasional extra heartbeats. These are common and may not be clinically significant. Reassured about the benign nature of PVCs in this context.  Primary hypertension Blood pressure is well-controlled with a combination of losartan  25 mg daily, amlodipine  10 mg daily along with bisoprolol  5 mg daily.  Reviewed his labs including lipids, LDL is well-controlled on present dose of Repatha  and Zetia .  He is statin intolerant.  Signed,  Knox Perl, MD, [facility] 01/13/2024, 8:55 PM [facility] [address] [place], [place] [address] Phone: [phone]. Fax:  [phone]

## 2024-01-13 NOTE — Patient Instructions (Addendum)
 Medication Instructions:  Your physician has recommended you make the following change in your medication:   1) START ranolazine (Ranexa) 500 mg twice daily  *If you need a refill on your cardiac medications before your next appointment, please call your pharmacy*  Follow-Up: At [facility], you and your health needs are our priority.  As part of our continuing mission to provide you with exceptional heart care, our providers are all part of one team.  This team includes your primary Cardiologist (physician) and Advanced Practice Providers or APPs (Physician Assistants and Nurse Practitioners) who all work together to provide you with the care you need, when you need it.  Your next appointment:   2 month(s)  The format for your next appointment:   In Person  Provider:   Knox Perl, MD{  We recommend signing up for the patient portal called "MyChart".  Sign up information is provided on this After Visit Summary.  MyChart is used to connect with patients for Virtual Visits (Telemedicine).  Patients are able to view lab/test results, encounter notes, upcoming appointments, etc.  Non-urgent messages can be sent to your provider as well.   To learn more about what you can do with MyChart, go to [email or web address].

## 2024-01-27 ENCOUNTER — Other Ambulatory Visit (HOSPITAL_BASED_OUTPATIENT_CLINIC_OR_DEPARTMENT_OTHER): Payer: Self-pay

## 2024-02-03 ENCOUNTER — Other Ambulatory Visit (HOSPITAL_BASED_OUTPATIENT_CLINIC_OR_DEPARTMENT_OTHER): Payer: Self-pay

## 2024-02-04 ENCOUNTER — Other Ambulatory Visit (HOSPITAL_BASED_OUTPATIENT_CLINIC_OR_DEPARTMENT_OTHER): Payer: Self-pay

## 2024-02-06 ENCOUNTER — Encounter: Payer: Self-pay | Admitting: Cardiology

## 2024-02-06 DIAGNOSIS — I25708 Atherosclerosis of coronary artery bypass graft(s), unspecified, with other forms of angina pectoris: Secondary | ICD-10-CM

## 2024-02-06 DIAGNOSIS — I25118 Atherosclerotic heart disease of native coronary artery with other forms of angina pectoris: Secondary | ICD-10-CM

## 2024-02-07 ENCOUNTER — Other Ambulatory Visit: Payer: Self-pay

## 2024-02-07 ENCOUNTER — Other Ambulatory Visit (HOSPITAL_BASED_OUTPATIENT_CLINIC_OR_DEPARTMENT_OTHER): Payer: Self-pay

## 2024-02-07 ENCOUNTER — Other Ambulatory Visit: Payer: Self-pay | Admitting: Cardiology

## 2024-02-07 MED ORDER — PANTOPRAZOLE SODIUM 40 MG PO TBEC
40.0000 mg | DELAYED_RELEASE_TABLET | Freq: Every day | ORAL | 3 refills | Status: DC
  Filled 2024-02-07 (×2): qty 90, 90d supply, fill #0
  Filled 2024-05-08: qty 90, 90d supply, fill #1

## 2024-02-07 MED ORDER — CLOPIDOGREL BISULFATE 75 MG PO TABS
75.0000 mg | ORAL_TABLET | Freq: Every day | ORAL | 3 refills | Status: DC
  Filled 2024-02-07 (×2): qty 90, 90d supply, fill #0
  Filled 2024-05-09: qty 90, 90d supply, fill #1

## 2024-02-07 NOTE — Telephone Encounter (Signed)
 ICD-10-CM   1. Coronary artery disease of native artery of native heart with stable angina pectoris (HCC)  I25.118 clopidogrel  (PLAVIX ) 75 MG tablet    2. Coronary artery disease of bypass graft of native heart with stable angina pectoris (HCC)  I25.708 clopidogrel  (PLAVIX ) 75 MG tablet     Medications Discontinued During This Encounter  Medication Reason   pantoprazole  (PROTONIX ) 40 MG tablet Completed Course   aspirin  EC 81 MG tablet Completed Course   clopidogrel  (PLAVIX ) 75 MG tablet    Meds ordered this encounter  Medications   clopidogrel  (PLAVIX ) 75 MG tablet    Sig: Take 1 tablet (75 mg total) by mouth daily.    Dispense:  90 tablet    Refill:  3

## 2024-02-07 NOTE — Telephone Encounter (Signed)
Follow ups

## 2024-02-07 NOTE — Addendum Note (Signed)
 Addended by: Guss Legacy on: 02/07/2024 01:48 PM   Modules accepted: Orders

## 2024-02-08 MED FILL — Evolocumab Subcutaneous Soln Auto-Injector 140 MG/ML: SUBCUTANEOUS | 28 days supply | Qty: 2 | Fill #4 | Status: AC

## 2024-02-10 NOTE — Telephone Encounter (Signed)
 Eliquis 5 mg BID 90 days with 3 refills stop warfarin today and start Eliquis Monday. See if he is willing to come pick up at our [facility] pharmacy

## 2024-02-13 ENCOUNTER — Other Ambulatory Visit (HOSPITAL_BASED_OUTPATIENT_CLINIC_OR_DEPARTMENT_OTHER): Payer: Self-pay

## 2024-02-13 NOTE — Telephone Encounter (Signed)
 Please review pt update about BP & meds

## 2024-02-20 NOTE — Telephone Encounter (Signed)
 Please schedule an OV and probably will need cardiac cath

## 2024-02-20 NOTE — Telephone Encounter (Signed)
 I spoke with patient and scheduled him to see Glendia Ferrier, PA tomorrow (6/24) at 8:25

## 2024-02-20 NOTE — Progress Notes (Signed)
 "     OFFICE NOTE:    Date:  02/21/2024  ID:  Hayden [name], DOB [birth year], MRN [id number] PCP: Sun, Vyvyan, MD  [facility] Providers Cardiologist:  Gordy Bergamo, MD       Patient Profile:  Coronary artery disease  S/p MI in 1994 s/p PTCA of LCx  S/p CABG in 2000 PET MPI 07/27/23: high risk c/w ischemia in distribution of LAD, RCA S/p 3 x 16 mm DES to S-OM in 07/2023 LHC 08/17/23: dLM to pLAD 100, oLCx 99, mLCx 100, pRCA 100; L-LAD patent, S-OM patent w 80% at origin (PCI), S-PDA 100 CTO HFmrEF (heart failure with mildly reduced ejection fraction)  TTE 11/09/23: EF 40-45, inf HK, inf-lat AK,Gr 1 DD, mildly reduced RVSF, RVSP 20.6, trivial MR, aortic root 38 mm  PVCs  Carotid artery stenosis Dilated aortic root  Hypertension  Hyperlipidemia  Hypothyroidism        Discussed the use of AI scribe software for clinical note transcription with the patient, who gave verbal consent to proceed. History of Present Illness Hayden [name] is a 78 y.o. male who returns for He was last seen by Dr. Bergamo 01/13/24. He had undergone PCI with DES to the S-OM in Jan 2025 after a PET MPI was abnormal. He had recurrent chest pain and Dr. Bergamo discussed med Rx vs repeat cardiac catheterization. Decision was made to pursue med Rx first. He was started on Ranolazine . He had some dizziness with Ranolazine  and ultimately stopped this. He had recurrent chest pain after stopping it.   He is here today with his wife. He continues to experience anginal symptoms. He had a worse episode several days ago with chest tightness and cold sweats, relieved by nitroglycerin . His chest pain was 4/10. During this episode, his pulse reached 135, and his blood pressure was elevated. Since then, he has not had significant recurrence. Dizziness improved after stopping the Ranolazine . However, he still experiences occasional lightheadedness. He has not had orthopnea, swelling in his legs. He does not report any symptoms directly  associated with the PVCs, although he acknowledges feeling his heart 'bump' occasionally. He denies smoking and does not take PDE-5 inhibitors.      Review of Systems  Gastrointestinal:  Negative for hematochezia and melena.  Genitourinary:  Negative for hematuria.  -See HPI    Studies Reviewed:  EKG Interpretation Date/Time:  Tuesday February 21 2024 08:27:54 EDT Ventricular Rate:  78 PR Interval:  154 QRS Duration:  90 QT Interval:  372 QTC Calculation: 424 R Axis:   24  Text Interpretation: Sinus rhythm with occasional Premature ventricular complexes Left ventricular hypertrophy ST & T wave abnormality, consider inferior ischemia ST & T wave abnormality, consider anterolateral ischemia No significant change since last tracing 01/13/24 Confirmed by Lelon Hamilton [phone]) on 02/21/2024 8:44:15 AM   Results LABS LDL: 24 (05/2023)  Risk Assessment/Calculations:          Physical Exam:  VS:  BP 130/68   Pulse 78   Ht 5' 7 (1.702 m)   Wt 160 lb 12.8 oz (72.9 kg)   SpO2 95%   BMI 25.18 kg/m        Wt Readings from Last 3 Encounters:  02/21/24 160 lb 12.8 oz (72.9 kg)  01/13/24 161 lb 9.6 oz (73.3 kg)  09/26/23 162 lb 12.8 oz (73.8 kg)    Constitutional:      Appearance: Healthy appearance. Not in distress.  Neck:     Vascular:  JVD normal.  Pulmonary:     Breath sounds: Normal breath sounds. No wheezing. No rales.  Cardiovascular:     Normal rate. Regular rhythm.     Murmurs: There is no murmur.  Edema:    Peripheral edema absent.  Abdominal:     Palpations: Abdomen is soft.        Assessment and Plan:    Assessment & Plan Coronary artery disease involving native coronary artery of native heart with angina pectoris (HCC) Myocardial infarction in 1994 treated with angioplasty of the LCX, followed by CABG in 2000. Recent DES to the vein graft to the OM in December 2024. Known occlusion of the vein graft to the PDA. L-LAD was patent.  He has had persistent angina since  recent PCI, with symptom escalation several days ago but no recurrence.  He was unable to tolerate Ranexa  due to dizziness. EKG today showed no acute changes from May 16th. Cardiac catheterization is favored due to ongoing symptoms.  I reviewed this with Dr. Ladona who agreed.  He does not take PDE-5 inhibitors.  He notes that he has not taken long-acting nitrates in the past. - Continue amlodipine  10 mg daily - Continue bisoprolol  2.5 mg daily - Continue Plavix  75 mg daily - Resume aspirin  81 mg daily - Start isosorbide  mononitrate 15 mg daily - Arrange cardiac catheterization with Dr. Ladona next week - Patient knows to go to the emergency room if recurrent symptoms occur Heart failure with mildly reduced ejection fraction (HFmrEF, 41-49%) (HCC) EF 40-45 by echocardiogram and March 2025.  Volume status stable.  NYHA II. - Continue bisoprolol  2.5 mg daily - Continue losartan  25 mg daily Primary hypertension Blood pressure well-controlled. - Continue amlodipine  10 mg daily - Continue bisoprolol  2.5 mg daily - Continue losartan  25 mg daily Hyperlipidemia LDL goal <55 LDL in September 2024 was optimal at 24. - Continue Repatha  140 mg every two weeks PVC's (premature ventricular contractions) PVCs noted on exam and EKG. Unclear if symptoms are related to PVCs. If cardiac catheterization does not reveal significant disease requiring PCI, consider arranging ZIO monitor to assess PVC burden.      Informed Consent   Shared Decision Making/Informed Consent The risks [stroke (1 in 1000), death (1 in 1000), kidney failure [usually temporary] (1 in 500), bleeding (1 in 200), allergic reaction [possibly serious] (1 in 200)], benefits (diagnostic support and management of coronary artery disease) and alternatives of a cardiac catheterization were discussed in detail with Mr. Niblett and he is willing to proceed.     Dispo:  Return for Scheduled Follow Up, w/ Dr. Ladona.  Signed, Glendia Ferrier, PA-C   "

## 2024-02-21 ENCOUNTER — Encounter: Payer: Self-pay | Admitting: Physician Assistant

## 2024-02-21 ENCOUNTER — Other Ambulatory Visit (HOSPITAL_BASED_OUTPATIENT_CLINIC_OR_DEPARTMENT_OTHER): Payer: Self-pay

## 2024-02-21 ENCOUNTER — Ambulatory Visit: Attending: Physician Assistant | Admitting: Physician Assistant

## 2024-02-21 VITALS — BP 130/68 | HR 78 | Ht 67.0 in | Wt 160.8 lb

## 2024-02-21 DIAGNOSIS — I5020 Unspecified systolic (congestive) heart failure: Secondary | ICD-10-CM | POA: Diagnosis not present

## 2024-02-21 DIAGNOSIS — I10 Essential (primary) hypertension: Secondary | ICD-10-CM | POA: Diagnosis not present

## 2024-02-21 DIAGNOSIS — I25119 Atherosclerotic heart disease of native coronary artery with unspecified angina pectoris: Secondary | ICD-10-CM

## 2024-02-21 DIAGNOSIS — E785 Hyperlipidemia, unspecified: Secondary | ICD-10-CM | POA: Diagnosis not present

## 2024-02-21 DIAGNOSIS — I493 Ventricular premature depolarization: Secondary | ICD-10-CM | POA: Diagnosis not present

## 2024-02-21 LAB — CBC

## 2024-02-21 MED ORDER — ISOSORBIDE MONONITRATE ER 30 MG PO TB24
15.0000 mg | ORAL_TABLET | Freq: Every day | ORAL | 3 refills | Status: DC
Start: 2024-02-21 — End: 2024-03-12
  Filled 2024-02-21: qty 45, 90d supply, fill #0

## 2024-02-21 MED ORDER — ASPIRIN 81 MG PO TBEC: 81.0000 mg | DELAYED_RELEASE_TABLET | Freq: Every day | ORAL | Status: DC

## 2024-02-21 NOTE — Patient Instructions (Addendum)
 Medication Instructions:  Your physician has recommended you make the following change in your medication:   START Imdur (Isosorbide) 30 mg taking 1/2 tablet daily   START Aspirin  81 mg taking 1 daily    *If you need a refill on your cardiac medications before your next appointment, please call your pharmacy*  Lab Work: TODAY:  BMET & CBC  If you have labs (blood work) drawn today and your tests are completely normal, you will receive your results only by: MyChart Message (if you have MyChart) OR A paper copy in the mail If you have any lab test that is abnormal or we need to change your treatment, we will call you to review the results.  Testing/Procedures: Your physician has requested that you have a cardiac catheterization. Cardiac catheterization is used to diagnose and/or treat various heart conditions. Doctors may recommend this procedure for a number of different reasons. The most common reason is to evaluate chest pain. Chest pain can be a symptom of coronary artery disease (CAD), and cardiac catheterization can show whether plaque is narrowing or blocking your heart's arteries. This procedure is also used to evaluate the valves, as well as measure the blood flow and oxygen levels in different parts of your heart. For further information please visit [email or web address] Please follow instruction sheet, BELOW:        Cardiac/Peripheral Catheterization   You are scheduled for a Cardiac Catheterization on Wednesday, July 2 with Dr. Gordy Bergamo.  1. Please arrive at the [facility] (Main Entrance A) at [facility]: [address] [place], [place] [address] at 11:00 AM (This time is 2 hour(s) before your procedure to ensure your preparation).   Free valet parking service is available. You will check in at ADMITTING. The support person will be asked to wait in the waiting room.  It is OK to have someone drop you off and come back when you are ready to be discharged.         Special note: Every effort is made to have your procedure done on time. Please understand that emergencies sometimes delay scheduled procedures.  2. Diet: Do not eat solid foods after midnight.  You may have clear liquids until 5 AM the day of the procedure.  3. Labs: TODAY  4. Medication instructions in preparation for your procedure:   Contrast Allergy : No   Stop taking, Cozaar  (Losartan ) Tuesday, July 1,  On the morning of your procedure, take Aspirin  81 mg and Plavix /Clopidogrel  and any morning medicines NOT listed above.  You may use sips of water.  5. Plan to go home the same day, you will only stay overnight if medically necessary. 6. You MUST have a responsible adult to drive you home. 7. An adult MUST be with you the first 24 hours after you arrive home. 8. Bring a current list of your medications, and the last time and date medication taken. 9. Bring ID and current insurance cards. 10.Please wear clothes that are easy to get on and off and wear slip-on shoes.  Thank you for allowing us  to care for you!   -- [facility]   Follow-Up: At [facility], you and your health needs are our priority.  As part of our continuing mission to provide you with exceptional heart care, our providers are all part of one team.  This team includes your primary Cardiologist (physician) and Advanced Practice Providers or APPs (Physician Assistants and Nurse Practitioners) who all work  together to provide you with the care you need, when you need it.  Your next appointment:   2 week(s) ALREADY SCHEDULED FOR 03/14/24 WITH DR. LADONA  Provider:   Gordy LADONA, MD or Glendia Ferrier, PA-C          We recommend signing up for the patient portal called MyChart.  Sign up information is provided on this After Visit Summary.  MyChart is used to connect with patients for Virtual Visits (Telemedicine).  Patients are able to view lab/test results, encounter notes,  upcoming appointments, etc.  Non-urgent messages can be sent to your provider as well.   To learn more about what you can do with MyChart, go to [email or web address].   Other Instructions

## 2024-02-21 NOTE — Assessment & Plan Note (Signed)
 Blood pressure well-controlled. - Continue amlodipine  10 mg daily - Continue bisoprolol  2.5 mg daily - Continue losartan  25 mg daily

## 2024-02-21 NOTE — Assessment & Plan Note (Signed)
 Myocardial infarction in 1994 treated with angioplasty of the LCX, followed by CABG in 2000. Recent DES to the vein graft to the OM in December 2024. Known occlusion of the vein graft to the PDA. L-LAD was patent.  He has had persistent angina since recent PCI, with symptom escalation several days ago but no recurrence.  He was unable to tolerate Ranexa  due to dizziness. EKG today showed no acute changes from May 16th. Cardiac catheterization is favored due to ongoing symptoms.  I reviewed this with Dr. Ladona who agreed.  He does not take PDE-5 inhibitors.  He notes that he has not taken long-acting nitrates in the past. - Continue amlodipine  10 mg daily - Continue bisoprolol  2.5 mg daily - Continue Plavix  75 mg daily - Resume aspirin  81 mg daily - Start isosorbide mononitrate 15 mg daily - Arrange cardiac catheterization with Dr. Ladona next week - Patient knows to go to the emergency room if recurrent symptoms occur

## 2024-02-22 ENCOUNTER — Ambulatory Visit: Payer: Self-pay | Admitting: Physician Assistant

## 2024-02-22 LAB — BASIC METABOLIC PANEL WITH GFR
BUN/Creatinine Ratio: 16 (ref 10–24)
BUN: 15 mg/dL (ref 8–27)
CO2: 22 mmol/L (ref 20–29)
Calcium: 10.4 mg/dL — ABNORMAL HIGH (ref 8.6–10.2)
Chloride: 98 mmol/L (ref 96–106)
Creatinine, Ser: 0.95 mg/dL (ref 0.76–1.27)
Glucose: 180 mg/dL — ABNORMAL HIGH (ref 70–99)
Potassium: 4.3 mmol/L (ref 3.5–5.2)
Sodium: 142 mmol/L (ref 134–144)
eGFR: 82 mL/min/{1.73_m2} (ref >59)

## 2024-02-22 LAB — CBC
Hematocrit: 50.1 % (ref 37.5–51.0)
Hemoglobin: 16.9 g/dL (ref 13.0–17.7)
MCH: 32.3 pg (ref 26.6–33.0)
MCHC: 33.7 g/dL (ref 31.5–35.7)
MCV: 96 fL (ref 79–97)
Platelets: 254 10*3/uL (ref 150–450)
RBC: 5.24 x10E6/uL (ref 4.14–5.80)
RDW: 12.3 % (ref 11.6–15.4)
WBC: 8.7 10*3/uL (ref 3.4–10.8)

## 2024-02-23 ENCOUNTER — Other Ambulatory Visit (HOSPITAL_BASED_OUTPATIENT_CLINIC_OR_DEPARTMENT_OTHER): Payer: Self-pay

## 2024-02-23 MED ORDER — PREDNISONE 5 MG (21) PO TBPK
ORAL_TABLET | ORAL | 1 refills | Status: DC
  Filled 2024-02-23: qty 21, 6d supply, fill #0
  Filled 2024-03-07: qty 21, 6d supply, fill #1

## 2024-02-27 ENCOUNTER — Telehealth: Payer: Self-pay | Admitting: *Deleted

## 2024-02-27 NOTE — Telephone Encounter (Addendum)
 Cardiac Catheterization scheduled at [facility] for: Wednesday February 29, 2024 9 AM (time change per cath lab) Arrival time [facility] Main Entrance A at: 7 AM  Nothing to eat after midnight prior to procedure, clear liquids until 5 AM day of procedure.  Medication instructions: -Hold:  Metformin -day of procedure and 48 hours post procedure -Other usual morning medications can be taken with sips of water including aspirin  81 mg and Plavix  75 mg.  Plan to go home the same day, you will only stay overnight if medically necessary.  You must have responsible adult to drive you home.  Someone must be with you the first 24 hours after you arrive home.  Reviewed procedure instructions/procedure time change wiith patient.

## 2024-02-29 ENCOUNTER — Ambulatory Visit (HOSPITAL_COMMUNITY)
Admission: RE | Admit: 2024-02-29 | Discharge: 2024-02-29 | Disposition: A | Discharge status: Home / Self Care | Attending: Cardiology | Admitting: Cardiology

## 2024-02-29 ENCOUNTER — Other Ambulatory Visit: Payer: Self-pay

## 2024-02-29 ENCOUNTER — Encounter (HOSPITAL_COMMUNITY)
Admission: RE | Disposition: A | Discharge status: Home / Self Care | Payer: Self-pay | Source: Home / Self Care | Attending: Cardiology

## 2024-02-29 DIAGNOSIS — I252 Old myocardial infarction: Secondary | ICD-10-CM | POA: Diagnosis not present

## 2024-02-29 DIAGNOSIS — I2584 Coronary atherosclerosis due to calcified coronary lesion: Secondary | ICD-10-CM | POA: Diagnosis not present

## 2024-02-29 DIAGNOSIS — Z7982 Long term (current) use of aspirin: Secondary | ICD-10-CM | POA: Diagnosis not present

## 2024-02-29 DIAGNOSIS — Z955 Presence of coronary angioplasty implant and graft: Secondary | ICD-10-CM | POA: Diagnosis not present

## 2024-02-29 DIAGNOSIS — I2582 Chronic total occlusion of coronary artery: Secondary | ICD-10-CM | POA: Diagnosis not present

## 2024-02-29 DIAGNOSIS — Z79899 Other long term (current) drug therapy: Secondary | ICD-10-CM | POA: Insufficient documentation

## 2024-02-29 DIAGNOSIS — Z951 Presence of aortocoronary bypass graft: Secondary | ICD-10-CM | POA: Insufficient documentation

## 2024-02-29 DIAGNOSIS — E7800 Pure hypercholesterolemia, unspecified: Secondary | ICD-10-CM | POA: Insufficient documentation

## 2024-02-29 DIAGNOSIS — I25119 Atherosclerotic heart disease of native coronary artery with unspecified angina pectoris: Secondary | ICD-10-CM | POA: Diagnosis not present

## 2024-02-29 DIAGNOSIS — I2510 Atherosclerotic heart disease of native coronary artery without angina pectoris: Secondary | ICD-10-CM

## 2024-02-29 DIAGNOSIS — Z7902 Long term (current) use of antithrombotics/antiplatelets: Secondary | ICD-10-CM | POA: Diagnosis not present

## 2024-02-29 DIAGNOSIS — I10 Essential (primary) hypertension: Secondary | ICD-10-CM | POA: Insufficient documentation

## 2024-02-29 HISTORY — PX: CORONARY ATHERECTOMY: CATH118238

## 2024-02-29 HISTORY — PX: LEFT HEART CATH AND CORS/GRAFTS ANGIOGRAPHY: CATH118250

## 2024-02-29 HISTORY — PX: CORONARY STENT INTERVENTION: CATH118234

## 2024-02-29 LAB — GLUCOSE, CAPILLARY
Glucose-Capillary: 174 mg/dL — ABNORMAL HIGH (ref 70–99)
Glucose-Capillary: 129 mg/dL — ABNORMAL HIGH (ref 70–99)

## 2024-02-29 LAB — POCT ACTIVATED CLOTTING TIME
Activated Clotting Time: 256 s
Activated Clotting Time: 308 s

## 2024-02-29 SURGERY — LEFT HEART CATH AND CORS/GRAFTS ANGIOGRAPHY
Anesthesia: LOCAL

## 2024-02-29 MED ORDER — SODIUM CHLORIDE 0.9 % IV SOLN: INTRAVENOUS | Status: DC

## 2024-02-29 MED ORDER — HYDRALAZINE HCL 20 MG/ML IJ SOLN: 5.0000 mg | INTRAMUSCULAR | Status: DC | PRN

## 2024-02-29 MED ORDER — LABETALOL HCL 5 MG/ML IV SOLN: 10.0000 mg | INTRAVENOUS | Status: DC | PRN

## 2024-02-29 MED ORDER — SODIUM CHLORIDE 0.9 % IV SOLN
INTRAVENOUS | Status: DC | PRN
  Administered 2024-02-29: 10 mL/h via INTRAVENOUS

## 2024-02-29 MED ORDER — NITROGLYCERIN 1 MG/10 ML FOR IR/CATH LAB
INTRA_ARTERIAL | Status: DC | PRN
Start: 2024-02-29 — End: 2024-02-29
  Administered 2024-02-29: 200 ug via INTRACORONARY

## 2024-02-29 MED ORDER — ASPIRIN 81 MG PO CHEW: 81.0000 mg | CHEWABLE_TABLET | ORAL | Status: DC

## 2024-02-29 MED ORDER — SODIUM CHLORIDE 0.9% FLUSH: 3.0000 mL | Freq: Two times a day (BID) | INTRAVENOUS | Status: DC

## 2024-02-29 MED ORDER — FENTANYL CITRATE (PF) 100 MCG/2ML IJ SOLN
INTRAMUSCULAR | Status: DC | PRN
  Administered 2024-02-29 (×3): 25 ug via INTRAVENOUS

## 2024-02-29 MED ORDER — ONDANSETRON HCL 4 MG/2ML IJ SOLN: 4.0000 mg | Freq: Four times a day (QID) | INTRAMUSCULAR | Status: DC | PRN

## 2024-02-29 MED ORDER — SODIUM CHLORIDE 0.9% FLUSH: 3.0000 mL | INTRAVENOUS | Status: DC | PRN

## 2024-02-29 MED ORDER — HEPARIN SODIUM (PORCINE) 1000 UNIT/ML IJ SOLN
INTRAMUSCULAR | Status: AC
Start: 2024-02-29 — End: 2024-02-29
  Filled 2024-02-29: qty 10

## 2024-02-29 MED ORDER — MIDAZOLAM HCL 2 MG/2ML IJ SOLN
INTRAMUSCULAR | Status: AC
  Filled 2024-02-29: qty 2

## 2024-02-29 MED ORDER — VIPERSLIDE LUBRICANT OPTIME: TOPICAL | Status: DC | PRN

## 2024-02-29 MED ORDER — VERAPAMIL HCL 2.5 MG/ML IV SOLN
INTRAVENOUS | Status: AC
  Filled 2024-02-29: qty 2

## 2024-02-29 MED ORDER — IOHEXOL 350 MG/ML SOLN
INTRAVENOUS | Status: DC | PRN
Start: 2024-02-29 — End: 2024-02-29
  Administered 2024-02-29: 100 mL

## 2024-02-29 MED ORDER — LIDOCAINE HCL (PF) 1 % IJ SOLN
INTRAMUSCULAR | Status: DC | PRN
Start: 2024-02-29 — End: 2024-02-29
  Administered 2024-02-29: 2 mL

## 2024-02-29 MED ORDER — NITROGLYCERIN 1 MG/10 ML FOR IR/CATH LAB
INTRA_ARTERIAL | Status: AC
  Filled 2024-02-29: qty 10

## 2024-02-29 MED ORDER — MIDAZOLAM HCL 2 MG/2ML IJ SOLN
INTRAMUSCULAR | Status: DC | PRN
  Administered 2024-02-29: 2 mg via INTRAVENOUS
  Administered 2024-02-29: 1 mg via INTRAVENOUS

## 2024-02-29 MED ORDER — ACETAMINOPHEN 325 MG PO TABS: 650.0000 mg | ORAL_TABLET | ORAL | Status: DC | PRN

## 2024-02-29 MED ORDER — HEPARIN SODIUM (PORCINE) 1000 UNIT/ML IJ SOLN
INTRAMUSCULAR | Status: AC
  Filled 2024-02-29: qty 10

## 2024-02-29 MED ORDER — SODIUM CHLORIDE 0.9 % IV SOLN: 250.0000 mL | INTRAVENOUS | Status: DC | PRN

## 2024-02-29 MED ORDER — FENTANYL CITRATE (PF) 100 MCG/2ML IJ SOLN
INTRAMUSCULAR | Status: AC
  Filled 2024-02-29: qty 2

## 2024-02-29 MED ORDER — VERAPAMIL HCL 2.5 MG/ML IV SOLN
INTRAVENOUS | Status: DC | PRN
  Administered 2024-02-29: 10 mL via INTRA_ARTERIAL

## 2024-02-29 MED ORDER — HEPARIN (PORCINE) IN NACL 1000-0.9 UT/500ML-% IV SOLN
INTRAVENOUS | Status: DC | PRN
Start: 2024-02-29 — End: 2024-02-29
  Administered 2024-02-29 (×3): 500 mL

## 2024-02-29 MED ORDER — HEPARIN SODIUM (PORCINE) 1000 UNIT/ML IJ SOLN
INTRAMUSCULAR | Status: DC | PRN
  Administered 2024-02-29: 4000 [IU] via INTRAVENOUS
  Administered 2024-02-29: 6000 [IU] via INTRAVENOUS
  Administered 2024-02-29: 2000 [IU] via INTRAVENOUS

## 2024-02-29 SURGICAL SUPPLY — 21 items
BALLOON EMERGE MR 2.5X20 (BALLOONS) IMPLANT
BALLOON SAPPHIRE NC24 3.0X10 (BALLOONS) IMPLANT
CATH INFINITI 5FR JL4 (CATHETERS) IMPLANT
CATH INFINITI JR4 5F (CATHETERS) IMPLANT
CATH TELEPORT (CATHETERS) IMPLANT
CATH VISTA GUIDE 6FR XB3.5 EPK (CATHETERS) IMPLANT
COVER PRB 48X5XTLSCP FOLD TPE (BAG) IMPLANT
CROWN DIAMONDBACK CLASSIC 1.25 (BURR) IMPLANT
DEVICE RAD COMP TR BAND LRG (VASCULAR PRODUCTS) IMPLANT
ELECT DEFIB PAD ADLT CADENCE (PAD) IMPLANT
GLIDESHEATH SLEND A-KIT 6F 22G (SHEATH) IMPLANT
GUIDEWIRE ANGLED .035X150CM (WIRE) IMPLANT
GUIDEWIRE INQWIRE 1.5J.035X260 (WIRE) IMPLANT
KIT ENCORE 26 ADVANTAGE (KITS) IMPLANT
KIT HEMO VALVE WATCHDOG (MISCELLANEOUS) IMPLANT
LUBRICANT VIPERSLIDE CORONARY (MISCELLANEOUS) IMPLANT
PACK CARDIAC CATHETERIZATION (CUSTOM PROCEDURE TRAY) ×1 IMPLANT
SET ATX-X65L (MISCELLANEOUS) IMPLANT
STENT SYNERGY XD 2.75X20 (Permanent Stent) IMPLANT
WIRE RUNTHROUGH .014X300CM (WIRE) IMPLANT
WIRE VIPERWIRE COR FLEX .012 (WIRE) IMPLANT

## 2024-02-29 NOTE — Progress Notes (Signed)
 Discussed with pt and wife stent, Plavix  importance, restrictions, diet, exercise, NTG and CRPII. Pt receptive. Will refer to [place] CRPII (pt interested in doing program now). [id number] Aliene Aris BS, ACSM-CEP 02/29/2024 12:34 PM

## 2024-02-29 NOTE — Discharge Summary (Signed)
 Discharge Summary for Same Day PCI   Patient ID: Hayden [name] MRN: [id number]; DOB: [birth year]  Admit date: 02/29/2024 Discharge date: 02/29/2024  Primary Care Provider: Sun, Vyvyan, MD  Primary Cardiologist: Gordy Bergamo, MD  Primary Electrophysiologist:  None   Discharge Diagnoses    Active Problems:   Coronary artery disease  Diagnostic Studies/Procedures   Cardiac Catheterization 02/29/24: Hemodynamic data: LVEDP -1 mmHg.  No pressure gradient across the aortic valve.   Angiographic data: LM: Heavily calcified, very short, distal left main and ostial CX heavily calcified 99% stenosis. LAD: Flush occluded in the ostium.  LAD supplied by LIMA which was patent by catheterization December 2024 and hence not cannulated. LCx: Is a small to moderate-sized vessel, there is a ostial 99% stenosis followed by a tandem 99% stenosis after the origin of a small OM1.  Gives origin to a moderate-sized OM 2.  After the origin of OM 2 the CX is occluded.  Distal CX with OM 3 is very large and supplied by SVG. RCA: Occluded in the ostium, not cannulated.   SVG to OM 3: Widely patent.  Previously placed 3.0 x 16 mm Synergy XD DES on 08/17/2023 is widely patent.  OM 3 is very large and has multiple secondary branches and gives extensive collaterals to the distal RCA. LIMA to LAD: Not cannulated.  Patent on prior cardiac catheterization 09/18/2022.   Intervention data: Successful PTCA, arthrectomy of distal left main and proximal and mid CX followed by stenting with a 2.75 x 20 mm Synergy XD DES and postdilated with a 3.0 x 10 mm [place] balloon into the ostium and distal left main.  Stenosis reduced from 99% in the distal left main, proximal CX, mid CX to 0% with improvement in TIMI II flow to TIMI-3 flow.      Impression and recommendations: Patient felt immediately better post PCI, stated that his breathing also improved.  Patient will be discharged home today with outpatient follow-up, he will need  DAPT for 6 months with aspirin  Plavix . _____________   History of Present Illness     Hayden [name] is a 78 y.o. male with coronary artery disease, myocardial infarction in 1994 with PCI to LCx, due to multivessel coronary artery disease underwent CABG x 3 in 2000, due to abnormal nuclear stress test underwent PCI and stenting to SVG to OM on 08/17/2023. Has 1 vein graft occluded, has patent LIMA to LAD. Past medical history significant for hypertension, hypercholesterolemia.   He underwent stent placement in January 2025 in a vein graft to the obtuse marginal artery due to an 80% blockage, using a 3.0 x 16 mm Synergy XT drug-coated stent. Despite the procedure, symptoms persisted. He experienced shortness of breath and chest heaviness, especially during physical activities like walking and playing golf. Exercise tolerance has decreased, with difficulty completing 18 holes of golf. Symptoms improve with rest.   Blood pressure is stable at 120-130/70-80 mmHg, and heart rate ranges from 50-100 bpm. He uses a home blood pressure monitor and trusts its accuracy. No smoking history is present. He experienced extra heartbeats, sometimes felt as skipped beats. Nitroglycerin  had not been used recently as symptoms resolve with rest.   Current medications include aspirin , amlodipine , clopidogrel , losartan , and bisoprolol . Metoprolol has not been tried. His wife noted increased daytime sleep since the stent placement, though he maintains a regular sleep schedule at night. He was started on Raxena 500mg  BID at office visit on 5/16 with Dr. Bergamo.   Presented  back to the office on 6/24 with continued episodes of chest pain/anginal symptoms despite attempts to increase medical therapy. Therefore decision was made to proceed with cardiac catheterization was arranged for further evaluation.  Hospital Course     The patient underwent cardiac cath as noted above with successful PCI/DES with arthrectomy of dLM and  p/mLcx. Plan for DAPT with ASA/plavix  for at least 6 months. The patient was seen by cardiac rehab while in short stay. There were no observed complications post cath. Radial cath site was re-evaluated prior to discharge and found to be stable without any complications. Instructions/precautions regarding cath site care were given prior to discharge.  [name] was seen by Dr. Ladona and determined stable for discharge home. Follow up with our office has been arranged. Medications are listed below. Pertinent changes include n/a.  _____________  Cath/PCI Registry Performance & Quality Measures: Aspirin  prescribed? - Yes ADP Receptor Inhibitor (Plavix /Clopidogrel , Brilinta /Ticagrelor  or Effient/Prasugrel) prescribed (includes medically managed patients)? - Yes High Intensity Statin (Lipitor 40-80mg  or Crestor 20-40mg ) prescribed? - No - statin intolerant For EF <40%, was ACEI/ARB prescribed? - Not Applicable (EF >/= 40%) For EF <40%, Aldosterone Antagonist (Spironolactone or Eplerenone) prescribed? - Not Applicable (EF >/= 40%) Cardiac Rehab Phase II ordered (Included Medically managed Patients)? - Yes  _____________   Discharge Vitals Blood pressure 132/64, pulse (!) 48, temperature 97.8 F (36.6 C), temperature source Oral, resp. rate 14, height 5' 7.5 (1.715 m), weight 72.6 kg, SpO2 95%.  Filed Weights   02/29/24 0730  Weight: 72.6 kg    Last Labs & Radiologic Studies    CBC No results for input(s): WBC, NEUTROABS, HGB, HCT, MCV, PLT in the last 72 hours. Basic Metabolic Panel No results for input(s): NA, K, CL, CO2, GLUCOSE, BUN, CREATININE, CALCIUM, MG, PHOS in the last 72 hours. Liver Function Tests No results for input(s): AST, ALT, ALKPHOS, BILITOT, PROT, ALBUMIN in the last 72 hours. No results for input(s): LIPASE, AMYLASE in the last 72 hours. High Sensitivity Troponin:   No results for input(s): TROPONINIHS in the last  720 hours.  BNP Invalid input(s): POCBNP D-Dimer No results for input(s): DDIMER in the last 72 hours. Hemoglobin A1C No results for input(s): HGBA1C in the last 72 hours. Fasting Lipid Panel No results for input(s): CHOL, HDL, LDLCALC, TRIG, CHOLHDL, LDLDIRECT in the last 72 hours. Thyroid  Function Tests No results for input(s): TSH, T4TOTAL, T3FREE, THYROIDAB in the last 72 hours.  Invalid input(s): FREET3 _____________  CARDIAC CATHETERIZATION Result Date: 02/29/2024 Images from the original result were not included. Cardiac Catheterization 02/29/24: Hemodynamic data: LVEDP -1 mmHg.  No pressure gradient across the aortic valve. Angiographic data: LM: Heavily calcified, very short, distal left main and ostial CX heavily calcified 99% stenosis. LAD: Flush occluded in the ostium.  LAD supplied by LIMA which was patent by catheterization December 2024 and hence not cannulated. LCx: Is a small to moderate-sized vessel, there is a ostial 99% stenosis followed by a tandem 99% stenosis after the origin of a small OM1.  Gives origin to a moderate-sized OM 2.  After the origin of OM 2 the CX is occluded.  Distal CX with OM 3 is very large and supplied by SVG. RCA: Occluded in the ostium, not cannulated. SVG to OM 3: Widely patent.  Previously placed 3.0 x 16 mm Synergy XD DES on 08/17/2023 is widely patent.  OM 3 is very large and has multiple secondary branches and gives extensive collaterals to the distal  RCA. LIMA to LAD: Not cannulated.  Patent on prior cardiac catheterization 09/18/2022. Intervention data: Successful PTCA, arthrectomy of distal left main and proximal and mid CX followed by stenting with a 2.75 x 20 mm Synergy XD DES and postdilated with a 3.0 x 10 mm [place] balloon into the ostium and distal left main.  Stenosis reduced from 99% in the distal left main, proximal CX, mid CX to 0% with improvement in TIMI II flow to TIMI-3 flow. Impression and recommendations:  Patient felt immediately better post PCI, stated that his breathing also improved.  Patient will be discharged home today with outpatient follow-up, he will need DAPT for 6 months with aspirin  Plavix .    Disposition   Pt is being discharged home today in good condition.  Follow-up Plans & Appointments     Discharge Instructions     Amb Referral to Cardiac Rehabilitation   Complete by: As directed    Diagnosis:  Coronary Stents PTCA     After initial evaluation and assessments completed: Virtual Based Care may be provided alone or in conjunction with Phase 2 Cardiac Rehab based on patient barriers.: Yes   Intensive Cardiac Rehabilitation (ICR) [facility] location only OR Traditional Cardiac Rehabilitation (TCR) *If criteria for ICR are not met will enroll in TCR [facility] only): Yes        Discharge Medications   Allergies as of 02/29/2024       Reactions   Celecoxib Other (See Comments)   Per Mar   Penicillins Other (See Comments)   Gi upset   Statins    Myalgias on simvastatin  5mg  2x/week, rosuvastatin, pravastatin, and Vytorin    Sulfa Antibiotics Swelling   Sulfamethoxazole Other (See Comments)   hands and feet web        Medication List     TAKE these medications    amLODipine  10 MG tablet Commonly known as: NORVASC  Take 1 tablet (10 mg total) by mouth daily.   aspirin  EC 81 MG tablet Take 1 tablet (81 mg total) by mouth daily. Swallow whole.   bisoprolol  5 MG tablet Commonly known as: ZEBETA  Take 0.5 tablets (2.5 mg total) by mouth daily.   clopidogrel  75 MG tablet Commonly known as: PLAVIX  Take 1 tablet (75 mg total) by mouth daily.   CO Q 10 PO Take 1 tablet by mouth daily.   hyoscyamine  0.125 MG tablet Commonly known as: Levsin  Take 1 tablet (0.125 mg total) by mouth every 4 (four) hours as needed.   isosorbide  mononitrate 30 MG 24 hr tablet Commonly known as: IMDUR  Take 0.5 tablets (15 mg total) by mouth daily.   levothyroxine  75 MCG  tablet Commonly known as: SYNTHROID  Take 1 tablet (75 mcg total) by mouth every morning on an empty stomach.   losartan  25 MG tablet Commonly known as: COZAAR  Take 1 tablet (25 mg total) by mouth daily.   metFORMIN  500 MG 24 hr tablet Commonly known as: GLUCOPHAGE -XR Take 500 mg by mouth 3 (three) times daily.   multivitamin with minerals Tabs tablet Take 1 tablet by mouth daily.   nitroGLYCERIN  0.4 MG SL tablet Commonly known as: Nitrostat  Place 1 tablet (0.4 mg total) under the tongue every 5 (five) minutes as needed.   pantoprazole  40 MG tablet Commonly known as: Protonix  Take 1 tablet (40 mg total) by mouth daily.   predniSONE  5 MG (21) Tbpk tablet Commonly known as: STERAPRED UNI-PAK 21 TAB Take as directed   Repatha  SureClick 140 MG/ML Soaj Generic drug: Evolocumab  Inject  140 mg into the skin every 14 (fourteen) days.   Vitamin D  50 MCG (2000 UT) tablet Take 2,000 Units by mouth daily.           Allergies Allergies  Allergen Reactions   Celecoxib Other (See Comments)    Per Mar   Penicillins Other (See Comments)    Gi upset    Statins     Myalgias on simvastatin  5mg  2x/week, rosuvastatin, pravastatin, and Vytorin    Sulfa Antibiotics Swelling   Sulfamethoxazole Other (See Comments)    hands and feet web    Outstanding Labs/Studies   N/a   Duration of Discharge Encounter   Greater than 30 minutes including physician time.  Signed, Manuelita Rummer, NP 02/29/2024, 3:07 PM

## 2024-02-29 NOTE — Discharge Instructions (Signed)

## 2024-03-01 ENCOUNTER — Encounter (HOSPITAL_COMMUNITY): Payer: Self-pay | Admitting: Cardiology

## 2024-03-07 MED FILL — Evolocumab Subcutaneous Soln Auto-Injector 140 MG/ML: SUBCUTANEOUS | 28 days supply | Qty: 2 | Fill #5 | Status: AC

## 2024-03-12 ENCOUNTER — Other Ambulatory Visit: Payer: Self-pay | Admitting: *Deleted

## 2024-03-12 NOTE — Telephone Encounter (Signed)
 I called cardiac rehab and left message on voicemail

## 2024-03-14 ENCOUNTER — Other Ambulatory Visit (HOSPITAL_BASED_OUTPATIENT_CLINIC_OR_DEPARTMENT_OTHER): Payer: Self-pay

## 2024-03-14 ENCOUNTER — Telehealth (HOSPITAL_COMMUNITY): Payer: Self-pay

## 2024-03-14 ENCOUNTER — Encounter: Payer: Self-pay | Admitting: Cardiology

## 2024-03-14 ENCOUNTER — Ambulatory Visit: Attending: Cardiology | Admitting: Cardiology

## 2024-03-14 VITALS — BP 128/72 | HR 69 | Resp 16 | Ht 67.0 in | Wt 163.6 lb

## 2024-03-14 DIAGNOSIS — I25119 Atherosclerotic heart disease of native coronary artery with unspecified angina pectoris: Secondary | ICD-10-CM | POA: Diagnosis not present

## 2024-03-14 DIAGNOSIS — E782 Mixed hyperlipidemia: Secondary | ICD-10-CM

## 2024-03-14 DIAGNOSIS — I10 Essential (primary) hypertension: Secondary | ICD-10-CM

## 2024-03-14 MED ORDER — METFORMIN HCL ER 500 MG PO TB24
1500.0000 mg | ORAL_TABLET | Freq: Every evening | ORAL | 4 refills | Status: DC
  Filled 2024-03-14: qty 270, 90d supply, fill #0

## 2024-03-14 NOTE — Progress Notes (Signed)
 Cardiology Office Note:  .   Date:  03/14/2024  ID:  Hayden [name], DOB 1946/08/30, MRN [id number] PCP: Sun, Vyvyan, MD  [facility] Providers Cardiologist:  Gordy Bergamo, MD   History of Present Illness: .   Hayden [name] is a 78 y.o. Patient with coronary artery disease, myocardial infarction in 1994 with PCI to LCx, due to multivessel coronary artery disease underwent CABG x 3 in 2000, due to abnormal nuclear stress test underwent PCI and stenting to SVG to OM on 08/17/2023. Has 1 vein graft occluded, has patent LIMA to LAD. Past medical history significant for hypertension, hypercholesterolemia.  In view of lifestyle-limiting claudication class III, failed medical therapy, underwent cardiac catheterization on 02/29/2024 and underwent complex atherectomy of the left main and circumflex coronary artery followed by stenting.  He now presents for follow-up. He feels well and has not had any complications from procedure and has not had angina pectoris.   Discussed the use of AI scribe software for clinical note transcription with the patient, who gave verbal consent to proceed.  History of Present Illness Hayden [name] is a 78 year old male with coronary artery disease who presents for follow-up after angioplasty. He has resumed playing golf and other physical activities without issues. He is currently on aspirin  and Plavix  since the procedure.  His cholesterol is well-controlled with Repatha , maintaining low LDL levels, though triglycerides are slightly elevated. For hypertension, he takes amlodipine  10 mg once daily, bisoprolol  5 mg twice daily, and losartan  25 mg once daily.  Labs   Lab Results  Component Value Date   CHOL 87 (L) 05/18/2023   HDL 35 (L) 05/18/2023   LDLCALC 24 05/18/2023   TRIG 167 (H) 05/18/2023   CHOLHDL 2.5 05/18/2023   No results found for: LIPOA  Lab Results  Component Value Date   NA 142 02/21/2024   K 4.3 02/21/2024   CO2 22 02/21/2024   GLUCOSE 180  (H) 02/21/2024   BUN 15 02/21/2024   CREATININE 0.95 02/21/2024   CALCIUM 10.4 (H) 02/21/2024   GFR 89.96 01/15/2015   EGFR 82 02/21/2024   GFRNONAA 70 10/21/2017      Latest Ref Rng & Units 02/21/2024    9:27 AM 08/15/2023   12:44 PM 10/21/2017    2:04 PM  BMP  Glucose 70 - 99 mg/dL 819  867  97   BUN 8 - 27 mg/dL 15  15  25    Creatinine 0.76 - 1.27 mg/dL 9.04  9.19  8.93   BUN/Creat Ratio 10 - 24 16  19  24    Sodium 134 - 144 mmol/L 142  137  139   Potassium 3.5 - 5.2 mmol/L 4.3  4.3  4.7   Chloride 96 - 106 mmol/L 98  98  100   CO2 20 - 29 mmol/L 22  20  20    Calcium 8.6 - 10.2 mg/dL 89.5  9.8  9.8       Latest Ref Rng & Units 02/21/2024    9:27 AM 08/15/2023   12:44 PM 03/27/2013   10:13 AM  CBC  WBC 3.4 - 10.8 x10E3/uL 8.7  13.3  7.6   Hemoglobin 13.0 - 17.7 g/dL 83.0  83.7  84.5   Hematocrit 37.5 - 51.0 % 50.1  48.5  46.2   Platelets 150 - 450 x10E3/uL 254  205  228.0    Lab Results  Component Value Date   HGBA1C 6.6 (H) 09/04/2012  Lab Results  Component Value Date   TSH 4.353 09/04/2012    ROS  Review of Systems  Cardiovascular:  Negative for chest pain, dyspnea on exertion and leg swelling.   Physical Exam:   VS:  BP 128/72 (BP Location: Left Arm, Patient Position: Sitting, Cuff Size: Normal)   Pulse 69   Resp 16   Ht 5' 7 (1.702 m)   Wt 163 lb 9.6 oz (74.2 kg)   SpO2 97%   BMI 25.62 kg/m    Wt Readings from Last 3 Encounters:  03/14/24 163 lb 9.6 oz (74.2 kg)  02/29/24 160 lb (72.6 kg)  02/21/24 160 lb 12.8 oz (72.9 kg)    Physical Exam Neck:     Vascular: No carotid bruit or JVD.  Cardiovascular:     Rate and Rhythm: Normal rate and regular rhythm.     Pulses: Intact distal pulses.     Heart sounds: Normal heart sounds. No murmur heard.    No gallop.  Pulmonary:     Effort: Pulmonary effort is normal.     Breath sounds: Normal breath sounds.  Abdominal:     General: Bowel sounds are normal.     Palpations: Abdomen is soft.   Musculoskeletal:     Right lower leg: No edema.     Left lower leg: No edema.    Studies Reviewed: .    Cardiac Catheterization 02/29/24:   Arthrectomy of distal left main and proximal and mid CX followed by stenting with a 2.75 x 20 mm Synergy XD DES and postdilated with a 3.0 x 10 mm Wade balloon     Impression and recommendations: Patient felt immediately better post PCI, stated that his breathing also improved.  Patient will be discharged home today with outpatient follow-up, he will need DAPT for 6 months with aspirin  Plavix .   EKG:    EKG Interpretation Date/Time:  Wednesday March 14 2024 16:22:22 EDT Ventricular Rate:  59 PR Interval:  168 QRS Duration:  86 QT Interval:  430 QTC Calculation: 425 R Axis:   23  Text Interpretation: EKG 03/14/2024: Normal sinus rhythm at rate of 59 bpm, left atrial enlargement, normal axis.  Poor R wave progression.  Nonspecific T abnormality, cannot exclude inferolateral ischemia.  Single PVC.  Compared to 02/29/2024, lateral T wave inversion less prominent.  Frequent PVCs not present. Confirmed by Atilano Covelli, Jagadeesh ([id number]) on 03/14/2024 4:44:53 PM    Medications ordered    No orders of the defined types were placed in this encounter.    ASSESSMENT AND PLAN: .      ICD-10-CM   1. Coronary artery disease involving native coronary artery of native heart with angina pectoris (HCC)  I25.119 EKG 12-Lead    2. Primary hypertension  I10     3. Mixed hyperlipidemia  E78.2      Assessment & Plan Coronary artery disease Post-angioplasty status with no recurrence of angina. Well-tolerated medication regimen including aspirin  and Plavix . Continued physical activity including golf. - Continue aspirin  and Plavix  for six months - Discontinue aspirin  after six months - Continue Plavix  indefinitely to prevent further coronary events - Reviewed angiograms with patient and his wife  Hyperlipidemia Cholesterol levels well controlled with Repatha . LDL  levels are low, reducing the risk of progression of coronary artery disease. - Continue Repatha   Hypertriglyceridemia Slightly elevated triglycerides. - Incorporate dietary modifications to reduce triglyceride levels - Add Metamucil, one spoon in a tall glass of water nightly - Add flaxseed flakes to  a main meal daily  Hypertension Blood pressure well controlled with current regimen of amlodipine , bisoprolol , and losartan . - Continue amlodipine  10 mg once daily - Continue bisoprolol  5 mg twice daily - Continue losartan  25 mg once daily OV in 6 months  Signed,  Gordy Bergamo, MD, [facility] 03/14/2024, 9:31 PM [facility] [address] [place], [place] [address] Phone: [phone]. Fax:  [phone]

## 2024-03-14 NOTE — Telephone Encounter (Signed)
 Patient called expressing interest in cardiac rehab. Explained scheduling process and went over insurance, patient verbalized understanding. Will contact patient for scheduling once nurse has review f/u notes from 7/14.

## 2024-03-14 NOTE — Patient Instructions (Signed)

## 2024-03-15 ENCOUNTER — Other Ambulatory Visit (HOSPITAL_BASED_OUTPATIENT_CLINIC_OR_DEPARTMENT_OTHER): Payer: Self-pay

## 2024-03-16 ENCOUNTER — Telehealth (HOSPITAL_COMMUNITY): Payer: Self-pay

## 2024-03-16 NOTE — Telephone Encounter (Signed)
 Pt insurance is active and benefits verified through [employer]. Co-pay $15, DED $0/$0 met, out of pocket $3,400/$407.61 met, co-insurance 0%. No pre-authorization required. 03/16/2024 @ 9:08am, spoke with Lona, REF# [id number].  How many CR sessions are covered? (36 visits for TCR, 72 visits for ICR)72 ICR Is this a lifetime maximum or an annual maximum? Annual Has the member used any of these services to date? No Is there a time limit (weeks/months) on start of program and/or program completion? No

## 2024-03-21 DIAGNOSIS — H25813 Combined forms of age-related cataract, bilateral: Secondary | ICD-10-CM | POA: Diagnosis not present

## 2024-03-21 DIAGNOSIS — E119 Type 2 diabetes mellitus without complications: Secondary | ICD-10-CM | POA: Diagnosis not present

## 2024-03-21 DIAGNOSIS — H52203 Unspecified astigmatism, bilateral: Secondary | ICD-10-CM | POA: Diagnosis not present

## 2024-03-21 DIAGNOSIS — H5213 Myopia, bilateral: Secondary | ICD-10-CM | POA: Diagnosis not present

## 2024-03-21 MED FILL — Amlodipine Besylate Tab 10 MG (Base Equivalent): ORAL | 90 days supply | Qty: 90 | Fill #2 | Status: AC

## 2024-03-23 ENCOUNTER — Telehealth (HOSPITAL_COMMUNITY): Payer: Self-pay

## 2024-03-23 NOTE — Telephone Encounter (Signed)
 Attempted to call patient to schedule cardiac rehab- no answer, left message. Sent MyChart message.

## 2024-03-23 NOTE — Telephone Encounter (Signed)
 Patient called back to get scheduled in the Cardiac Rehab Program. Patient will come in for orientation on 7/30 (approved by [facility]) and will attend the 6:45 exercise class.  Sent MyChart message.

## 2024-03-28 ENCOUNTER — Encounter (HOSPITAL_COMMUNITY)
Admission: RE | Admit: 2024-03-28 | Discharge: 2024-03-28 | Disposition: A | Discharge status: Home / Self Care | Source: Ambulatory Visit | Attending: Cardiology | Admitting: Cardiology

## 2024-03-28 VITALS — BP 130/62 | HR 87 | Ht 67.5 in | Wt 164.7 lb

## 2024-03-28 DIAGNOSIS — Z955 Presence of coronary angioplasty implant and graft: Secondary | ICD-10-CM | POA: Insufficient documentation

## 2024-03-28 NOTE — Progress Notes (Signed)
 Cardiac Rehab Medication Review   Does the patient  feel that his/her medications are working for him/her? Yes    Has the patient been experiencing any side effects to the medications prescribed? No  Does the patient measure his/her own blood pressure or blood glucose at home?   Yes  Does the patient have any problems obtaining medications due to transportation or finances?   No  Understanding of regimen: excellent Understanding of indications: good Potential of compliance: good    Comments: Hayden [name] understands his medications and regime well. He takes his BP and weight daily, CBGs 2-3x/week.    Con Hayden Pereyra, MS, ACSM-CEP 03/28/2024 8:56 AM

## 2024-03-28 NOTE — Progress Notes (Signed)
 Cardiac Individual Treatment Plan  Patient Details  Name: Hayden [name] MRN: [id number] Date of Birth: August 03, 1946 Referring Provider:   Flowsheet Row INTENSIVE CARDIAC REHAB ORIENT from 03/28/2024 in [facility]  Referring Provider Gordy Bergamo, MD    Initial Encounter Date:  Flowsheet Row INTENSIVE CARDIAC REHAB ORIENT from 03/28/2024 in [facility]  Date 03/28/24    Visit Diagnosis: 02/29/24 DES LM & LCx  Patient's Home Medications on Admission:  Current Outpatient Medications:    amLODipine  (NORVASC ) 10 MG tablet, Take 1 tablet (10 mg total) by mouth daily., Disp: 90 tablet, Rfl: 3   ascorbic acid (VITAMIN C) 1000 MG tablet, Take 500 mg by mouth daily., Disp: , Rfl:    aspirin  EC 81 MG tablet, Take 1 tablet (81 mg total) by mouth daily. Swallow whole., Disp: , Rfl:    Bexagliflozin (BRENZAVVY) 20 MG TABS, Take 1 tablet by mouth daily., Disp: , Rfl:    bisoprolol  (ZEBETA ) 5 MG tablet, Take 0.5 tablets (2.5 mg total) by mouth daily., Disp: 45 tablet, Rfl: 1   Cholecalciferol  (VITAMIN D ) 2000 UNITS tablet, Take 2,000 Units by mouth daily., Disp: , Rfl:    clopidogrel  (PLAVIX ) 75 MG tablet, Take 1 tablet (75 mg total) by mouth daily., Disp: 90 tablet, Rfl: 3   Coenzyme Q10 (CO Q 10 PO), Take 1 tablet by mouth daily., Disp: , Rfl:    Evolocumab  (REPATHA  SURECLICK) 140 MG/ML SOAJ, Inject 140 mg into the skin every 14 (fourteen) days., Disp: 2 mL, Rfl: 11   hyoscyamine  (LEVSIN ) 0.125 MG tablet, Take 1 tablet (0.125 mg total) by mouth every 4 (four) hours as needed., Disp: 30 tablet, Rfl: 1   levothyroxine  (SYNTHROID ) 75 MCG tablet, Take 1 tablet (75 mcg total) by mouth every morning on an empty stomach., Disp: 90 tablet, Rfl: 3   losartan  (COZAAR ) 25 MG tablet, Take 1 tablet (25 mg total) by mouth daily., Disp: 90 tablet, Rfl: 4   metFORMIN  (GLUCOPHAGE -XR) 500 MG 24 hr tablet,  Take 500 mg by mouth 3 (three) times daily., Disp: , Rfl:    metFORMIN  (GLUCOPHAGE -XR) 500 MG 24 hr tablet, Take 3 tablets (1,500 mg total) by mouth every evening with a meal., Disp: 270 tablet, Rfl: 4   Multiple Vitamin (MULTIVITAMIN WITH MINERALS) TABS, Take 1 tablet by mouth daily., Disp: , Rfl:    nitroGLYCERIN  (NITROSTAT ) 0.4 MG SL tablet, Place 1 tablet (0.4 mg total) under the tongue every 5 (five) minutes as needed., Disp: 25 tablet, Rfl: 2   pantoprazole  (PROTONIX ) 40 MG tablet, Take 1 tablet (40 mg total) by mouth daily., Disp: 90 tablet, Rfl: 3   predniSONE  (STERAPRED UNI-PAK 21 TAB) 5 MG (21) TBPK tablet, Take as directed (Patient not taking: Reported on 03/28/2024), Disp: 21 each, Rfl: 1  Past Medical History: Past Medical History:  Diagnosis Date   Anxiety disorder    No details   Aortic root dilatation (HCC)    borderline at 38mm by echo 08/2023   Arthritis    Bright's disease age 88   Carotid artery stenosis    1-39% stenosis bilateraly by dopplers 2020   Chronic combined systolic (congestive) and diastolic (congestive) heart failure (HCC)    EF 40-45% by evho 08/2023   Coronary artery disease    MI in 1994 with PTCA of left circ and then coronary bypass 2000   Frequent urination  H/O hiatal hernia    Hyperlipidemia    statin intolerant   Hypertension    Hypogonadism male    No details available but receives replacement therapy   Impaired hearing    both ears   Kidney stones    100  in past plus stones   Myocardial infarction (HCC) 10/31/1989   Opiate addiction (HCC)    PONV (postoperative nausea and vomiting)    pt prefers epidural is possible   Pulmonary regurgitation    moderate by echo 10/2019    Tobacco Use: Social History   Tobacco Use  Smoking Status Never  Smokeless Tobacco Never    Labs: Review Flowsheet  More data exists      Latest Ref Rng & Units 07/13/2016 04/13/2021 06/24/2021 02/02/2023 05/18/2023  Labs for ITP Cardiac and Pulmonary Rehab   Cholestrol 100 - 199 mg/dL 804  777  66  79  87   LDL (calc) 0 - 99 mg/dL 861  832  17  18  24    HDL-C >39 mg/dL 27  26  27  31   35   Trlycerides 0 - 149 mg/dL 851  843  880  811  832     Capillary Blood Glucose: Lab Results  Component Value Date   GLUCAP 129 (H) 02/29/2024   GLUCAP 174 (H) 02/29/2024   GLUCAP 169 (H) 08/17/2023     Exercise Target Goals: Exercise Program Goal: Individual exercise prescription set using results from initial 6 min walk test and THRR while considering  patient's activity barriers and safety.   Exercise Prescription Goal: Initial exercise prescription builds to 30-45 minutes a day of aerobic activity, 2-3 days per week.  Home exercise guidelines will be given to patient during program as part of exercise prescription that the participant will acknowledge.  Activity Barriers & Risk Stratification:  Activity Barriers & Cardiac Risk Stratification - 03/28/24 1001       Activity Barriers & Cardiac Risk Stratification   Activity Barriers Arthritis;Joint Problems;Right Knee Replacement;Decreased Ventricular Function    Cardiac Risk Stratification High   <5 METs on         6 Minute Walk:  6 Minute Walk     Row Name 03/28/24 1000         6 Minute Walk   Phase Initial     Distance 1250 feet     Walk Time 6 minutes     # of Rest Breaks 0     MPH 2.37     METS 2.71     RPE 10     Perceived Dyspnea  0     VO2 Peak 9.47     Symptoms No     Resting HR 87 bpm     Resting BP 130/62     Resting Oxygen Saturation  98 %     Exercise Oxygen Saturation  during 6 min walk 98 %     Max Ex. HR 105 bpm     Max Ex. BP 150/62     2 Minute Post BP 124/66        Oxygen Initial Assessment:   Oxygen Re-Evaluation:   Oxygen Discharge (Final Oxygen Re-Evaluation):   Initial Exercise Prescription:  Initial Exercise Prescription - 03/28/24 1000       Date of Initial Exercise RX and Referring Provider   Date 03/28/24    Referring Provider  Gordy Bergamo, MD    Expected Discharge Date 06/20/24      Treadmill  MPH 2    Grade 0    Minutes 15    METs 2.5      Recumbant Elliptical   Level 1    RPM 50    Watts 70    Minutes 15    METs 2      Prescription Details   Frequency (times per week) 3    Duration Progress to 30 minutes of continuous aerobic without signs/symptoms of physical distress      Intensity   THRR 40-80% of Max Heartrate 57-114    Ratings of Perceived Exertion 11-13    Perceived Dyspnea 0-4      Progression   Progression Continue progressive overload as per policy without signs/symptoms or physical distress.      Resistance Training   Training Prescription Yes    Weight 3    Reps 10-15          Perform Capillary Blood Glucose checks as needed.  Exercise Prescription Changes:   Exercise Comments:   Exercise Goals and Review:   Exercise Goals     Row Name 03/28/24 1002             Exercise Goals   Increase Physical Activity Yes       Intervention Develop an individualized exercise prescription for aerobic and resistive training based on initial evaluation findings, risk stratification, comorbidities and participant's personal goals.;Provide advice, education, support and counseling about physical activity/exercise needs.       Expected Outcomes Short Term: Attend rehab on a regular basis to increase amount of physical activity.;Long Term: Exercising regularly at least 3-5 days a week.;Long Term: Add in home exercise to make exercise part of routine and to increase amount of physical activity.       Increase Strength and Stamina Yes       Intervention Provide advice, education, support and counseling about physical activity/exercise needs.;Develop an individualized exercise prescription for aerobic and resistive training based on initial evaluation findings, risk stratification, comorbidities and participant's personal goals.       Expected Outcomes Short Term: Perform resistance  training exercises routinely during rehab and add in resistance training at home;Short Term: Increase workloads from initial exercise prescription for resistance, speed, and METs.;Long Term: Improve cardiorespiratory fitness, muscular endurance and strength as measured by increased METs and functional capacity ( )       Able to understand and use rate of perceived exertion (RPE) scale Yes       Intervention Provide education and explanation on how to use RPE scale       Expected Outcomes Short Term: Able to use RPE daily in rehab to express subjective intensity level;Long Term:  Able to use RPE to guide intensity level when exercising independently       Knowledge and understanding of Target Heart Rate Range (THRR) Yes       Intervention Provide education and explanation of THRR including how the numbers were predicted and where they are located for reference       Expected Outcomes Long Term: Able to use THRR to govern intensity when exercising independently;Short Term: Able to state/look up THRR;Short Term: Able to use daily as guideline for intensity in rehab       Understanding of Exercise Prescription Yes       Intervention Provide education, explanation, and written materials on patient's individual exercise prescription       Expected Outcomes Short Term: Able to explain program exercise prescription;Long Term: Able to explain home exercise  prescription to exercise independently          Exercise Goals Re-Evaluation :   Discharge Exercise Prescription (Final Exercise Prescription Changes):   Nutrition:  Target Goals: Understanding of nutrition guidelines, daily intake of sodium 1500mg , cholesterol 200mg , calories 30% from fat and 7% or less from saturated fats, daily to have 5 or more servings of fruits and vegetables.  Biometrics:  Pre Biometrics - 03/28/24 0852       Pre Biometrics   Waist Circumference 38.5 inches    Hip Circumference 39 inches    Waist to Hip Ratio 0.99 %     Triceps Skinfold 15 mm    % Body Fat 26.4 %    Grip Strength 26 kg    Flexibility 12.25 in    Single Leg Stand 30 seconds           Nutrition Therapy Plan and Nutrition Goals:   Nutrition Assessments:  MEDIFICTS Score Key: >=70 Need to make dietary changes  40-70 Heart Healthy Diet <= 40 Therapeutic Level Cholesterol Diet    Picture Your Plate Scores: <59 Unhealthy dietary pattern with much room for improvement. 41-50 Dietary pattern unlikely to meet recommendations for good health and room for improvement. 51-60 More healthful dietary pattern, with some room for improvement.  >60 Healthy dietary pattern, although there may be some specific behaviors that could be improved.    Nutrition Goals Re-Evaluation:   Nutrition Goals Re-Evaluation:   Nutrition Goals Discharge (Final Nutrition Goals Re-Evaluation):   Psychosocial: Target Goals: Acknowledge presence or absence of significant depression and/or stress, maximize coping skills, provide positive support system. Participant is able to verbalize types and ability to use techniques and skills needed for reducing stress and depression.  Initial Review & Psychosocial Screening:  Initial Psych Review & Screening - 03/28/24 0857       Initial Review   Current issues with Current Stress Concerns    Source of Stress Concerns Family    Comments Lakoda shared that he has some stress regarding his children and grandchildren, specifically his granddaughter. He stated that he worries about them more than anything else, but not to a degree that's overwhelming. He denies any depression/anxiety or need for additional resources.      Family Dynamics   Good Support System? Yes   wife     Barriers   Psychosocial barriers to participate in program The patient should benefit from training in stress management and relaxation.      Screening Interventions   Interventions Provide feedback about the scores to participant;To provide  support and resources with identified psychosocial needs;Encouraged to exercise    Expected Outcomes Short Term goal: Identification and review with participant of any Quality of Life or Depression concerns found by scoring the questionnaire.;Long Term goal: The participant improves quality of Life and PHQ9 Scores as seen by post scores and/or verbalization of changes;Long Term Goal: Stressors or current issues are controlled or eliminated.          Quality of Life Scores:  Quality of Life - 03/28/24 1003       Quality of Life   Select Quality of Life      Quality of Life Scores   Health/Function Pre 27.5 %    Socioeconomic Pre 29.06 %    Psych/Spiritual Pre 30 %    Family Pre 28.8 %    GLOBAL Pre 28.54 %         Scores of 19 and below usually indicate a  poorer quality of life in these areas.  A difference of  2-3 points is a clinically meaningful difference.  A difference of 2-3 points in the total score of the Quality of Life Index has been associated with significant improvement in overall quality of life, self-image, physical symptoms, and general health in studies assessing change in quality of life.  PHQ-9: Review Flowsheet       03/28/2024  Depression screen PHQ 2/9  Decreased Interest 0  Down, Depressed, Hopeless 0  PHQ - 2 Score 0  Altered sleeping 0  Tired, decreased energy 0  Change in appetite 0  Feeling bad or failure about yourself  0  Trouble concentrating 0  Moving slowly or fidgety/restless 0  Suicidal thoughts 0  PHQ-9 Score 0   Interpretation of Total Score  Total Score Depression Severity:  1-4 = Minimal depression, 5-9 = Mild depression, 10-14 = Moderate depression, 15-19 = Moderately severe depression, 20-27 = Severe depression   Psychosocial Evaluation and Intervention:   Psychosocial Re-Evaluation:   Psychosocial Discharge (Final Psychosocial Re-Evaluation):   Vocational Rehabilitation: Provide vocational rehab assistance to qualifying  candidates.   Vocational Rehab Evaluation & Intervention:  Vocational Rehab - 03/28/24 1005       Initial Vocational Rehab Evaluation & Intervention   Assessment shows need for Vocational Rehabilitation No   works part time         Education: Education Goals: Education classes will be provided on a weekly basis, covering required topics. Participant will state understanding/return demonstration of topics presented.     Core Videos: Exercise    Move It!  Clinical staff conducted group or individual video education with verbal and written material and guidebook.  Patient learns the recommended Pritikin exercise program. Exercise with the goal of living a long, healthy life. Some of the health benefits of exercise include controlled diabetes, healthier blood pressure levels, improved cholesterol levels, improved heart and lung capacity, improved sleep, and better body composition. Everyone should speak with their doctor before starting or changing an exercise routine.  Biomechanical Limitations Clinical staff conducted group or individual video education with verbal and written material and guidebook.  Patient learns how biomechanical limitations can impact exercise and how we can mitigate and possibly overcome limitations to have an impactful and balanced exercise routine.  Body Composition Clinical staff conducted group or individual video education with verbal and written material and guidebook.  Patient learns that body composition (ratio of muscle mass to fat mass) is a key component to assessing overall fitness, rather than body weight alone. Increased fat mass, especially visceral belly fat, can put us  at increased risk for metabolic syndrome, type 2 diabetes, heart disease, and even death. It is recommended to combine diet and exercise (cardiovascular and resistance training) to improve your body composition. Seek guidance from your physician and exercise physiologist before  implementing an exercise routine.  Exercise Action Plan Clinical staff conducted group or individual video education with verbal and written material and guidebook.  Patient learns the recommended strategies to achieve and enjoy long-term exercise adherence, including variety, self-motivation, self-efficacy, and positive decision making. Benefits of exercise include fitness, good health, weight management, more energy, better sleep, less stress, and overall well-being.  Medical   Heart Disease Risk Reduction Clinical staff conducted group or individual video education with verbal and written material and guidebook.  Patient learns our heart is our most vital organ as it circulates oxygen, nutrients, white blood cells, and hormones throughout the entire body, and  carries waste away. Data supports a plant-based eating plan like the Pritikin Program for its effectiveness in slowing progression of and reversing heart disease. The video provides a number of recommendations to address heart disease.   Metabolic Syndrome and Belly Fat  Clinical staff conducted group or individual video education with verbal and written material and guidebook.  Patient learns what metabolic syndrome is, how it leads to heart disease, and how one can reverse it and keep it from coming back. You have metabolic syndrome if you have 3 of the following 5 criteria: abdominal obesity, high blood pressure, high triglycerides, low HDL cholesterol, and high blood sugar.  Hypertension and Heart Disease Clinical staff conducted group or individual video education with verbal and written material and guidebook.  Patient learns that high blood pressure, or hypertension, is very common in the [place] . Hypertension is largely due to excessive salt intake, but other important risk factors include being overweight, physical inactivity, drinking too much alcohol, smoking, and not eating enough potassium from fruits and vegetables. High  blood pressure is a leading risk factor for heart attack, stroke, congestive heart failure, dementia, kidney failure, and premature death. Long-term effects of excessive salt intake include stiffening of the arteries and thickening of heart muscle and organ damage. Recommendations include ways to reduce hypertension and the risk of heart disease.  Diseases of Our Time - Focusing on Diabetes Clinical staff conducted group or individual video education with verbal and written material and guidebook.  Patient learns why the best way to stop diseases of our time is prevention, through food and other lifestyle changes. Medicine (such as prescription pills and surgeries) is often only a Band-Aid on the problem, not a long-term solution. Most common diseases of our time include obesity, type 2 diabetes, hypertension, heart disease, and cancer. The Pritikin Program is recommended and has been proven to help reduce, reverse, and/or prevent the damaging effects of metabolic syndrome.  Nutrition   Overview of the Pritikin Eating Plan  Clinical staff conducted group or individual video education with verbal and written material and guidebook.  Patient learns about the Pritikin Eating Plan for disease risk reduction. The Pritikin Eating Plan emphasizes a wide variety of unrefined, minimally-processed carbohydrates, like fruits, vegetables, whole grains, and legumes. Go, Caution, and Stop food choices are explained. Plant-based and lean animal proteins are emphasized. Rationale provided for low sodium intake for blood pressure control, low added sugars for blood sugar stabilization, and low added fats and oils for coronary artery disease risk reduction and weight management.  Calorie Density  Clinical staff conducted group or individual video education with verbal and written material and guidebook.  Patient learns about calorie density and how it impacts the Pritikin Eating Plan. Knowing the characteristics of the  food you choose will help you decide whether those foods will lead to weight gain or weight loss, and whether you want to consume more or less of them. Weight loss is usually a side effect of the Pritikin Eating Plan because of its focus on low calorie-dense foods.  Label Reading  Clinical staff conducted group or individual video education with verbal and written material and guidebook.  Patient learns about the Pritikin recommended label reading guidelines and corresponding recommendations regarding calorie density, added sugars, sodium content, and whole grains.  Dining Out - Part 1  Clinical staff conducted group or individual video education with verbal and written material and guidebook.  Patient learns that restaurant meals can be sabotaging because they can  be so high in calories, fat, sodium, and/or sugar. Patient learns recommended strategies on how to positively address this and avoid unhealthy pitfalls.  Facts on Fats  Clinical staff conducted group or individual video education with verbal and written material and guidebook.  Patient learns that lifestyle modifications can be just as effective, if not more so, as many medications for lowering your risk of heart disease. A Pritikin lifestyle can help to reduce your risk of inflammation and atherosclerosis (cholesterol build-up, or plaque, in the artery walls). Lifestyle interventions such as dietary choices and physical activity address the cause of atherosclerosis. A review of the types of fats and their impact on blood cholesterol levels, along with dietary recommendations to reduce fat intake is also included.  Nutrition Action Plan  Clinical staff conducted group or individual video education with verbal and written material and guidebook.  Patient learns how to incorporate Pritikin recommendations into their lifestyle. Recommendations include planning and keeping personal health goals in mind as an important part of their  success.  Healthy Mind-Set    Healthy Minds, Bodies, Hearts  Clinical staff conducted group or individual video education with verbal and written material and guidebook.  Patient learns how to identify when they are stressed. Video will discuss the impact of that stress, as well as the many benefits of stress management. Patient will also be introduced to stress management techniques. The way we think, act, and feel has an impact on our hearts.  How Our Thoughts Can Heal Our Hearts  Clinical staff conducted group or individual video education with verbal and written material and guidebook.  Patient learns that negative thoughts can cause depression and anxiety. This can result in negative lifestyle behavior and serious health problems. Cognitive behavioral therapy is an effective method to help control our thoughts in order to change and improve our emotional outlook.  Additional Videos:  Exercise    Improving Performance  Clinical staff conducted group or individual video education with verbal and written material and guidebook.  Patient learns to use a non-linear approach by alternating intensity levels and lengths of time spent exercising to help burn more calories and lose more body fat. Cardiovascular exercise helps improve heart health, metabolism, hormonal balance, blood sugar control, and recovery from fatigue. Resistance training improves strength, endurance, balance, coordination, reaction time, metabolism, and muscle mass. Flexibility exercise improves circulation, posture, and balance. Seek guidance from your physician and exercise physiologist before implementing an exercise routine and learn your capabilities and proper form for all exercise.  Introduction to Yoga  Clinical staff conducted group or individual video education with verbal and written material and guidebook.  Patient learns about yoga, a discipline of the coming together of mind, breath, and body. The benefits of yoga  include improved flexibility, improved range of motion, better posture and core strength, increased lung function, weight loss, and positive self-image. Yoga's heart health benefits include lowered blood pressure, healthier heart rate, decreased cholesterol and triglyceride levels, improved immune function, and reduced stress. Seek guidance from your physician and exercise physiologist before implementing an exercise routine and learn your capabilities and proper form for all exercise.  Medical   Aging: Enhancing Your Quality of Life  Clinical staff conducted group or individual video education with verbal and written material and guidebook.  Patient learns key strategies and recommendations to stay in good physical health and enhance quality of life, such as prevention strategies, having an advocate, securing a Health Care Proxy and Power of [place], and keeping  a list of medications and system for tracking them. It also discusses how to avoid risk for bone loss.  Biology of Weight Control  Clinical staff conducted group or individual video education with verbal and written material and guidebook.  Patient learns that weight gain occurs because we consume more calories than we burn (eating more, moving less). Even if your body weight is normal, you may have higher ratios of fat compared to muscle mass. Too much body fat puts you at increased risk for cardiovascular disease, heart attack, stroke, type 2 diabetes, and obesity-related cancers. In addition to exercise, following the Pritikin Eating Plan can help reduce your risk.  Decoding Lab Results  Clinical staff conducted group or individual video education with verbal and written material and guidebook.  Patient learns that lab test reflects one measurement whose values change over time and are influenced by many factors, including medication, stress, sleep, exercise, food, hydration, pre-existing medical conditions, and more. It is recommended to  use the knowledge from this video to become more involved with your lab results and evaluate your numbers to speak with your doctor.   Diseases of Our Time - Overview  Clinical staff conducted group or individual video education with verbal and written material and guidebook.  Patient learns that according to the CDC, 50% to 70% of chronic diseases (such as obesity, type 2 diabetes, elevated lipids, hypertension, and heart disease) are avoidable through lifestyle improvements including healthier food choices, listening to satiety cues, and increased physical activity.  Sleep Disorders Clinical staff conducted group or individual video education with verbal and written material and guidebook.  Patient learns how good quality and duration of sleep are important to overall health and well-being. Patient also learns about sleep disorders and how they impact health along with recommendations to address them, including discussing with a physician.  Nutrition  Dining Out - Part 2 Clinical staff conducted group or individual video education with verbal and written material and guidebook.  Patient learns how to plan ahead and communicate in order to maximize their dining experience in a healthy and nutritious manner. Included are recommended food choices based on the type of restaurant the patient is visiting.   Fueling a Banker conducted group or individual video education with verbal and written material and guidebook.  There is a strong connection between our food choices and our health. Diseases like obesity and type 2 diabetes are very prevalent and are in large-part due to lifestyle choices. The Pritikin Eating Plan provides plenty of food and hunger-curbing satisfaction. It is easy to follow, affordable, and helps reduce health risks.  Menu Workshop  Clinical staff conducted group or individual video education with verbal and written material and guidebook.  Patient learns  that restaurant meals can sabotage health goals because they are often packed with calories, fat, sodium, and sugar. Recommendations include strategies to plan ahead and to communicate with the manager, chef, or server to help order a healthier meal.  Planning Your Eating Strategy  Clinical staff conducted group or individual video education with verbal and written material and guidebook.  Patient learns about the Pritikin Eating Plan and its benefit of reducing the risk of disease. The Pritikin Eating Plan does not focus on calories. Instead, it emphasizes high-quality, nutrient-rich foods. By knowing the characteristics of the foods, we choose, we can determine their calorie density and make informed decisions.  Targeting Your Nutrition Priorities  Clinical staff conducted group or individual video education with  verbal and written material and guidebook.  Patient learns that lifestyle habits have a tremendous impact on disease risk and progression. This video provides eating and physical activity recommendations based on your personal health goals, such as reducing LDL cholesterol, losing weight, preventing or controlling type 2 diabetes, and reducing high blood pressure.  Vitamins and Minerals  Clinical staff conducted group or individual video education with verbal and written material and guidebook.  Patient learns different ways to obtain key vitamins and minerals, including through a recommended healthy diet. It is important to discuss all supplements you take with your doctor.   Healthy Mind-Set    Smoking Cessation  Clinical staff conducted group or individual video education with verbal and written material and guidebook.  Patient learns that cigarette smoking and tobacco addiction pose a serious health risk which affects millions of people. Stopping smoking will significantly reduce the risk of heart disease, lung disease, and many forms of cancer. Recommended strategies for quitting  are covered, including working with your doctor to develop a successful plan.  Culinary   Becoming a Set designer conducted group or individual video education with verbal and written material and guidebook.  Patient learns that cooking at home can be healthy, cost-effective, quick, and puts them in control. Keys to cooking healthy recipes will include looking at your recipe, assessing your equipment needs, planning ahead, making it simple, choosing cost-effective seasonal ingredients, and limiting the use of added fats, salts, and sugars.  Cooking - Breakfast and Snacks  Clinical staff conducted group or individual video education with verbal and written material and guidebook.  Patient learns how important breakfast is to satiety and nutrition through the entire day. Recommendations include key foods to eat during breakfast to help stabilize blood sugar levels and to prevent overeating at meals later in the day. Planning ahead is also a key component.  Cooking - Educational psychologist conducted group or individual video education with verbal and written material and guidebook.  Patient learns eating strategies to improve overall health, including an approach to cook more at home. Recommendations include thinking of animal protein as a side on your plate rather than center stage and focusing instead on lower calorie dense options like vegetables, fruits, whole grains, and plant-based proteins, such as beans. Making sauces in large quantities to freeze for later and leaving the skin on your vegetables are also recommended to maximize your experience.  Cooking - Healthy Salads and Dressing Clinical staff conducted group or individual video education with verbal and written material and guidebook.  Patient learns that vegetables, fruits, whole grains, and legumes are the foundations of the Pritikin Eating Plan. Recommendations include how to incorporate each of these in  flavorful and healthy salads, and how to create homemade salad dressings. Proper handling of ingredients is also covered. Cooking - Soups and [employer] - Soups and Desserts Clinical staff conducted group or individual video education with verbal and written material and guidebook.  Patient learns that Pritikin soups and desserts make for easy, nutritious, and delicious snacks and meal components that are low in sodium, fat, sugar, and calorie density, while high in vitamins, minerals, and filling fiber. Recommendations include simple and healthy ideas for soups and desserts.   Overview     The Pritikin Solution Program Overview Clinical staff conducted group or individual video education with verbal and written material and guidebook.  Patient learns that the results of the Pritikin Program have been documented  in more than 100 articles published in peer-reviewed journals, and the benefits include reducing risk factors for (and, in some cases, even reversing) high cholesterol, high blood pressure, type 2 diabetes, obesity, and more! An overview of the three key pillars of the Pritikin Program will be covered: eating well, doing regular exercise, and having a healthy mind-set.  WORKSHOPS  Exercise: Exercise Basics: Building Your Action Plan Clinical staff led group instruction and group discussion with PowerPoint presentation and patient guidebook. To enhance the learning environment the use of posters, models and videos may be added. At the conclusion of this workshop, patients will comprehend the difference between physical activity and exercise, as well as the benefits of incorporating both, into their routine. Patients will understand the FITT (Frequency, Intensity, Time, and Type) principle and how to use it to build an exercise action plan. In addition, safety concerns and other considerations for exercise and cardiac rehab will be addressed by the presenter. The purpose of this  lesson is to promote a comprehensive and effective weekly exercise routine in order to improve patients' overall level of fitness.   Managing Heart Disease: Your Path to a Healthier Heart Clinical staff led group instruction and group discussion with PowerPoint presentation and patient guidebook. To enhance the learning environment the use of posters, models and videos may be added.At the conclusion of this workshop, patients will understand the anatomy and physiology of the heart. Additionally, they will understand how Pritikin's three pillars impact the risk factors, the progression, and the management of heart disease.  The purpose of this lesson is to provide a high-level overview of the heart, heart disease, and how the Pritikin lifestyle positively impacts risk factors.  Exercise Biomechanics Clinical staff led group instruction and group discussion with PowerPoint presentation and patient guidebook. To enhance the learning environment the use of posters, models and videos may be added. Patients will learn how the structural parts of their bodies function and how these functions impact their daily activities, movement, and exercise. Patients will learn how to promote a neutral spine, learn how to manage pain, and identify ways to improve their physical movement in order to promote healthy living. The purpose of this lesson is to expose patients to common physical limitations that impact physical activity. Participants will learn practical ways to adapt and manage aches and pains, and to minimize their effect on regular exercise. Patients will learn how to maintain good posture while sitting, walking, and lifting.  Balance Training and Fall Prevention  Clinical staff led group instruction and group discussion with PowerPoint presentation and patient guidebook. To enhance the learning environment the use of posters, models and videos may be added. At the conclusion of this workshop,  patients will understand the importance of their sensorimotor skills (vision, proprioception, and the vestibular system) in maintaining their ability to balance as they age. Patients will apply a variety of balancing exercises that are appropriate for their current level of function. Patients will understand the common causes for poor balance, possible solutions to these problems, and ways to modify their physical environment in order to minimize their fall risk. The purpose of this lesson is to teach patients about the importance of maintaining balance as they age and ways to minimize their risk of falling.  WORKSHOPS   Nutrition:  Fueling a Ship broker led group instruction and group discussion with PowerPoint presentation and patient guidebook. To enhance the learning environment the use of posters, models and videos may be added. Patients  will review the foundational principles of the Pritikin Eating Plan and understand what constitutes a serving size in each of the food groups. Patients will also learn Pritikin-friendly foods that are better choices when away from home and review make-ahead meal and snack options. Calorie density will be reviewed and applied to three nutrition priorities: weight maintenance, weight loss, and weight gain. The purpose of this lesson is to reinforce (in a group setting) the key concepts around what patients are recommended to eat and how to apply these guidelines when away from home by planning and selecting Pritikin-friendly options. Patients will understand how calorie density may be adjusted for different weight management goals.  Mindful Eating  Clinical staff led group instruction and group discussion with PowerPoint presentation and patient guidebook. To enhance the learning environment the use of posters, models and videos may be added. Patients will briefly review the concepts of the Pritikin Eating Plan and the importance of low-calorie dense  foods. The concept of mindful eating will be introduced as well as the importance of paying attention to internal hunger signals. Triggers for non-hunger eating and techniques for dealing with triggers will be explored. The purpose of this lesson is to provide patients with the opportunity to review the basic principles of the Pritikin Eating Plan, discuss the value of eating mindfully and how to measure internal cues of hunger and fullness using the Hunger Scale. Patients will also discuss reasons for non-hunger eating and learn strategies to use for controlling emotional eating.  Targeting Your Nutrition Priorities Clinical staff led group instruction and group discussion with PowerPoint presentation and patient guidebook. To enhance the learning environment the use of posters, models and videos may be added. Patients will learn how to determine their genetic susceptibility to disease by reviewing their family history. Patients will gain insight into the importance of diet as part of an overall healthy lifestyle in mitigating the impact of genetics and other environmental insults. The purpose of this lesson is to provide patients with the opportunity to assess their personal nutrition priorities by looking at their family history, their own health history and current risk factors. Patients will also be able to discuss ways of prioritizing and modifying the Pritikin Eating Plan for their highest risk areas  Menu  Clinical staff led group instruction and group discussion with PowerPoint presentation and patient guidebook. To enhance the learning environment the use of posters, models and videos may be added. Using menus brought in from [employer], or printed from [employer], patients will apply the Pritikin dining out guidelines that were presented in the [employer] video. Patients will also be able to practice these guidelines in a variety of provided scenarios. The purpose of  this lesson is to provide patients with the opportunity to practice hands-on learning of the Pritikin Dining Out guidelines with actual menus and practice scenarios.  Label Reading Clinical staff led group instruction and group discussion with PowerPoint presentation and patient guidebook. To enhance the learning environment the use of posters, models and videos may be added. Patients will review and discuss the Pritikin label reading guidelines presented in Pritikin's Label Reading Educational series video. Using fool labels brought in from local grocery stores and markets, patients will apply the label reading guidelines and determine if the packaged food meet the Pritikin guidelines. The purpose of this lesson is to provide patients with the opportunity to review, discuss, and practice hands-on learning of the Pritikin Label Reading guidelines with actual packaged food  labels. Cooking School  Pritikin's [employer] are designed to teach patients ways to prepare quick, simple, and affordable recipes at home. The importance of nutrition's role in chronic disease risk reduction is reflected in its emphasis in the overall Pritikin program. By learning how to prepare essential core Pritikin Eating Plan recipes, patients will increase control over what they eat; be able to customize the flavor of foods without the use of added salt, sugar, or fat; and improve the quality of the food they consume. By learning a set of core recipes which are easily assembled, quickly prepared, and affordable, patients are more likely to prepare more healthy foods at home. These workshops focus on convenient breakfasts, simple entres, side dishes, and desserts which can be prepared with minimal effort and are consistent with nutrition recommendations for cardiovascular risk reduction. 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 The  chef or RD has a clear understanding of the importance of minimizing - if not completely eliminating - added fat, sugar, and sodium in recipes. Throughout the series of Cooking School Workshop sessions, patients will learn about healthy ingredients and efficient methods of cooking to build confidence in their capability to prepare    Cooking School weekly topics:  Adding Flavor- Sodium-Free  Fast and Healthy Breakfasts  Powerhouse Plant-Based Proteins  Satisfying Salads and Dressings  Simple Sides and Sauces  International Cuisine-Spotlight on the [employer] Zones  Delicious Desserts  Savory Soups  [employer] - Meals in a Astronomer Appetizers and Snacks  Comforting Weekend Breakfasts  One-Pot Wonders   Fast Evening Meals  Landscape architect Your Pritikin Plate  WORKSHOPS   Healthy Mindset (Psychosocial):  Focused Goals, Sustainable Changes Clinical staff led group instruction and group discussion with PowerPoint presentation and patient guidebook. To enhance the learning environment the use of posters, models and videos may be added. Patients will be able to apply effective goal setting strategies to establish at least one personal goal, and then take consistent, meaningful action toward that goal. They will learn to identify common barriers to achieving personal goals and develop strategies to overcome them. Patients will also gain an understanding of how our mind-set can impact our ability to achieve goals and the importance of cultivating a positive and growth-oriented mind-set. The purpose of this lesson is to provide patients with a deeper understanding of how to set and achieve personal goals, as well as the tools and strategies needed to overcome common obstacles which may arise along the way.  From Head to Heart: The Power of a Healthy Outlook  Clinical staff led group instruction and group discussion with PowerPoint presentation and patient guidebook. To  enhance the learning environment the use of posters, models and videos may be added. Patients will be able to recognize and describe the impact of emotions and mood on physical health. They will discover the importance of self-care and explore self-care practices which may work for them. Patients will also learn how to utilize the 4 C's to cultivate a healthier outlook and better manage stress and challenges. The purpose of this lesson is to demonstrate to patients how a healthy outlook is an essential part of maintaining good health, especially as they continue their cardiac rehab journey.  Healthy Sleep for a Healthy Heart Clinical staff led group instruction and group discussion with PowerPoint presentation and patient guidebook. To enhance the learning environment the use of  posters, models and videos may be added. At the conclusion of this workshop, patients will be able to demonstrate knowledge of the importance of sleep to overall health, well-being, and quality of life. They will understand the symptoms of, and treatments for, common sleep disorders. Patients will also be able to identify daytime and nighttime behaviors which impact sleep, and they will be able to apply these tools to help manage sleep-related challenges. The purpose of this lesson is to provide patients with a general overview of sleep and outline the importance of quality sleep. Patients will learn about a few of the most common sleep disorders. Patients will also be introduced to the concept of "sleep hygiene," and discover ways to self-manage certain sleeping problems through simple daily behavior changes. Finally, the workshop will motivate patients by clarifying the links between quality sleep and their goals of heart-healthy living.   Recognizing and Reducing Stress Clinical staff led group instruction and group discussion with PowerPoint presentation and patient guidebook. To enhance the learning environment the use of posters,  models and videos may be added. At the conclusion of this workshop, patients will be able to understand the types of stress reactions, differentiate between acute and chronic stress, and recognize the impact that chronic stress has on their health. They will also be able to apply different coping mechanisms, such as reframing negative self-talk. Patients will have the opportunity to practice a variety of stress management techniques, such as deep abdominal breathing, progressive muscle relaxation, and/or guided imagery.  The purpose of this lesson is to educate patients on the role of stress in their lives and to provide healthy techniques for coping with it.  Learning Barriers/Preferences:   Education Topics:  Knowledge Questionnaire Score:  Knowledge Questionnaire Score - 03/28/24 1004       Knowledge Questionnaire Score   Pre Score 21/24          Core Components/Risk Factors/Patient Goals at Admission:  Personal Goals and Risk Factors at Admission - 03/28/24 1005       Core Components/Risk Factors/Patient Goals on Admission    Weight Management Yes;Weight Maintenance    Intervention Weight Management: Develop a combined nutrition and exercise program designed to reach desired caloric intake, while maintaining appropriate intake of nutrient and fiber, sodium and fats, and appropriate energy expenditure required for the weight goal.;Weight Management: Provide education and appropriate resources to help participant work on and attain dietary goals.    Expected Outcomes Short Term: Continue to assess and modify interventions until short term weight is achieved;Weight Maintenance: Understanding of the daily nutrition guidelines, which includes 25-35% calories from fat, 7% or less cal from saturated fats, less than 200mg  cholesterol, less than 1.5gm of sodium, & 5 or more servings of fruits and vegetables daily;Long Term: Adherence to nutrition and physical activity/exercise program aimed  toward attainment of established weight goal;Understanding recommendations for meals to include 15-35% energy as protein, 25-35% energy from fat, 35-60% energy from carbohydrates, less than 200mg  of dietary cholesterol, 20-35 gm of total fiber daily;Understanding of distribution of calorie intake throughout the day with the consumption of 4-5 meals/snacks    Diabetes Yes    Intervention Provide education about signs/symptoms and action to take for hypo/hyperglycemia.;Provide education about proper nutrition, including hydration, and aerobic/resistive exercise prescription along with prescribed medications to achieve blood glucose in normal ranges: Fasting glucose 65-99 mg/dL    Expected Outcomes Long Term: Attainment of HbA1C < 7%.;Short Term: Participant verbalizes understanding of the signs/symptoms and immediate care  of hyper/hypoglycemia, proper foot care and importance of medication, aerobic/resistive exercise and nutrition plan for blood glucose control.    Heart Failure Yes    Intervention Provide a combined exercise and nutrition program that is supplemented with education, support and counseling about heart failure. Directed toward relieving symptoms such as shortness of breath, decreased exercise tolerance, and extremity edema.    Expected Outcomes Long term: Adoption of self-care skills and reduction of barriers for early signs and symptoms recognition and intervention leading to self-care maintenance.;Short term: Daily weights obtained and reported for increase. Utilizing diuretic protocols set by physician.;Short term: Attendance in program 2-3 days a week with increased exercise capacity. Reported lower sodium intake. Reported increased fruit and vegetable intake. Reports medication compliance.;Improve functional capacity of life    Hypertension Yes    Intervention Provide education on lifestyle modifcations including regular physical activity/exercise, weight management, moderate sodium  restriction and increased consumption of fresh fruit, vegetables, and low fat dairy, alcohol moderation, and smoking cessation.;Monitor prescription use compliance.    Expected Outcomes Short Term: Continued assessment and intervention until BP is < 140/66mm HG in hypertensive participants. < 130/28mm HG in hypertensive participants with diabetes, heart failure or chronic kidney disease.;Long Term: Maintenance of blood pressure at goal levels.    Lipids Yes    Intervention Provide education and support for participant on nutrition & aerobic/resistive exercise along with prescribed medications to achieve LDL 70mg , HDL >40mg .    Expected Outcomes Long Term: Cholesterol controlled with medications as prescribed, with individualized exercise RX and with personalized nutrition plan. Value goals: LDL < 70mg , HDL > 40 mg.;Short Term: Participant states understanding of desired cholesterol values and is compliant with medications prescribed. Participant is following exercise prescription and nutrition guidelines.    Stress Yes    Intervention Refer participants experiencing significant psychosocial distress to appropriate mental health specialists for further evaluation and treatment. When possible, include family members and significant others in education/counseling sessions.;Offer individual and/or small group education and counseling on adjustment to heart disease, stress management and health-related lifestyle change. Teach and support self-help strategies.    Expected Outcomes Long Term: Emotional wellbeing is indicated by absence of clinically significant psychosocial distress or social isolation.;Short Term: Participant demonstrates changes in health-related behavior, relaxation and other stress management skills, ability to obtain effective social support, and compliance with psychotropic medications if prescribed.          Core Components/Risk Factors/Patient Goals Review:    Core Components/Risk  Factors/Patient Goals at Discharge (Final Review):    ITP Comments:  ITP Comments     Row Name 03/28/24 0758           ITP Comments Dr. Wilbert Bihari medical director. Introduction to [facility] education/intensive cardiac rehab. Initial orientation packet reviewed with patient.          Comments: Participant attended orientation for the cardiac rehabilitation program on  03/28/2024  to perform initial intake and exercise walk test. Patient introduced to the [facility] education and orientation packet was reviewed. Completed 6-minute walk test, measurements, initial ITP, and exercise prescription. Vital signs stable. Telemetry-normal sinus rhythm with PVCs, asymptomatic.   Service time was from 0801 to 0933.  Con KATHEE Pereyra, MS, ACSM-CEP 03/28/2024 10:06 AM

## 2024-04-02 ENCOUNTER — Encounter (HOSPITAL_COMMUNITY)
Admission: RE | Admit: 2024-04-02 | Discharge: 2024-04-02 | Disposition: A | Discharge status: Home / Self Care | Source: Ambulatory Visit | Attending: Cardiology | Admitting: Cardiology

## 2024-04-02 DIAGNOSIS — Z955 Presence of coronary angioplasty implant and graft: Secondary | ICD-10-CM | POA: Insufficient documentation

## 2024-04-02 DIAGNOSIS — Z48812 Encounter for surgical aftercare following surgery on the circulatory system: Secondary | ICD-10-CM | POA: Diagnosis not present

## 2024-04-02 LAB — GLUCOSE, CAPILLARY: Glucose-Capillary: 250 mg/dL — ABNORMAL HIGH (ref 70–99)

## 2024-04-02 NOTE — Progress Notes (Signed)
 Daily Session Note  Patient Details  Name: Hayden [name] MRN: [id number] Date of Birth: 12/15/1945 Referring Provider:   Flowsheet Row INTENSIVE CARDIAC REHAB ORIENT from 03/28/2024 in [facility]  Referring Provider Gordy Bergamo, MD    Encounter Date: 04/02/2024  Check In:  Session Check In - 04/02/24 0706       Check-In   Supervising physician immediately available to respond to emergencies [facility] MD immediately available    Physician(s) Josefa Beauvais, NP    Location [facility]    Staff Present Con Pereyra, MS, Exercise Physiologist;Jetta Vannie BS, ACSM-CEP, Exercise Physiologist;Cassell Voorhies Lennon, RN, BSN;Theadora Byes, RN, MHA;Olinty Valere, MS, ACSM-CEP, Exercise Physiologist;Kaylee Nicholaus, MS, ACSM-CEP, Exercise Physiologist    Virtual Visit No    Medication changes reported     No    Fall or balance concerns reported    No    Tobacco Cessation No Change    Warm-up and Cool-down Performed as group-led instruction    Resistance Training Performed Yes    VAD Patient? No    PAD/SET Patient? No      Pain Assessment   Currently in Pain? No/denies    Pain Score 0-No pain    Multiple Pain Sites No          Capillary Blood Glucose: Results for orders placed or performed during the hospital encounter of 04/02/24 (from the past 24 hours)  Glucose, capillary     Status: Abnormal   Collection Time: 04/02/24  7:08 AM  Result Value Ref Range   Glucose-Capillary 250 (H) 70 - 99 mg/dL     Exercise Prescription Changes - 04/02/24 0834       Response to Exercise   Blood Pressure (Admit) 142/70    Blood Pressure (Exercise) 136/82    Blood Pressure (Exit) 138/60    Heart Rate (Admit) 76 bpm    Heart Rate (Exercise) 106 bpm    Heart Rate (Exit) 86 bpm    Rating of Perceived Exertion (Exercise) 2.84    Perceived Dyspnea (Exercise) 0    Symptoms none    Comments Pt first day in the [facility] program.     Duration Progress to 30 minutes of  aerobic without signs/symptoms of physical distress    Intensity THRR unchanged      Progression   Progression Continue to progress workloads to maintain intensity without signs/symptoms of physical distress.    Average METs 3.92      Resistance Training   Training Prescription Yes    Weight 3    Reps 10-15    Time 10 Minutes      Treadmill   MPH 2.4    Grade 0    Minutes 15    METs 2.84      Recumbant Elliptical   Level 1    RPM 65    Watts 103    Minutes 15    METs 5          Social History   Tobacco Use  Smoking Status Never  Smokeless Tobacco Never    Goals Met:  Exercise tolerated well No report of concerns or symptoms today Strength training completed today  Goals Unmet:  Not Applicable  Comments: Pt started cardiac rehab today.  Pt tolerated light exercise without difficulty. VSS, telemetry- trigeminy PVCs at rest and SR with occasional PVCs while exercising, asymptomatic.  Medication list reconciled. Pt denies barriers to medication compliance.  PSYCHOSOCIAL ASSESSMENT:  PHQ-0. Pt exhibits positive coping skills, hopeful outlook with supportive family. No psychosocial needs identified at this time, no psychosocial interventions necessary.    Pt enjoys golf, spending time with his grandkids, fishing, reading and church.   Pt oriented to exercise equipment and routine.    Understanding verbalized.     Dr. Wilbert Bihari is Medical Director for Cardiac Rehab at [facility].

## 2024-04-04 ENCOUNTER — Encounter (HOSPITAL_COMMUNITY)
Admission: RE | Admit: 2024-04-04 | Discharge: 2024-04-04 | Disposition: A | Discharge status: Home / Self Care | Source: Ambulatory Visit | Attending: Cardiology | Admitting: Cardiology

## 2024-04-04 DIAGNOSIS — Z955 Presence of coronary angioplasty implant and graft: Secondary | ICD-10-CM

## 2024-04-04 MED FILL — Evolocumab Subcutaneous Soln Auto-Injector 140 MG/ML: SUBCUTANEOUS | 28 days supply | Qty: 2 | Fill #6 | Status: AC

## 2024-04-06 ENCOUNTER — Encounter (HOSPITAL_COMMUNITY)
Admission: RE | Admit: 2024-04-06 | Discharge: 2024-04-06 | Disposition: A | Discharge status: Home / Self Care | Source: Ambulatory Visit | Attending: Cardiology | Admitting: Cardiology

## 2024-04-06 DIAGNOSIS — Z955 Presence of coronary angioplasty implant and graft: Secondary | ICD-10-CM | POA: Diagnosis not present

## 2024-04-06 NOTE — Progress Notes (Signed)
 Cardiac Individual Treatment Plan  Patient Details  Name: Hayden [name] MRN: [id number] Date of Birth: October 05, 1945 Referring Provider:   Flowsheet Row INTENSIVE CARDIAC REHAB ORIENT from 03/28/2024 in [facility]  Referring Provider Gordy Bergamo, MD    Initial Encounter Date:  Flowsheet Row INTENSIVE CARDIAC REHAB ORIENT from 03/28/2024 in [facility]  Date 03/28/24    Visit Diagnosis: 02/29/24 DES LM & LCx  Patient's Home Medications on Admission:  Current Outpatient Medications:    amLODipine  (NORVASC ) 10 MG tablet, Take 1 tablet (10 mg total) by mouth daily., Disp: 90 tablet, Rfl: 3   ascorbic acid (VITAMIN C) 1000 MG tablet, Take 500 mg by mouth daily., Disp: , Rfl:    aspirin  EC 81 MG tablet, Take 1 tablet (81 mg total) by mouth daily. Swallow whole., Disp: , Rfl:    Bexagliflozin (BRENZAVVY) 20 MG TABS, Take 1 tablet by mouth daily., Disp: , Rfl:    bisoprolol  (ZEBETA ) 5 MG tablet, Take 0.5 tablets (2.5 mg total) by mouth daily., Disp: 45 tablet, Rfl: 1   Cholecalciferol  (VITAMIN D ) 2000 UNITS tablet, Take 2,000 Units by mouth daily., Disp: , Rfl:    clopidogrel  (PLAVIX ) 75 MG tablet, Take 1 tablet (75 mg total) by mouth daily., Disp: 90 tablet, Rfl: 3   Coenzyme Q10 (CO Q 10 PO), Take 1 tablet by mouth daily., Disp: , Rfl:    Evolocumab  (REPATHA  SURECLICK) 140 MG/ML SOAJ, Inject 140 mg into the skin every 14 (fourteen) days., Disp: 2 mL, Rfl: 11   hyoscyamine  (LEVSIN ) 0.125 MG tablet, Take 1 tablet (0.125 mg total) by mouth every 4 (four) hours as needed., Disp: 30 tablet, Rfl: 1   levothyroxine  (SYNTHROID ) 75 MCG tablet, Take 1 tablet (75 mcg total) by mouth every morning on an empty stomach., Disp: 90 tablet, Rfl: 3   losartan  (COZAAR ) 25 MG tablet, Take 1 tablet (25 mg total) by mouth daily., Disp: 90 tablet, Rfl: 4   metFORMIN  (GLUCOPHAGE -XR) 500 MG 24 hr tablet,  Take 500 mg by mouth 3 (three) times daily., Disp: , Rfl:    metFORMIN  (GLUCOPHAGE -XR) 500 MG 24 hr tablet, Take 3 tablets (1,500 mg total) by mouth every evening with a meal., Disp: 270 tablet, Rfl: 4   Multiple Vitamin (MULTIVITAMIN WITH MINERALS) TABS, Take 1 tablet by mouth daily., Disp: , Rfl:    nitroGLYCERIN  (NITROSTAT ) 0.4 MG SL tablet, Place 1 tablet (0.4 mg total) under the tongue every 5 (five) minutes as needed., Disp: 25 tablet, Rfl: 2   pantoprazole  (PROTONIX ) 40 MG tablet, Take 1 tablet (40 mg total) by mouth daily., Disp: 90 tablet, Rfl: 3   predniSONE  (STERAPRED UNI-PAK 21 TAB) 5 MG (21) TBPK tablet, Take as directed (Patient not taking: Reported on 03/28/2024), Disp: 21 each, Rfl: 1  Past Medical History: Past Medical History:  Diagnosis Date   Anxiety disorder    No details   Aortic root dilatation (HCC)    borderline at 38mm by echo 08/2023   Arthritis    Bright's disease age 44   Carotid artery stenosis    1-39% stenosis bilateraly by dopplers 2020   Chronic combined systolic (congestive) and diastolic (congestive) heart failure (HCC)    EF 40-45% by evho 08/2023   Coronary artery disease    MI in 1994 with PTCA of left circ and then coronary bypass 2000   Frequent urination  H/O hiatal hernia    Hyperlipidemia    statin intolerant   Hypertension    Hypogonadism male    No details available but receives replacement therapy   Impaired hearing    both ears   Kidney stones    100  in past plus stones   Myocardial infarction (HCC) 10/31/1989   Opiate addiction (HCC)    PONV (postoperative nausea and vomiting)    pt prefers epidural is possible   Pulmonary regurgitation    moderate by echo 10/2019    Tobacco Use: Social History   Tobacco Use  Smoking Status Never  Smokeless Tobacco Never    Labs: Review Flowsheet  More data exists      Latest Ref Rng & Units 07/13/2016 04/13/2021 06/24/2021 02/02/2023 05/18/2023  Labs for ITP Cardiac and Pulmonary Rehab   Cholestrol 100 - 199 mg/dL 804  777  66  79  87   LDL (calc) 0 - 99 mg/dL 861  832  17  18  24    HDL-C >39 mg/dL 27  26  27  31   35   Trlycerides 0 - 149 mg/dL 851  843  880  811  832     Capillary Blood Glucose: Lab Results  Component Value Date   GLUCAP 250 (H) 04/02/2024   GLUCAP 129 (H) 02/29/2024   GLUCAP 174 (H) 02/29/2024   GLUCAP 169 (H) 08/17/2023     Exercise Target Goals: Exercise Program Goal: Individual exercise prescription set using results from initial 6 min walk test and THRR while considering  patient's activity barriers and safety.   Exercise Prescription Goal: Initial exercise prescription builds to 30-45 minutes a day of aerobic activity, 2-3 days per week.  Home exercise guidelines will be given to patient during program as part of exercise prescription that the participant will acknowledge.  Activity Barriers & Risk Stratification:  Activity Barriers & Cardiac Risk Stratification - 03/28/24 1001       Activity Barriers & Cardiac Risk Stratification   Activity Barriers Arthritis;Joint Problems;Right Knee Replacement;Decreased Ventricular Function    Cardiac Risk Stratification High   <5 METs on         6 Minute Walk:  6 Minute Walk     Row Name 03/28/24 1000         6 Minute Walk   Phase Initial     Distance 1250 feet     Walk Time 6 minutes     # of Rest Breaks 0     MPH 2.37     METS 2.71     RPE 10     Perceived Dyspnea  0     VO2 Peak 9.47     Symptoms No     Resting HR 87 bpm     Resting BP 130/62     Resting Oxygen Saturation  98 %     Exercise Oxygen Saturation  during 6 min walk 98 %     Max Ex. HR 105 bpm     Max Ex. BP 150/62     2 Minute Post BP 124/66        Oxygen Initial Assessment:   Oxygen Re-Evaluation:   Oxygen Discharge (Final Oxygen Re-Evaluation):   Initial Exercise Prescription:  Initial Exercise Prescription - 03/28/24 1000       Date of Initial Exercise RX and Referring Provider   Date  03/28/24    Referring Provider Gordy Bergamo, MD    Expected Discharge Date 06/20/24  Treadmill   MPH 2    Grade 0    Minutes 15    METs 2.5      Recumbant Elliptical   Level 1    RPM 50    Watts 70    Minutes 15    METs 2      Prescription Details   Frequency (times per week) 3    Duration Progress to 30 minutes of continuous aerobic without signs/symptoms of physical distress      Intensity   THRR 40-80% of Max Heartrate 57-114    Ratings of Perceived Exertion 11-13    Perceived Dyspnea 0-4      Progression   Progression Continue progressive overload as per policy without signs/symptoms or physical distress.      Resistance Training   Training Prescription Yes    Weight 3    Reps 10-15          Perform Capillary Blood Glucose checks as needed.  Exercise Prescription Changes:   Exercise Prescription Changes     Row Name 04/02/24 [phone]             Response to Exercise   Blood Pressure (Admit) 142/70       Blood Pressure (Exercise) 136/82       Blood Pressure (Exit) 138/60       Heart Rate (Admit) 76 bpm       Heart Rate (Exercise) 106 bpm       Heart Rate (Exit) 86 bpm       Rating of Perceived Exertion (Exercise) 2.84       Perceived Dyspnea (Exercise) 0       Symptoms none       Comments Pt first day in the [facility] ICR program.       Duration Progress to 30 minutes of  aerobic without signs/symptoms of physical distress       Intensity THRR unchanged         Progression   Progression Continue to progress workloads to maintain intensity without signs/symptoms of physical distress.       Average METs 3.92         Resistance Training   Training Prescription Yes       Weight 3       Reps 10-15       Time 10 Minutes         Treadmill   MPH 2.4       Grade 0       Minutes 15       METs 2.84         Recumbant Elliptical   Level 1       RPM 65       Watts 103       Minutes 15       METs 5          Exercise Comments:   Exercise  Comments     Row Name 04/02/24 0840           Exercise Comments Pt first day in the [facility] ICR program. Pt tolerated exercise well with an average MET level of 3.92. Pt is off to a good start and is learning his THRR, RPE and ExRx          Exercise Goals and Review:   Exercise Goals     Row Name 03/28/24 1002             Exercise Goals   Increase Physical  Activity Yes       Intervention Develop an individualized exercise prescription for aerobic and resistive training based on initial evaluation findings, risk stratification, comorbidities and participant's personal goals.;Provide advice, education, support and counseling about physical activity/exercise needs.       Expected Outcomes Short Term: Attend rehab on a regular basis to increase amount of physical activity.;Long Term: Exercising regularly at least 3-5 days a week.;Long Term: Add in home exercise to make exercise part of routine and to increase amount of physical activity.       Increase Strength and Stamina Yes       Intervention Provide advice, education, support and counseling about physical activity/exercise needs.;Develop an individualized exercise prescription for aerobic and resistive training based on initial evaluation findings, risk stratification, comorbidities and participant's personal goals.       Expected Outcomes Short Term: Perform resistance training exercises routinely during rehab and add in resistance training at home;Short Term: Increase workloads from initial exercise prescription for resistance, speed, and METs.;Long Term: Improve cardiorespiratory fitness, muscular endurance and strength as measured by increased METs and functional capacity ( )       Able to understand and use rate of perceived exertion (RPE) scale Yes       Intervention Provide education and explanation on how to use RPE scale       Expected Outcomes Short Term: Able to use RPE daily in rehab to express subjective intensity  level;Long Term:  Able to use RPE to guide intensity level when exercising independently       Knowledge and understanding of Target Heart Rate Range (THRR) Yes       Intervention Provide education and explanation of THRR including how the numbers were predicted and where they are located for reference       Expected Outcomes Long Term: Able to use THRR to govern intensity when exercising independently;Short Term: Able to state/look up THRR;Short Term: Able to use daily as guideline for intensity in rehab       Understanding of Exercise Prescription Yes       Intervention Provide education, explanation, and written materials on patient's individual exercise prescription       Expected Outcomes Short Term: Able to explain program exercise prescription;Long Term: Able to explain home exercise prescription to exercise independently          Exercise Goals Re-Evaluation :  Exercise Goals Re-Evaluation     Row Name 04/02/24 [phone]             Exercise Goal Re-Evaluation   Exercise Goals Review Increase Physical Activity;Understanding of Exercise Prescription;Increase Strength and Stamina;Knowledge and understanding of Target Heart Rate Range (THRR);Able to understand and use rate of perceived exertion (RPE) scale       Comments Pt first day in the [facility] ICR program. Pt tolerated exercise well with an average MET level of 3.92. Pt is off to a good start and is learning his THRR, RPE and ExRx       Expected Outcomes Will contniue to monitor patient and progress workloads as tolerated without sign or symptom          Discharge Exercise Prescription (Final Exercise Prescription Changes):  Exercise Prescription Changes - 04/02/24 0834       Response to Exercise   Blood Pressure (Admit) 142/70    Blood Pressure (Exercise) 136/82    Blood Pressure (Exit) 138/60    Heart Rate (Admit) 76 bpm    Heart Rate (Exercise) 106 bpm  Heart Rate (Exit) 86 bpm    Rating of Perceived Exertion  (Exercise) 2.84    Perceived Dyspnea (Exercise) 0    Symptoms none    Comments Pt first day in the [facility] ICR program.    Duration Progress to 30 minutes of  aerobic without signs/symptoms of physical distress    Intensity THRR unchanged      Progression   Progression Continue to progress workloads to maintain intensity without signs/symptoms of physical distress.    Average METs 3.92      Resistance Training   Training Prescription Yes    Weight 3    Reps 10-15    Time 10 Minutes      Treadmill   MPH 2.4    Grade 0    Minutes 15    METs 2.84      Recumbant Elliptical   Level 1    RPM 65    Watts 103    Minutes 15    METs 5          Nutrition:  Target Goals: Understanding of nutrition guidelines, daily intake of sodium 1500mg , cholesterol 200mg , calories 30% from fat and 7% or less from saturated fats, daily to have 5 or more servings of fruits and vegetables.  Biometrics:  Pre Biometrics - 03/28/24 0852       Pre Biometrics   Waist Circumference 38.5 inches    Hip Circumference 39 inches    Waist to Hip Ratio 0.99 %    Triceps Skinfold 15 mm    % Body Fat 26.4 %    Grip Strength 26 kg    Flexibility 12.25 in    Single Leg Stand 30 seconds           Nutrition Therapy Plan and Nutrition Goals:  Nutrition Therapy & Goals - 04/02/24 1037       Nutrition Therapy   Diet Heart Healthy Diet      Personal Nutrition Goals   Nutrition Goal Patient to identify strategies for reducing cardiovascular risk by attending the [facility] education and nutrition series weekly.    Personal Goal #2 Patient to improve diet quality by using the plate method as a guide for meal planning to include lean protein/plant protein, fruits, vegetables, whole grains, nonfat dairy as part of a well-balanced diet.    Comments Patient has medical history of s/p coronary artery stent placement, CABGx3, HTN, hyperlipidemia, CHF, CABGx3. LDL is well controlled (on repatha ). A1c is not at  goal (metformin ). At 03/14/24 follow-up, metamucil and flaxseed was recommended for history of elevated triglycerides. Patient will benefit from participation in intensive cardiac rehab for nutrition education, exercise, and lifestyle modification.      Intervention Plan   Intervention Prescribe, educate and counsel regarding individualized specific dietary modifications aiming towards targeted core components such as weight, hypertension, lipid management, diabetes, heart failure and other comorbidities.;Nutrition handout(s) given to patient.    Expected Outcomes Short Term Goal: Understand basic principles of dietary content, such as calories, fat, sodium, cholesterol and nutrients.;Long Term Goal: Adherence to prescribed nutrition plan.          Nutrition Assessments:  Nutrition Assessments - 04/02/24 1110       Rate Your Plate Scores   Pre Score 78         MEDIFICTS Score Key: >=70 Need to make dietary changes  40-70 Heart Healthy Diet <= 40 Therapeutic Level Cholesterol Diet   Flowsheet Row INTENSIVE CARDIAC REHAB from 04/02/2024 in [place]  [facility]  Picture Your Plate Total Score on Admission 78   Picture Your Plate Scores: <59 Unhealthy dietary pattern with much room for improvement. 41-50 Dietary pattern unlikely to meet recommendations for good health and room for improvement. 51-60 More healthful dietary pattern, with some room for improvement.  >60 Healthy dietary pattern, although there may be some specific behaviors that could be improved.    Nutrition Goals Re-Evaluation:  Nutrition Goals Re-Evaluation     Row Name 04/02/24 1037             Goals   Current Weight 166 lb 7.2 oz (75.5 kg)       Comment A1c 8.4, triglycerides 167, LDL 24       Expected Outcome Patient has medical history of s/p coronary artery stent placement, CABGx3, HTN, hyperlipidemia, CHF, CABGx3. LDL is well controlled (on repatha ). A1c  is not at goal (metformin ). At 03/14/24 follow-up, metamucil and flaxseed was recommended for history of elevated triglycerides. Patient will benefit from participation in intensive cardiac rehab for nutrition education, exercise, and lifestyle modification.          Nutrition Goals Re-Evaluation:  Nutrition Goals Re-Evaluation     Row Name 04/02/24 1037             Goals   Current Weight 166 lb 7.2 oz (75.5 kg)       Comment A1c 8.4, triglycerides 167, LDL 24       Expected Outcome Patient has medical history of s/p coronary artery stent placement, CABGx3, HTN, hyperlipidemia, CHF, CABGx3. LDL is well controlled (on repatha ). A1c is not at goal (metformin ). At 03/14/24 follow-up, metamucil and flaxseed was recommended for history of elevated triglycerides. Patient will benefit from participation in intensive cardiac rehab for nutrition education, exercise, and lifestyle modification.          Nutrition Goals Discharge (Final Nutrition Goals Re-Evaluation):  Nutrition Goals Re-Evaluation - 04/02/24 1037       Goals   Current Weight 166 lb 7.2 oz (75.5 kg)    Comment A1c 8.4, triglycerides 167, LDL 24    Expected Outcome Patient has medical history of s/p coronary artery stent placement, CABGx3, HTN, hyperlipidemia, CHF, CABGx3. LDL is well controlled (on repatha ). A1c is not at goal (metformin ). At 03/14/24 follow-up, metamucil and flaxseed was recommended for history of elevated triglycerides. Patient will benefit from participation in intensive cardiac rehab for nutrition education, exercise, and lifestyle modification.          Psychosocial: Target Goals: Acknowledge presence or absence of significant depression and/or stress, maximize coping skills, provide positive support system. Participant is able to verbalize types and ability to use techniques and skills needed for reducing stress and depression.  Initial Review & Psychosocial Screening:  Initial Psych Review & Screening -  03/28/24 0857       Initial Review   Current issues with Current Stress Concerns    Source of Stress Concerns Family    Comments Hayden [name] shared that he has some stress regarding his children and grandchildren, specifically his granddaughter. He stated that he worries about them more than anything else, but not to a degree that's overwhelming. He denies any depression/anxiety or need for additional resources.      Family Dynamics   Good Support System? Yes   wife     Barriers   Psychosocial barriers to participate in program The patient should benefit from training in stress management and relaxation.  Screening Interventions   Interventions Provide feedback about the scores to participant;To provide support and resources with identified psychosocial needs;Encouraged to exercise    Expected Outcomes Short Term goal: Identification and review with participant of any Quality of Life or Depression concerns found by scoring the questionnaire.;Long Term goal: The participant improves quality of Life and PHQ9 Scores as seen by post scores and/or verbalization of changes;Long Term Goal: Stressors or current issues are controlled or eliminated.          Quality of Life Scores:  Quality of Life - 03/28/24 1003       Quality of Life   Select Quality of Life      Quality of Life Scores   Health/Function Pre 27.5 %    Socioeconomic Pre 29.06 %    Psych/Spiritual Pre 30 %    Family Pre 28.8 %    GLOBAL Pre 28.54 %         Scores of 19 and below usually indicate a poorer quality of life in these areas.  A difference of  2-3 points is a clinically meaningful difference.  A difference of 2-3 points in the total score of the Quality of Life Index has been associated with significant improvement in overall quality of life, self-image, physical symptoms, and general health in studies assessing change in quality of life.  PHQ-9: Review Flowsheet       03/28/2024  Depression screen PHQ 2/9   Decreased Interest 0  Down, Depressed, Hopeless 0  PHQ - 2 Score 0  Altered sleeping 0  Tired, decreased energy 0  Change in appetite 0  Feeling bad or failure about yourself  0  Trouble concentrating 0  Moving slowly or fidgety/restless 0  Suicidal thoughts 0  PHQ-9 Score 0   Interpretation of Total Score  Total Score Depression Severity:  1-4 = Minimal depression, 5-9 = Mild depression, 10-14 = Moderate depression, 15-19 = Moderately severe depression, 20-27 = Severe depression   Psychosocial Evaluation and Intervention:   Psychosocial Re-Evaluation:  Psychosocial Re-Evaluation     Row Name 04/06/24 0910             Psychosocial Re-Evaluation   Current issues with Current Stress Concerns       Comments Family       Interventions Encouraged to attend Cardiac Rehabilitation for the exercise;Stress management education;Encouraged to attend Pulmonary Rehabilitation for the exercise         Initial Review   Source of Stress Concerns Family       Comments Will continue to monitor and offer support as needed          Psychosocial Discharge (Final Psychosocial Re-Evaluation):  Psychosocial Re-Evaluation - 04/06/24 0910       Psychosocial Re-Evaluation   Current issues with Current Stress Concerns    Comments Family    Interventions Encouraged to attend Cardiac Rehabilitation for the exercise;Stress management education;Encouraged to attend Pulmonary Rehabilitation for the exercise      Initial Review   Source of Stress Concerns Family    Comments Will continue to monitor and offer support as needed          Vocational Rehabilitation: Provide vocational rehab assistance to qualifying candidates.   Vocational Rehab Evaluation & Intervention:  Vocational Rehab - 03/28/24 1005       Initial Vocational Rehab Evaluation & Intervention   Assessment shows need for Vocational Rehabilitation No   works part time  Education: Education Goals: Education  classes will be provided on a weekly basis, covering required topics. Participant will state understanding/return demonstration of topics presented.    Education     Row Name 04/04/24 0800     Education   Cardiac Education Topics [facility]   Select Core Videos     Core Videos   Educator Exercise Physiologist   Select Nutrition   Nutrition Other  Label Reading   Instruction Review Code 1- Verbalizes Understanding   Class Start Time 0815   Class Stop Time 0847   Class Time Calculation (min) 32 min    Row Name 04/06/24 0800     Education   Cardiac Education Topics [facility]   Orthoptist   Educator Dietitian   Weekly Topic Fast and Healthy Breakfasts   Instruction Review Code 1- Verbalizes Understanding   Class Start Time 0815   Class Stop Time 0847   Class Time Calculation (min) 32 min      Core Videos: Exercise    Move It!  Clinical staff conducted group or individual video education with verbal and written material and guidebook.  Patient learns the recommended [facility] exercise program. Exercise with the goal of living a long, healthy life. Some of the health benefits of exercise include controlled diabetes, healthier blood pressure levels, improved cholesterol levels, improved heart and lung capacity, improved sleep, and better body composition. Everyone should speak with their doctor before starting or changing an exercise routine.  Biomechanical Limitations Clinical staff conducted group or individual video education with verbal and written material and guidebook.  Patient learns how biomechanical limitations can impact exercise and how we can mitigate and possibly overcome limitations to have an impactful and balanced exercise routine.  Body Composition Clinical staff conducted group or individual video education with verbal and written material and guidebook.  Patient learns that body composition (ratio of muscle mass to fat mass) is a  key component to assessing overall fitness, rather than body weight alone. Increased fat mass, especially visceral belly fat, can put us  at increased risk for metabolic syndrome, type 2 diabetes, heart disease, and even death. It is recommended to combine diet and exercise (cardiovascular and resistance training) to improve your body composition. Seek guidance from your physician and exercise physiologist before implementing an exercise routine.  Exercise Action Plan Clinical staff conducted group or individual video education with verbal and written material and guidebook.  Patient learns the recommended strategies to achieve and enjoy long-term exercise adherence, including variety, self-motivation, self-efficacy, and positive decision making. Benefits of exercise include fitness, good health, weight management, more energy, better sleep, less stress, and overall well-being.  Medical   Heart Disease Risk Reduction Clinical staff conducted group or individual video education with verbal and written material and guidebook.  Patient learns our heart is our most vital organ as it circulates oxygen, nutrients, white blood cells, and hormones throughout the entire body, and carries waste away. Data supports a plant-based eating plan like the Pritikin Program for its effectiveness in slowing progression of and reversing heart disease. The video provides a number of recommendations to address heart disease.   Metabolic Syndrome and Belly Fat  Clinical staff conducted group or individual video education with verbal and written material and guidebook.  Patient learns what metabolic syndrome is, how it leads to heart disease, and how one can reverse it and keep it from coming back. You have metabolic syndrome if you have 3 of  the following 5 criteria: abdominal obesity, high blood pressure, high triglycerides, low HDL cholesterol, and high blood sugar.  Hypertension and Heart Disease Clinical staff conducted  group or individual video education with verbal and written material and guidebook.  Patient learns that high blood pressure, or hypertension, is very common in the [place] . Hypertension is largely due to excessive salt intake, but other important risk factors include being overweight, physical inactivity, drinking too much alcohol, smoking, and not eating enough potassium from fruits and vegetables. High blood pressure is a leading risk factor for heart attack, stroke, congestive heart failure, dementia, kidney failure, and premature death. Long-term effects of excessive salt intake include stiffening of the arteries and thickening of heart muscle and organ damage. Recommendations include ways to reduce hypertension and the risk of heart disease.  Diseases of Our Time - Focusing on Diabetes Clinical staff conducted group or individual video education with verbal and written material and guidebook.  Patient learns why the best way to stop diseases of our time is prevention, through food and other lifestyle changes. Medicine (such as prescription pills and surgeries) is often only a Band-Aid on the problem, not a long-term solution. Most common diseases of our time include obesity, type 2 diabetes, hypertension, heart disease, and cancer. The Pritikin Program is recommended and has been proven to help reduce, reverse, and/or prevent the damaging effects of metabolic syndrome.  Nutrition   Overview of the Pritikin Eating Plan  Clinical staff conducted group or individual video education with verbal and written material and guidebook.  Patient learns about the Pritikin Eating Plan for disease risk reduction. The Pritikin Eating Plan emphasizes a wide variety of unrefined, minimally-processed carbohydrates, like fruits, vegetables, whole grains, and legumes. Go, Caution, and Stop food choices are explained. Plant-based and lean animal proteins are emphasized. Rationale provided for low sodium intake for  blood pressure control, low added sugars for blood sugar stabilization, and low added fats and oils for coronary artery disease risk reduction and weight management.  Calorie Density  Clinical staff conducted group or individual video education with verbal and written material and guidebook.  Patient learns about calorie density and how it impacts the Pritikin Eating Plan. Knowing the characteristics of the food you choose will help you decide whether those foods will lead to weight gain or weight loss, and whether you want to consume more or less of them. Weight loss is usually a side effect of the Pritikin Eating Plan because of its focus on low calorie-dense foods.  Label Reading  Clinical staff conducted group or individual video education with verbal and written material and guidebook.  Patient learns about the Pritikin recommended label reading guidelines and corresponding recommendations regarding calorie density, added sugars, sodium content, and whole grains.  Dining Out - Part 1  Clinical staff conducted group or individual video education with verbal and written material and guidebook.  Patient learns that restaurant meals can be sabotaging because they can be so high in calories, fat, sodium, and/or sugar. Patient learns recommended strategies on how to positively address this and avoid unhealthy pitfalls.  Facts on Fats  Clinical staff conducted group or individual video education with verbal and written material and guidebook.  Patient learns that lifestyle modifications can be just as effective, if not more so, as many medications for lowering your risk of heart disease. A Pritikin lifestyle can help to reduce your risk of inflammation and atherosclerosis (cholesterol build-up, or plaque, in the artery walls). Lifestyle interventions such  as dietary choices and physical activity address the cause of atherosclerosis. A review of the types of fats and their impact on blood cholesterol  levels, along with dietary recommendations to reduce fat intake is also included.  Nutrition Action Plan  Clinical staff conducted group or individual video education with verbal and written material and guidebook.  Patient learns how to incorporate Pritikin recommendations into their lifestyle. Recommendations include planning and keeping personal health goals in mind as an important part of their success.  Healthy Mind-Set    Healthy Minds, Bodies, Hearts  Clinical staff conducted group or individual video education with verbal and written material and guidebook.  Patient learns how to identify when they are stressed. Video will discuss the impact of that stress, as well as the many benefits of stress management. Patient will also be introduced to stress management techniques. The way we think, act, and feel has an impact on our hearts.  How Our Thoughts Can Heal Our Hearts  Clinical staff conducted group or individual video education with verbal and written material and guidebook.  Patient learns that negative thoughts can cause depression and anxiety. This can result in negative lifestyle behavior and serious health problems. Cognitive behavioral therapy is an effective method to help control our thoughts in order to change and improve our emotional outlook.  Additional Videos:  Exercise    Improving Performance  Clinical staff conducted group or individual video education with verbal and written material and guidebook.  Patient learns to use a non-linear approach by alternating intensity levels and lengths of time spent exercising to help burn more calories and lose more body fat. Cardiovascular exercise helps improve heart health, metabolism, hormonal balance, blood sugar control, and recovery from fatigue. Resistance training improves strength, endurance, balance, coordination, reaction time, metabolism, and muscle mass. Flexibility exercise improves circulation, posture, and balance. Seek  guidance from your physician and exercise physiologist before implementing an exercise routine and learn your capabilities and proper form for all exercise.  Introduction to Yoga  Clinical staff conducted group or individual video education with verbal and written material and guidebook.  Patient learns about yoga, a discipline of the coming together of mind, breath, and body. The benefits of yoga include improved flexibility, improved range of motion, better posture and core strength, increased lung function, weight loss, and positive self-image. Yoga's heart health benefits include lowered blood pressure, healthier heart rate, decreased cholesterol and triglyceride levels, improved immune function, and reduced stress. Seek guidance from your physician and exercise physiologist before implementing an exercise routine and learn your capabilities and proper form for all exercise.  Medical   Aging: Enhancing Your Quality of Life  Clinical staff conducted group or individual video education with verbal and written material and guidebook.  Patient learns key strategies and recommendations to stay in good physical health and enhance quality of life, such as prevention strategies, having an advocate, securing a Health Care Proxy and Power of Attorney, and keeping a list of medications and system for tracking them. It also discusses how to avoid risk for bone loss.  Biology of Weight Control  Clinical staff conducted group or individual video education with verbal and written material and guidebook.  Patient learns that weight gain occurs because we consume more calories than we burn (eating more, moving less). Even if your body weight is normal, you may have higher ratios of fat compared to muscle mass. Too much body fat puts you at increased risk for cardiovascular disease, heart attack, stroke,  type 2 diabetes, and obesity-related cancers. In addition to exercise, following the Pritikin Eating Plan can help  reduce your risk.  Decoding Lab Results  Clinical staff conducted group or individual video education with verbal and written material and guidebook.  Patient learns that lab test reflects one measurement whose values change over time and are influenced by many factors, including medication, stress, sleep, exercise, food, hydration, pre-existing medical conditions, and more. It is recommended to use the knowledge from this video to become more involved with your lab results and evaluate your numbers to speak with your doctor.   Diseases of Our Time - Overview  Clinical staff conducted group or individual video education with verbal and written material and guidebook.  Patient learns that according to the CDC, 50% to 70% of chronic diseases (such as obesity, type 2 diabetes, elevated lipids, hypertension, and heart disease) are avoidable through lifestyle improvements including healthier food choices, listening to satiety cues, and increased physical activity.  Sleep Disorders Clinical staff conducted group or individual video education with verbal and written material and guidebook.  Patient learns how good quality and duration of sleep are important to overall health and well-being. Patient also learns about sleep disorders and how they impact health along with recommendations to address them, including discussing with a physician.  Nutrition  Dining Out - Part 2 Clinical staff conducted group or individual video education with verbal and written material and guidebook.  Patient learns how to plan ahead and communicate in order to maximize their dining experience in a healthy and nutritious manner. Included are recommended food choices based on the type of restaurant the patient is visiting.   Fueling a Banker conducted group or individual video education with verbal and written material and guidebook.  There is a strong connection between our food choices and our health.  Diseases like obesity and type 2 diabetes are very prevalent and are in large-part due to lifestyle choices. The Pritikin Eating Plan provides plenty of food and hunger-curbing satisfaction. It is easy to follow, affordable, and helps reduce health risks.  Menu Workshop  Clinical staff conducted group or individual video education with verbal and written material and guidebook.  Patient learns that restaurant meals can sabotage health goals because they are often packed with calories, fat, sodium, and sugar. Recommendations include strategies to plan ahead and to communicate with the manager, chef, or server to help order a healthier meal.  Planning Your Eating Strategy  Clinical staff conducted group or individual video education with verbal and written material and guidebook.  Patient learns about the Pritikin Eating Plan and its benefit of reducing the risk of disease. The Pritikin Eating Plan does not focus on calories. Instead, it emphasizes high-quality, nutrient-rich foods. By knowing the characteristics of the foods, we choose, we can determine their calorie density and make informed decisions.  Targeting Your Nutrition Priorities  Clinical staff conducted group or individual video education with verbal and written material and guidebook.  Patient learns that lifestyle habits have a tremendous impact on disease risk and progression. This video provides eating and physical activity recommendations based on your personal health goals, such as reducing LDL cholesterol, losing weight, preventing or controlling type 2 diabetes, and reducing high blood pressure.  Vitamins and Minerals  Clinical staff conducted group or individual video education with verbal and written material and guidebook.  Patient learns different ways to obtain key vitamins and minerals, including through a recommended healthy diet. It is  important to discuss all supplements you take with your doctor.   Healthy Mind-Set     Smoking Cessation  Clinical staff conducted group or individual video education with verbal and written material and guidebook.  Patient learns that cigarette smoking and tobacco addiction pose a serious health risk which affects millions of people. Stopping smoking will significantly reduce the risk of heart disease, lung disease, and many forms of cancer. Recommended strategies for quitting are covered, including working with your doctor to develop a successful plan.  Culinary   Becoming a Set designer conducted group or individual video education with verbal and written material and guidebook.  Patient learns that cooking at home can be healthy, cost-effective, quick, and puts them in control. Keys to cooking healthy recipes will include looking at your recipe, assessing your equipment needs, planning ahead, making it simple, choosing cost-effective seasonal ingredients, and limiting the use of added fats, salts, and sugars.  Cooking - Breakfast and Snacks  Clinical staff conducted group or individual video education with verbal and written material and guidebook.  Patient learns how important breakfast is to satiety and nutrition through the entire day. Recommendations include key foods to eat during breakfast to help stabilize blood sugar levels and to prevent overeating at meals later in the day. Planning ahead is also a key component.  Cooking - Educational psychologist conducted group or individual video education with verbal and written material and guidebook.  Patient learns eating strategies to improve overall health, including an approach to cook more at home. Recommendations include thinking of animal protein as a side on your plate rather than center stage and focusing instead on lower calorie dense options like vegetables, fruits, whole grains, and plant-based proteins, such as beans. Making sauces in large quantities to freeze for later and leaving the skin  on your vegetables are also recommended to maximize your experience.  Cooking - Healthy Salads and Dressing Clinical staff conducted group or individual video education with verbal and written material and guidebook.  Patient learns that vegetables, fruits, whole grains, and legumes are the foundations of the Pritikin Eating Plan. Recommendations include how to incorporate each of these in flavorful and healthy salads, and how to create homemade salad dressings. Proper handling of ingredients is also covered. Cooking - Soups and [employer] - Soups and Desserts Clinical staff conducted group or individual video education with verbal and written material and guidebook.  Patient learns that Pritikin soups and desserts make for easy, nutritious, and delicious snacks and meal components that are low in sodium, fat, sugar, and calorie density, while high in vitamins, minerals, and filling fiber. Recommendations include simple and healthy ideas for soups and desserts.   Overview     The Pritikin Solution Program Overview Clinical staff conducted group or individual video education with verbal and written material and guidebook.  Patient learns that the results of the Pritikin Program have been documented in more than 100 articles published in peer-reviewed journals, and the benefits include reducing risk factors for (and, in some cases, even reversing) high cholesterol, high blood pressure, type 2 diabetes, obesity, and more! An overview of the three key pillars of the Pritikin Program will be covered: eating well, doing regular exercise, and having a healthy mind-set.  WORKSHOPS  Exercise: Exercise Basics: Building Your Action Plan Clinical staff led group instruction and group discussion with PowerPoint presentation and patient guidebook. To enhance the learning environment the use of posters, models  and videos may be added. At the conclusion of this workshop, patients will comprehend the  difference between physical activity and exercise, as well as the benefits of incorporating both, into their routine. Patients will understand the FITT (Frequency, Intensity, Time, and Type) principle and how to use it to build an exercise action plan. In addition, safety concerns and other considerations for exercise and cardiac rehab will be addressed by the presenter. The purpose of this lesson is to promote a comprehensive and effective weekly exercise routine in order to improve patients' overall level of fitness.   Managing Heart Disease: Your Path to a Healthier Heart Clinical staff led group instruction and group discussion with PowerPoint presentation and patient guidebook. To enhance the learning environment the use of posters, models and videos may be added.At the conclusion of this workshop, patients will understand the anatomy and physiology of the heart. Additionally, they will understand how Pritikin's three pillars impact the risk factors, the progression, and the management of heart disease.  The purpose of this lesson is to provide a high-level overview of the heart, heart disease, and how the Pritikin lifestyle positively impacts risk factors.  Exercise Biomechanics Clinical staff led group instruction and group discussion with PowerPoint presentation and patient guidebook. To enhance the learning environment the use of posters, models and videos may be added. Patients will learn how the structural parts of their bodies function and how these functions impact their daily activities, movement, and exercise. Patients will learn how to promote a neutral spine, learn how to manage pain, and identify ways to improve their physical movement in order to promote healthy living. The purpose of this lesson is to expose patients to common physical limitations that impact physical activity. Participants will learn practical ways to adapt and manage aches and pains, and to minimize their  effect on regular exercise. Patients will learn how to maintain good posture while sitting, walking, and lifting.  Balance Training and Fall Prevention  Clinical staff led group instruction and group discussion with PowerPoint presentation and patient guidebook. To enhance the learning environment the use of posters, models and videos may be added. At the conclusion of this workshop, patients will understand the importance of their sensorimotor skills (vision, proprioception, and the vestibular system) in maintaining their ability to balance as they age. Patients will apply a variety of balancing exercises that are appropriate for their current level of function. Patients will understand the common causes for poor balance, possible solutions to these problems, and ways to modify their physical environment in order to minimize their fall risk. The purpose of this lesson is to teach patients about the importance of maintaining balance as they age and ways to minimize their risk of falling.  WORKSHOPS   Nutrition:  Fueling a Ship broker led group instruction and group discussion with PowerPoint presentation and patient guidebook. To enhance the learning environment the use of posters, models and videos may be added. Patients will review the foundational principles of the Pritikin Eating Plan and understand what constitutes a serving size in each of the food groups. Patients will also learn Pritikin-friendly foods that are better choices when away from home and review make-ahead meal and snack options. Calorie density will be reviewed and applied to three nutrition priorities: weight maintenance, weight loss, and weight gain. The purpose of this lesson is to reinforce (in a group setting) the key concepts around what patients are recommended to eat and how to apply these guidelines when away  from home by planning and selecting Pritikin-friendly options. Patients will understand how calorie  density may be adjusted for different weight management goals.  Mindful Eating  Clinical staff led group instruction and group discussion with PowerPoint presentation and patient guidebook. To enhance the learning environment the use of posters, models and videos may be added. Patients will briefly review the concepts of the Pritikin Eating Plan and the importance of low-calorie dense foods. The concept of mindful eating will be introduced as well as the importance of paying attention to internal hunger signals. Triggers for non-hunger eating and techniques for dealing with triggers will be explored. The purpose of this lesson is to provide patients with the opportunity to review the basic principles of the Pritikin Eating Plan, discuss the value of eating mindfully and how to measure internal cues of hunger and fullness using the Hunger Scale. Patients will also discuss reasons for non-hunger eating and learn strategies to use for controlling emotional eating.  Targeting Your Nutrition Priorities Clinical staff led group instruction and group discussion with PowerPoint presentation and patient guidebook. To enhance the learning environment the use of posters, models and videos may be added. Patients will learn how to determine their genetic susceptibility to disease by reviewing their family history. Patients will gain insight into the importance of diet as part of an overall healthy lifestyle in mitigating the impact of genetics and other environmental insults. The purpose of this lesson is to provide patients with the opportunity to assess their personal nutrition priorities by looking at their family history, their own health history and current risk factors. Patients will also be able to discuss ways of prioritizing and modifying the Pritikin Eating Plan for their highest risk areas  Menu  Clinical staff led group instruction and group discussion with PowerPoint presentation and patient guidebook. To  enhance the learning environment the use of posters, models and videos may be added. Using menus brought in from [employer], or printed from [employer], patients will apply the Pritikin dining out guidelines that were presented in the [employer] video. Patients will also be able to practice these guidelines in a variety of provided scenarios. The purpose of this lesson is to provide patients with the opportunity to practice hands-on learning of the Pritikin Dining Out guidelines with actual menus and practice scenarios.  Label Reading Clinical staff led group instruction and group discussion with PowerPoint presentation and patient guidebook. To enhance the learning environment the use of posters, models and videos may be added. Patients will review and discuss the Pritikin label reading guidelines presented in Pritikin's Label Reading Educational series video. Using fool labels brought in from local grocery stores and markets, patients will apply the label reading guidelines and determine if the packaged food meet the Pritikin guidelines. The purpose of this lesson is to provide patients with the opportunity to review, discuss, and practice hands-on learning of the Pritikin Label Reading guidelines with actual packaged food labels. Cooking School  Pritikin's [employer] are designed to teach patients ways to prepare quick, simple, and affordable recipes at home. The importance of nutrition's role in chronic disease risk reduction is reflected in its emphasis in the overall Pritikin program. By learning how to prepare essential core Pritikin Eating Plan recipes, patients will increase control over what they eat; be able to customize the flavor of foods without the use of added salt, sugar, or fat; and improve the quality of the food they consume. By learning a set  of core recipes which are easily assembled, quickly prepared, and affordable, patients are more likely to  prepare more healthy foods at home. These workshops focus on convenient breakfasts, simple entres, side dishes, and desserts which can be prepared with minimal effort and are consistent with nutrition recommendations for cardiovascular risk reduction. 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 The chef or RD has a clear understanding of the importance of minimizing - if not completely eliminating - added fat, sugar, and sodium in recipes. Throughout the series of Cooking School Workshop sessions, patients will learn about healthy ingredients and efficient methods of cooking to build confidence in their capability to prepare    Cooking School weekly topics:  Adding Flavor- Sodium-Free  Fast and Healthy Breakfasts  Powerhouse Plant-Based Proteins  Satisfying Salads and Dressings  Simple Sides and Sauces  International Cuisine-Spotlight on the [employer] Zones  Delicious Desserts  Savory Soups  [employer] - Meals in a Astronomer Appetizers and Snacks  Comforting Weekend Breakfasts  One-Pot Wonders   Fast Evening Meals  Landscape architect Your Pritikin Plate  WORKSHOPS   Healthy Mindset (Psychosocial):  Focused Goals, Sustainable Changes Clinical staff led group instruction and group discussion with PowerPoint presentation and patient guidebook. To enhance the learning environment the use of posters, models and videos may be added. Patients will be able to apply effective goal setting strategies to establish at least one personal goal, and then take consistent, meaningful action toward that goal. They will learn to identify common barriers to achieving personal goals and develop strategies to overcome them. Patients will also gain an understanding of how our mind-set can impact our ability to achieve goals and the importance of cultivating a positive and growth-oriented mind-set. The purpose  of this lesson is to provide patients with a deeper understanding of how to set and achieve personal goals, as well as the tools and strategies needed to overcome common obstacles which may arise along the way.  From Head to Heart: The Power of a Healthy Outlook  Clinical staff led group instruction and group discussion with PowerPoint presentation and patient guidebook. To enhance the learning environment the use of posters, models and videos may be added. Patients will be able to recognize and describe the impact of emotions and mood on physical health. They will discover the importance of self-care and explore self-care practices which may work for them. Patients will also learn how to utilize the 4 C's to cultivate a healthier outlook and better manage stress and challenges. The purpose of this lesson is to demonstrate to patients how a healthy outlook is an essential part of maintaining good health, especially as they continue their cardiac rehab journey.  Healthy Sleep for a Healthy Heart Clinical staff led group instruction and group discussion with PowerPoint presentation and patient guidebook. To enhance the learning environment the use of posters, models and videos may be added. At the conclusion of this workshop, patients will be able to demonstrate knowledge of the importance of sleep to overall health, well-being, and quality of life. They will understand the symptoms of, and treatments for, common sleep disorders. Patients will also be able to identify daytime and nighttime behaviors which impact sleep, and they will be able to apply these tools to help manage sleep-related challenges. The purpose of this lesson is to provide patients with a general overview of sleep and outline the importance  of quality sleep. Patients will learn about a few of the most common sleep disorders. Patients will also be introduced to the concept of "sleep hygiene," and discover ways to self-manage certain sleeping  problems through simple daily behavior changes. Finally, the workshop will motivate patients by clarifying the links between quality sleep and their goals of heart-healthy living.   Recognizing and Reducing Stress Clinical staff led group instruction and group discussion with PowerPoint presentation and patient guidebook. To enhance the learning environment the use of posters, models and videos may be added. At the conclusion of this workshop, patients will be able to understand the types of stress reactions, differentiate between acute and chronic stress, and recognize the impact that chronic stress has on their health. They will also be able to apply different coping mechanisms, such as reframing negative self-talk. Patients will have the opportunity to practice a variety of stress management techniques, such as deep abdominal breathing, progressive muscle relaxation, and/or guided imagery.  The purpose of this lesson is to educate patients on the role of stress in their lives and to provide healthy techniques for coping with it.  Learning Barriers/Preferences:   Education Topics:  Knowledge Questionnaire Score:  Knowledge Questionnaire Score - 03/28/24 1004       Knowledge Questionnaire Score   Pre Score 21/24          Core Components/Risk Factors/Patient Goals at Admission:  Personal Goals and Risk Factors at Admission - 03/28/24 1005       Core Components/Risk Factors/Patient Goals on Admission    Weight Management Yes;Weight Maintenance    Intervention Weight Management: Develop a combined nutrition and exercise program designed to reach desired caloric intake, while maintaining appropriate intake of nutrient and fiber, sodium and fats, and appropriate energy expenditure required for the weight goal.;Weight Management: Provide education and appropriate resources to help participant work on and attain dietary goals.    Expected Outcomes Short Term: Continue to assess and modify  interventions until short term weight is achieved;Weight Maintenance: Understanding of the daily nutrition guidelines, which includes 25-35% calories from fat, 7% or less cal from saturated fats, less than 200mg  cholesterol, less than 1.5gm of sodium, & 5 or more servings of fruits and vegetables daily;Long Term: Adherence to nutrition and physical activity/exercise program aimed toward attainment of established weight goal;Understanding recommendations for meals to include 15-35% energy as protein, 25-35% energy from fat, 35-60% energy from carbohydrates, less than 200mg  of dietary cholesterol, 20-35 gm of total fiber daily;Understanding of distribution of calorie intake throughout the day with the consumption of 4-5 meals/snacks    Diabetes Yes    Intervention Provide education about signs/symptoms and action to take for hypo/hyperglycemia.;Provide education about proper nutrition, including hydration, and aerobic/resistive exercise prescription along with prescribed medications to achieve blood glucose in normal ranges: Fasting glucose 65-99 mg/dL    Expected Outcomes Long Term: Attainment of HbA1C < 7%.;Short Term: Participant verbalizes understanding of the signs/symptoms and immediate care of hyper/hypoglycemia, proper foot care and importance of medication, aerobic/resistive exercise and nutrition plan for blood glucose control.    Heart Failure Yes    Intervention Provide a combined exercise and nutrition program that is supplemented with education, support and counseling about heart failure. Directed toward relieving symptoms such as shortness of breath, decreased exercise tolerance, and extremity edema.    Expected Outcomes Long term: Adoption of self-care skills and reduction of barriers for early signs and symptoms recognition and intervention leading to self-care maintenance.;Short term: Daily weights obtained  and reported for increase. Utilizing diuretic protocols set by physician.;Short term:  Attendance in program 2-3 days a week with increased exercise capacity. Reported lower sodium intake. Reported increased fruit and vegetable intake. Reports medication compliance.;Improve functional capacity of life    Hypertension Yes    Intervention Provide education on lifestyle modifcations including regular physical activity/exercise, weight management, moderate sodium restriction and increased consumption of fresh fruit, vegetables, and low fat dairy, alcohol moderation, and smoking cessation.;Monitor prescription use compliance.    Expected Outcomes Short Term: Continued assessment and intervention until BP is < 140/44mm HG in hypertensive participants. < 130/82mm HG in hypertensive participants with diabetes, heart failure or chronic kidney disease.;Long Term: Maintenance of blood pressure at goal levels.    Lipids Yes    Intervention Provide education and support for participant on nutrition & aerobic/resistive exercise along with prescribed medications to achieve LDL 70mg , HDL >40mg .    Expected Outcomes Long Term: Cholesterol controlled with medications as prescribed, with individualized exercise RX and with personalized nutrition plan. Value goals: LDL < 70mg , HDL > 40 mg.;Short Term: Participant states understanding of desired cholesterol values and is compliant with medications prescribed. Participant is following exercise prescription and nutrition guidelines.    Stress Yes    Intervention Refer participants experiencing significant psychosocial distress to appropriate mental health specialists for further evaluation and treatment. When possible, include family members and significant others in education/counseling sessions.;Offer individual and/or small group education and counseling on adjustment to heart disease, stress management and health-related lifestyle change. Teach and support self-help strategies.    Expected Outcomes Long Term: Emotional wellbeing is indicated by absence of  clinically significant psychosocial distress or social isolation.;Short Term: Participant demonstrates changes in health-related behavior, relaxation and other stress management skills, ability to obtain effective social support, and compliance with psychotropic medications if prescribed.          Core Components/Risk Factors/Patient Goals Review:   Goals and Risk Factor Review     Row Name 04/06/24 0914             Core Components/Risk Factors/Patient Goals Review   Personal Goals Review Weight Management/Obesity;Diabetes;Heart Failure;Hypertension;Lipids;Stress       Review Hayden [name] is doing well with exercise at cardiac rehab. Vital signs have been stable. Hayden [name] has increased his met levels.       Expected Outcomes Pt will continue to particpate in cardiac rehab for exercise, nutrition and lifestyle modifications          Core Components/Risk Factors/Patient Goals at Discharge (Final Review):   Goals and Risk Factor Review - 04/06/24 0914       Core Components/Risk Factors/Patient Goals Review   Personal Goals Review Weight Management/Obesity;Diabetes;Heart Failure;Hypertension;Lipids;Stress    Review Hayden [name] is doing well with exercise at cardiac rehab. Vital signs have been stable. Hayden [name] has increased his met levels.    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 Introduction to pritikin education/intensive cardiac rehab. Initial orientation packet reviewed with patient. 30 Day ITP Review. [place]o has good attendance and participation with exercise at cardiac rehab         Comments: See ITP comments

## 2024-04-09 ENCOUNTER — Encounter (HOSPITAL_COMMUNITY)
Admission: RE | Admit: 2024-04-09 | Discharge: 2024-04-09 | Disposition: A | Discharge status: Home / Self Care | Source: Ambulatory Visit | Attending: Cardiology

## 2024-04-09 DIAGNOSIS — Z955 Presence of coronary angioplasty implant and graft: Secondary | ICD-10-CM

## 2024-04-11 ENCOUNTER — Encounter (HOSPITAL_COMMUNITY)
Admission: RE | Admit: 2024-04-11 | Discharge: 2024-04-11 | Disposition: A | Discharge status: Home / Self Care | Source: Ambulatory Visit | Attending: Cardiology | Admitting: Cardiology

## 2024-04-11 DIAGNOSIS — Z955 Presence of coronary angioplasty implant and graft: Secondary | ICD-10-CM | POA: Diagnosis not present

## 2024-04-13 ENCOUNTER — Encounter (HOSPITAL_COMMUNITY)
Admission: RE | Admit: 2024-04-13 | Discharge: 2024-04-13 | Disposition: A | Discharge status: Home / Self Care | Source: Ambulatory Visit | Attending: Cardiology | Admitting: Cardiology

## 2024-04-13 DIAGNOSIS — Z955 Presence of coronary angioplasty implant and graft: Secondary | ICD-10-CM | POA: Diagnosis not present

## 2024-04-16 ENCOUNTER — Other Ambulatory Visit (HOSPITAL_BASED_OUTPATIENT_CLINIC_OR_DEPARTMENT_OTHER): Payer: Self-pay

## 2024-04-16 ENCOUNTER — Encounter (HOSPITAL_COMMUNITY)
Admission: RE | Admit: 2024-04-16 | Discharge: 2024-04-16 | Disposition: A | Discharge status: Home / Self Care | Source: Ambulatory Visit | Attending: Cardiology

## 2024-04-16 DIAGNOSIS — R399 Unspecified symptoms and signs involving the genitourinary system: Secondary | ICD-10-CM | POA: Diagnosis not present

## 2024-04-16 DIAGNOSIS — Z955 Presence of coronary angioplasty implant and graft: Secondary | ICD-10-CM

## 2024-04-16 DIAGNOSIS — I5032 Chronic diastolic (congestive) heart failure: Secondary | ICD-10-CM | POA: Diagnosis not present

## 2024-04-16 MED ORDER — NITROFURANTOIN MONOHYD MACRO 100 MG PO CAPS
100.0000 mg | ORAL_CAPSULE | Freq: Two times a day (BID) | ORAL | 0 refills | Status: DC
  Filled 2024-04-16: qty 14, 7d supply, fill #0

## 2024-04-18 ENCOUNTER — Encounter (HOSPITAL_COMMUNITY)
Admission: RE | Admit: 2024-04-18 | Discharge: 2024-04-18 | Disposition: A | Discharge status: Home / Self Care | Source: Ambulatory Visit | Attending: Cardiology | Admitting: Cardiology

## 2024-04-18 DIAGNOSIS — Z955 Presence of coronary angioplasty implant and graft: Secondary | ICD-10-CM

## 2024-04-20 ENCOUNTER — Encounter (HOSPITAL_COMMUNITY)
Admission: RE | Admit: 2024-04-20 | Discharge: 2024-04-20 | Disposition: A | Discharge status: Home / Self Care | Source: Ambulatory Visit | Attending: Cardiology | Admitting: Cardiology

## 2024-04-20 DIAGNOSIS — Z955 Presence of coronary angioplasty implant and graft: Secondary | ICD-10-CM

## 2024-04-23 ENCOUNTER — Other Ambulatory Visit: Payer: Self-pay | Admitting: Cardiology

## 2024-04-23 ENCOUNTER — Other Ambulatory Visit (HOSPITAL_BASED_OUTPATIENT_CLINIC_OR_DEPARTMENT_OTHER): Payer: Self-pay

## 2024-04-23 ENCOUNTER — Encounter (HOSPITAL_COMMUNITY)
Admission: RE | Admit: 2024-04-23 | Discharge: 2024-04-23 | Disposition: A | Discharge status: Home / Self Care | Source: Ambulatory Visit | Attending: Cardiology | Admitting: Cardiology

## 2024-04-23 DIAGNOSIS — Z955 Presence of coronary angioplasty implant and graft: Secondary | ICD-10-CM

## 2024-04-24 ENCOUNTER — Other Ambulatory Visit: Payer: Self-pay

## 2024-04-24 ENCOUNTER — Other Ambulatory Visit (HOSPITAL_BASED_OUTPATIENT_CLINIC_OR_DEPARTMENT_OTHER): Payer: Self-pay

## 2024-04-24 MED FILL — Bisoprolol Fumarate Tab 5 MG: ORAL | 90 days supply | Qty: 45 | Fill #0 | Status: AC

## 2024-04-25 ENCOUNTER — Encounter (HOSPITAL_COMMUNITY)
Admission: RE | Admit: 2024-04-25 | Discharge: 2024-04-25 | Disposition: A | Discharge status: Home / Self Care | Source: Ambulatory Visit | Attending: Cardiology

## 2024-04-25 DIAGNOSIS — Z955 Presence of coronary angioplasty implant and graft: Secondary | ICD-10-CM

## 2024-04-27 ENCOUNTER — Encounter (HOSPITAL_COMMUNITY)
Admission: RE | Admit: 2024-04-27 | Discharge: 2024-04-27 | Disposition: A | Discharge status: Home / Self Care | Source: Ambulatory Visit | Attending: Cardiology | Admitting: Cardiology

## 2024-04-27 DIAGNOSIS — Z955 Presence of coronary angioplasty implant and graft: Secondary | ICD-10-CM

## 2024-05-02 ENCOUNTER — Encounter (HOSPITAL_COMMUNITY)
Admission: RE | Admit: 2024-05-02 | Discharge: 2024-05-02 | Disposition: A | Discharge status: Home / Self Care | Source: Ambulatory Visit | Attending: Cardiology | Admitting: Cardiology

## 2024-05-02 DIAGNOSIS — Z48812 Encounter for surgical aftercare following surgery on the circulatory system: Secondary | ICD-10-CM | POA: Insufficient documentation

## 2024-05-02 DIAGNOSIS — Z955 Presence of coronary angioplasty implant and graft: Secondary | ICD-10-CM | POA: Diagnosis not present

## 2024-05-02 MED FILL — Evolocumab Subcutaneous Soln Auto-Injector 140 MG/ML: SUBCUTANEOUS | 28 days supply | Qty: 2 | Fill #7 | Status: AC

## 2024-05-04 ENCOUNTER — Encounter (HOSPITAL_COMMUNITY)
Admission: RE | Admit: 2024-05-04 | Discharge: 2024-05-04 | Disposition: A | Discharge status: Home / Self Care | Source: Ambulatory Visit | Attending: Cardiology | Admitting: Cardiology

## 2024-05-04 DIAGNOSIS — Z955 Presence of coronary angioplasty implant and graft: Secondary | ICD-10-CM

## 2024-05-07 ENCOUNTER — Encounter (HOSPITAL_COMMUNITY)
Admission: RE | Admit: 2024-05-07 | Discharge: 2024-05-07 | Disposition: A | Discharge status: Home / Self Care | Source: Ambulatory Visit | Attending: Cardiology | Admitting: Cardiology

## 2024-05-07 DIAGNOSIS — Z955 Presence of coronary angioplasty implant and graft: Secondary | ICD-10-CM | POA: Diagnosis not present

## 2024-05-07 NOTE — Progress Notes (Signed)
 Cardiac Individual Treatment Plan  Patient Details  Name: Hayden [name] MRN: [id number] Date of Birth: 01/15/1946 Referring Provider:   Flowsheet Row INTENSIVE CARDIAC REHAB ORIENT from 03/28/2024 in [facility] for [facility], V[facility], & [facility]  Referring Provider Gordy Bergamo, MD    Initial Encounter Date:  Flowsheet Row INTENSIVE CARDIAC REHAB ORIENT from 03/28/2024 in [facility] for Heart, Vascular, & Lung Health  Date 03/28/24    Visit Diagnosis: 02/29/24 DES LM & LCx  Patient's Home Medications on Admission:  Current Outpatient Medications:    amLODipine  (NORVASC ) 10 MG tablet, Take 1 tablet (10 mg total) by mouth daily., Disp: 90 tablet, Rfl: 3   ascorbic acid (VITAMIN C) 1000 MG tablet, Take 500 mg by mouth daily., Disp: , Rfl:    aspirin  EC 81 MG tablet, Take 1 tablet (81 mg total) by mouth daily. Swallow whole., Disp: , Rfl:    Bexagliflozin (BRENZAVVY) 20 MG TABS, Take 1 tablet by mouth daily., Disp: , Rfl:    bisoprolol  (ZEBETA ) 5 MG tablet, Take 0.5 tablets (2.5 mg total) by mouth daily., Disp: 45 tablet, Rfl: 3   Cholecalciferol  (VITAMIN D ) 2000 UNITS tablet, Take 2,000 Units by mouth daily., Disp: , Rfl:    clopidogrel  (PLAVIX ) 75 MG tablet, Take 1 tablet (75 mg total) by mouth daily., Disp: 90 tablet, Rfl: 3   Coenzyme Q10 (CO Q 10 PO), Take 1 tablet by mouth daily., Disp: , Rfl:    Evolocumab  (REPATHA  SURECLICK) 140 MG/ML SOAJ, Inject 140 mg into the skin every 14 (fourteen) days., Disp: 2 mL, Rfl: 11   hyoscyamine  (LEVSIN ) 0.125 MG tablet, Take 1 tablet (0.125 mg total) by mouth every 4 (four) hours as needed., Disp: 30 tablet, Rfl: 1   levothyroxine  (SYNTHROID ) 75 MCG tablet, Take 1 tablet (75 mcg total) by mouth every morning on an empty stomach., Disp: 90 tablet, Rfl: 3   losartan  (COZAAR ) 25 MG tablet, Take 1 tablet (25 mg total) by mouth daily., Disp: 90 tablet, Rfl: 4   metFORMIN  (GLUCOPHAGE -XR) 500 MG 24 hr tablet,  Take 500 mg by mouth 3 (three) times daily., Disp: , Rfl:    metFORMIN  (GLUCOPHAGE -XR) 500 MG 24 hr tablet, Take 3 tablets (1,500 mg total) by mouth every evening with a meal., Disp: 270 tablet, Rfl: 4   Multiple Vitamin (MULTIVITAMIN WITH MINERALS) TABS, Take 1 tablet by mouth daily., Disp: , Rfl:    nitrofurantoin , macrocrystal-monohydrate, (MACROBID ) 100 MG capsule, Take 1 capsule (100 mg total) by mouth every 12 (twelve) hours., Disp: 14 capsule, Rfl: 0   nitroGLYCERIN  (NITROSTAT ) 0.4 MG SL tablet, Place 1 tablet (0.4 mg total) under the tongue every 5 (five) minutes as needed., Disp: 25 tablet, Rfl: 2   pantoprazole  (PROTONIX ) 40 MG tablet, Take 1 tablet (40 mg total) by mouth daily., Disp: 90 tablet, Rfl: 3   predniSONE  (STERAPRED UNI-PAK 21 TAB) 5 MG (21) TBPK tablet, Take as directed (Patient not taking: Reported on 03/28/2024), Disp: 21 each, Rfl: 1  Past Medical History: Past Medical History:  Diagnosis Date   Anxiety disorder    No details   Aortic root dilatation (HCC)    borderline at 38mm by echo 08/2023   Arthritis    Bright's disease age 35   Carotid artery stenosis    1-39% stenosis bilateraly by dopplers 2020   Chronic combined systolic (congestive) and diastolic (congestive) heart failure (HCC)    EF 40-45% by evho 08/2023  Coronary artery disease    MI in 1994 with PTCA of left circ and then coronary bypass 2000   Frequent urination    H/O hiatal hernia    Hyperlipidemia    statin intolerant   Hypertension    Hypogonadism male    No details available but receives replacement therapy   Impaired hearing    both ears   Kidney stones    100  in past plus stones   Myocardial infarction (HCC) 10/31/1989   Opiate addiction (HCC)    PONV (postoperative nausea and vomiting)    pt prefers epidural is possible   Pulmonary regurgitation    moderate by echo 10/2019    Tobacco Use: Social History   Tobacco Use  Smoking Status Never  Smokeless Tobacco Never     Labs: Review Flowsheet  More data exists      Latest Ref Rng & Units 07/13/2016 04/13/2021 06/24/2021 02/02/2023 05/18/2023  Labs for ITP Cardiac and Pulmonary Rehab  Cholestrol 100 - 199 mg/dL 804  777  66  79  87   LDL (calc) 0 - 99 mg/dL 861  832  17  18  24    HDL-C >39 mg/dL 27  26  27  31   35   Trlycerides 0 - 149 mg/dL 851  843  880  811  832      Exercise Target Goals: Exercise Program Goal: Individual exercise prescription set using results from initial 6 min walk test and THRR while considering  patient's activity barriers and safety.   Exercise Prescription Goal: Initial exercise prescription builds to 30-45 minutes a day of aerobic activity, 2-3 days per week.  Home exercise guidelines will be given to patient during program as part of exercise prescription that the participant will acknowledge.   Education: Aerobic Exercise: - Group verbal and visual presentation on the components of exercise prescription. Introduces F.I.T.T principle from ACSM for exercise prescriptions.  Reviews F.I.T.T. principles of aerobic exercise including progression. Written material provided at class time.   Education: Resistance Exercise: - Group verbal and visual presentation on the components of exercise prescription. Introduces F.I.T.T principle from ACSM for exercise prescriptions  Reviews F.I.T.T. principles of resistance exercise including progression. Written material provided at class time.    Education: Exercise & Equipment Safety: - Individual verbal instruction and demonstration of equipment use and safety with use of the equipment.   Education: Exercise Physiology & General Exercise Guidelines: - Group verbal and written instruction with models to review the exercise physiology of the cardiovascular system and associated critical values. Provides general exercise guidelines with specific guidelines to those with heart or lung disease. Written material provided at class  time.   Education: Flexibility, Balance, Mind/Body Relaxation: - Group verbal and visual presentation with interactive activity on the components of exercise prescription. Introduces F.I.T.T principle from ACSM for exercise prescriptions. Reviews F.I.T.T. principles of flexibility and balance exercise training including progression. Also discusses the mind body connection.  Reviews various relaxation techniques to help reduce and manage stress (i.e. Deep breathing, progressive muscle relaxation, and visualization). Balance handout provided to take home. Written material provided at class time.   Activity Barriers & Risk Stratification:  Activity Barriers & Cardiac Risk Stratification - 03/28/24 1001       Activity Barriers & Cardiac Risk Stratification   Activity Barriers Arthritis;Joint Problems;Right Knee Replacement;Decreased Ventricular Function    Cardiac Risk Stratification High   <5 METs on         6  Minute Walk:  6 Minute Walk     Row Name 03/28/24 1000         6 Minute Walk   Phase Initial     Distance 1250 feet     Walk Time 6 minutes     # of Rest Breaks 0     MPH 2.37     METS 2.71     RPE 10     Perceived Dyspnea  0     VO2 Peak 9.47     Symptoms No     Resting HR 87 bpm     Resting BP 130/62     Resting Oxygen Saturation  98 %     Exercise Oxygen Saturation  during 6 min walk 98 %     Max Ex. HR 105 bpm     Max Ex. BP 150/62     2 Minute Post BP 124/66        Oxygen Initial Assessment:   Oxygen Re-Evaluation:   Oxygen Discharge (Final Oxygen Re-Evaluation):   Initial Exercise Prescription:  Initial Exercise Prescription - 03/28/24 1000       Date of Initial Exercise RX and Referring Provider   Date 03/28/24    Referring Provider Gordy Bergamo, MD    Expected Discharge Date 06/20/24      Treadmill   MPH 2    Grade 0    Minutes 15    METs 2.5      Recumbant Elliptical   Level 1    RPM 50    Watts 70    Minutes 15    METs 2       Prescription Details   Frequency (times per week) 3    Duration Progress to 30 minutes of continuous aerobic without signs/symptoms of physical distress      Intensity   THRR 40-80% of Max Heartrate 57-114    Ratings of Perceived Exertion 11-13    Perceived Dyspnea 0-4      Progression   Progression Continue progressive overload as per policy without signs/symptoms or physical distress.      Resistance Training   Training Prescription Yes    Weight 3    Reps 10-15          Perform Capillary Blood Glucose checks as needed.  Exercise Prescription Changes:   Exercise Prescription Changes     Row Name 04/02/24 [phone] 04/23/24 0812 05/02/24 0830         Response to Exercise   Blood Pressure (Admit) 142/70 120/68 120/60     Blood Pressure (Exercise) 136/82 128/80 --     Blood Pressure (Exit) 138/60 114/60 112/58     Heart Rate (Admit) 76 bpm 86 bpm 61 bpm     Heart Rate (Exercise) 106 bpm 109 bpm 101 bpm     Heart Rate (Exit) 86 bpm 92 bpm 70 bpm     Rating of Perceived Exertion (Exercise) 2.84 11.5 13     Perceived Dyspnea (Exercise) 0 0 0     Symptoms none none none     Comments Pt first day in the [employer] program. REviewed MET's and goals REVD MET's     Duration Progress to 30 minutes of  aerobic without signs/symptoms of physical distress Progress to 30 minutes of  aerobic without signs/symptoms of physical distress Progress to 30 minutes of  aerobic without signs/symptoms of physical distress     Intensity THRR unchanged THRR unchanged THRR unchanged  Progression   Progression Continue to progress workloads to maintain intensity without signs/symptoms of physical distress. Continue to progress workloads to maintain intensity without signs/symptoms of physical distress. Continue to progress workloads to maintain intensity without signs/symptoms of physical distress.     Average METs 3.92 4.67 4.42       Resistance Training   Training Prescription Yes Yes No      Weight 3 5 5      Reps 10-15 10-15 10-15     Time 10 Minutes 10 Minutes 10 Minutes       Treadmill   MPH 2.4 2.7 2.7     Grade 0 1 1     Minutes 15 15 15      METs 2.84 3.44 3.44       Recumbant Elliptical   Level 1 2 2      RPM 65 59 67     Watts 103 79 98     Minutes 15 15 15      METs 5 5.9 5.4        Exercise Comments:   Exercise Comments     Row Name 04/02/24 0840 04/23/24 0817 05/02/24 0830       Exercise Comments Pt first day in the [facility] program. Pt tolerated exercise well with an average MET level of 3.92. Pt is off to a good start and is learning his THRR, RPE and ExRx Reviewed MET's and goals. Pt tolerated exercise well with an average MET level of 4.67. Asymptomatic PVC's more frequent today, but did decrease with exercise. He is doing well and progressing WL's and MET's. He is feeling an increase in strength and stamina. Reviewed MET's. Pt tolerated exercise well with an average MET level of 4.42. Will work on gradual progression without envoking more PVC's        Exercise Goals and Review:   Exercise Goals     Row Name 03/28/24 1002             Exercise Goals   Increase Physical Activity Yes       Intervention Develop an individualized exercise prescription for aerobic and resistive training based on initial evaluation findings, risk stratification, comorbidities and participant's personal goals.;Provide advice, education, support and counseling about physical activity/exercise needs.       Expected Outcomes Short Term: Attend rehab on a regular basis to increase amount of physical activity.;Long Term: Exercising regularly at least 3-5 days a week.;Long Term: Add in home exercise to make exercise part of routine and to increase amount of physical activity.       Increase Strength and Stamina Yes       Intervention Provide advice, education, support and counseling about physical activity/exercise needs.;Develop an individualized exercise prescription  for aerobic and resistive training based on initial evaluation findings, risk stratification, comorbidities and participant's personal goals.       Expected Outcomes Short Term: Perform resistance training exercises routinely during rehab and add in resistance training at home;Short Term: Increase workloads from initial exercise prescription for resistance, speed, and METs.;Long Term: Improve cardiorespiratory fitness, muscular endurance and strength as measured by increased METs and functional capacity ( )       Able to understand and use rate of perceived exertion (RPE) scale Yes       Intervention Provide education and explanation on how to use RPE scale       Expected Outcomes Short Term: Able to use RPE daily in rehab to express subjective intensity level;Long Term:  Able  to use RPE to guide intensity level when exercising independently       Knowledge and understanding of Target Heart Rate Range (THRR) Yes       Intervention Provide education and explanation of THRR including how the numbers were predicted and where they are located for reference       Expected Outcomes Long Term: Able to use THRR to govern intensity when exercising independently;Short Term: Able to state/look up THRR;Short Term: Able to use daily as guideline for intensity in rehab       Understanding of Exercise Prescription Yes       Intervention Provide education, explanation, and written materials on patient's individual exercise prescription       Expected Outcomes Short Term: Able to explain program exercise prescription;Long Term: Able to explain home exercise prescription to exercise independently          Exercise Goals Re-Evaluation :  Exercise Goals Re-Evaluation     Row Name 04/02/24 0838 04/23/24 0814           Exercise Goal Re-Evaluation   Exercise Goals Review Increase Physical Activity;Understanding of Exercise Prescription;Increase Strength and Stamina;Knowledge and understanding of Target Heart Rate  Range (THRR);Able to understand and use rate of perceived exertion (RPE) scale Increase Physical Activity;Understanding of Exercise Prescription;Increase Strength and Stamina;Knowledge and understanding of Target Heart Rate Range (THRR);Able to understand and use rate of perceived exertion (RPE) scale      Comments Pt first day in the [facility] program. Pt tolerated exercise well with an average MET level of 3.92. Pt is off to a good start and is learning his THRR, RPE and ExRx Reviewed MET's and goals. Pt tolerated exercise well with an average MET level of 4.67. Asymptomatic PVC's more frequent today, but did decrease with exercise. He is doing well and progressing WL's and MET's. He is feeling an increase in strength and stamina.      Expected Outcomes Will contniue to monitor patient and progress workloads as tolerated without sign or symptom Will contniue to monitor patient and progress workloads as tolerated without sign or symptom         Discharge Exercise Prescription (Final Exercise Prescription Changes):  Exercise Prescription Changes - 05/02/24 0830       Response to Exercise   Blood Pressure (Admit) 120/60    Blood Pressure (Exit) 112/58    Heart Rate (Admit) 61 bpm    Heart Rate (Exercise) 101 bpm    Heart Rate (Exit) 70 bpm    Rating of Perceived Exertion (Exercise) 13    Perceived Dyspnea (Exercise) 0    Symptoms none    Comments REVD MET's    Duration Progress to 30 minutes of  aerobic without signs/symptoms of physical distress    Intensity THRR unchanged      Progression   Progression Continue to progress workloads to maintain intensity without signs/symptoms of physical distress.    Average METs 4.42      Resistance Training   Training Prescription No    Weight 5    Reps 10-15    Time 10 Minutes      Treadmill   MPH 2.7    Grade 1    Minutes 15    METs 3.44      Recumbant Elliptical   Level 2    RPM 67    Watts 98    Minutes 15    METs 5.4  Nutrition:  Target Goals: Understanding of nutrition guidelines, daily intake of sodium 1500mg , cholesterol 200mg , calories 30% from fat and 7% or less from saturated fats, daily to have 5 or more servings of fruits and vegetables.  Education: Nutrition 1 -Group instruction provided by verbal, written material, interactive activities, discussions, models, and posters to present general guidelines for heart healthy nutrition including macronutrients, label reading, and promoting whole foods over processed counterparts. Education serves as Pensions consultant of discussion of heart healthy eating for all. Written material provided at class time.    Education: Nutrition 2 -Group instruction provided by verbal, written material, interactive activities, discussions, models, and posters to present general guidelines for heart healthy nutrition including sodium, cholesterol, and saturated fat. Providing guidance of habit forming to improve blood pressure, cholesterol, and body weight. Written material provided at class time.     Biometrics:  Pre Biometrics - 03/28/24 0852       Pre Biometrics   Waist Circumference 38.5 inches    Hip Circumference 39 inches    Waist to Hip Ratio 0.99 %    Triceps Skinfold 15 mm    % Body Fat 26.4 %    Grip Strength 26 kg    Flexibility 12.25 in    Single Leg Stand 30 seconds           Nutrition Therapy Plan and Nutrition Goals:  Nutrition Therapy & Goals - 04/02/24 1037       Nutrition Therapy   Diet Heart Healthy Diet      Personal Nutrition Goals   Nutrition Goal Patient to identify strategies for reducing cardiovascular risk by attending the [facility] education and nutrition series weekly.    Personal Goal #2 Patient to improve diet quality by using the plate method as a guide for meal planning to include lean protein/plant protein, fruits, vegetables, whole grains, nonfat dairy as part of a well-balanced diet.    Comments Patient has medical  history of s/p coronary artery stent placement, CABGx3, HTN, hyperlipidemia, CHF, CABGx3. LDL is well controlled (on repatha ). A1c is not at goal (metformin ). At 03/14/24 follow-up, metamucil and flaxseed was recommended for history of elevated triglycerides. Patient will benefit from participation in intensive cardiac rehab for nutrition education, exercise, and lifestyle modification.      Intervention Plan   Intervention Prescribe, educate and counsel regarding individualized specific dietary modifications aiming towards targeted core components such as weight, hypertension, lipid management, diabetes, heart failure and other comorbidities.;Nutrition handout(s) given to patient.    Expected Outcomes Short Term Goal: Understand basic principles of dietary content, such as calories, fat, sodium, cholesterol and nutrients.;Long Term Goal: Adherence to prescribed nutrition plan.          Nutrition Assessments:  Nutrition Assessments - 04/02/24 1110       Rate Your Plate Scores   Pre Score 78         MEDIFICTS Score Key: >=70 Need to make dietary changes  40-70 Heart Healthy Diet <= 40 Therapeutic Level Cholesterol Diet  Flowsheet Row INTENSIVE CARDIAC REHAB from 04/02/2024 in [facility] for Heart, Vascular, & Lung Health  Picture Your Plate Total Score on Admission 78   Picture Your Plate Scores: <59 Unhealthy dietary pattern with much room for improvement. 41-50 Dietary pattern unlikely to meet recommendations for good health and room for improvement. 51-60 More healthful dietary pattern, with some room for improvement.  >60 Healthy dietary pattern, although there may be some specific behaviors that could be improved.  Nutrition Goals Re-Evaluation:  Nutrition Goals Re-Evaluation     Row Name 04/02/24 1037             Goals   Current Weight 166 lb 7.2 oz (75.5 kg)       Comment A1c 8.4, triglycerides 167, LDL 24       Expected Outcome Patient has  medical history of s/p coronary artery stent placement, CABGx3, HTN, hyperlipidemia, CHF, CABGx3. LDL is well controlled (on repatha ). A1c is not at goal (metformin ). At 03/14/24 follow-up, metamucil and flaxseed was recommended for history of elevated triglycerides. Patient will benefit from participation in intensive cardiac rehab for nutrition education, exercise, and lifestyle modification.          Nutrition Goals Discharge (Final Nutrition Goals Re-Evaluation):  Nutrition Goals Re-Evaluation - 04/02/24 1037       Goals   Current Weight 166 lb 7.2 oz (75.5 kg)    Comment A1c 8.4, triglycerides 167, LDL 24    Expected Outcome Patient has medical history of s/p coronary artery stent placement, CABGx3, HTN, hyperlipidemia, CHF, CABGx3. LDL is well controlled (on repatha ). A1c is not at goal (metformin ). At 03/14/24 follow-up, metamucil and flaxseed was recommended for history of elevated triglycerides. Patient will benefit from participation in intensive cardiac rehab for nutrition education, exercise, and lifestyle modification.          Psychosocial: Target Goals: Acknowledge presence or absence of significant depression and/or stress, maximize coping skills, provide positive support system. Participant is able to verbalize types and ability to use techniques and skills needed for reducing stress and depression.   Education: Stress, Anxiety, and Depression - Group verbal and visual presentation to define topics covered.  Reviews how body is impacted by stress, anxiety, and depression.  Also discusses healthy ways to reduce stress and to treat/manage anxiety and depression. Written material provided at class time.   Education: Sleep Hygiene -Provides group verbal and written instruction about how sleep can affect your health.  Define sleep hygiene, discuss sleep cycles and impact of sleep habits. Review good sleep hygiene tips.   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 He stated that he worries about them more than anything else, but not to a degree that's overwhelming. He denies any depression/anxiety or need for additional resources.      Family Dynamics   Good Support System? Yes   wife     Barriers   Psychosocial barriers to participate in program The patient should benefit from training in stress management and relaxation.      Screening Interventions   Interventions Provide feedback about the scores to participant;To provide support and resources with identified psychosocial needs;Encouraged to exercise    Expected Outcomes Short Term goal: Identification and review with participant of any Quality of Life or Depression concerns found by scoring the questionnaire.;Long Term goal: The participant improves quality of Life and PHQ9 Scores as seen by post scores and/or verbalization of changes;Long Term Goal: Stressors or current issues are controlled or eliminated.          Quality of Life Scores:   Quality of Life - 03/28/24 1003       Quality of Life   Select  Quality of Life      Quality of Life Scores   Health/Function Pre 27.5 %    Socioeconomic Pre 29.06 %    Psych/Spiritual Pre 30 %    Family Pre 28.8 %    GLOBAL Pre 28.54 %         Scores of 19 and below usually indicate a poorer quality of life in these areas.  A difference of  2-3 points is a clinically meaningful difference.  A difference of 2-3 points in the total score of the Quality of Life Index has been associated with significant improvement in overall quality of life, self-image, physical symptoms, and general health in studies assessing change in quality of life.  PHQ-9: Review Flowsheet       03/28/2024   Depression screen PHQ 2/9  Decreased Interest 0  Down, Depressed, Hopeless 0  PHQ - 2 Score 0  Altered sleeping 0  Tired, decreased energy 0  Change in appetite 0  Feeling bad or failure about yourself  0  Trouble concentrating 0  Moving slowly or fidgety/restless 0  Suicidal thoughts 0  PHQ-9 Score 0   Interpretation of Total Score  Total Score Depression Severity:  1-4 = Minimal depression, 5-9 = Mild depression, 10-14 = Moderate depression, 15-19 = Moderately severe depression, 20-27 = Severe depression   Psychosocial Evaluation and Intervention:   Psychosocial Re-Evaluation:  Psychosocial Re-Evaluation     Row Name 04/06/24 0910 05/04/24 1257           Psychosocial Re-Evaluation   Current issues with Current Stress Concerns Current Stress Concerns      Comments Family Family      Interventions Encouraged to attend Cardiac Rehabilitation for the exercise;Stress management education;Encouraged to attend Pulmonary Rehabilitation for the exercise Encouraged to attend Cardiac Rehabilitation for the exercise;Stress management education;Encouraged to attend Pulmonary Rehabilitation for the exercise        Initial Review   Source of Stress Concerns Family Family      Comments Will continue to monitor and offer support as needed Will continue to monitor and offer support as needed         Psychosocial Discharge (Final Psychosocial Re-Evaluation):  Psychosocial Re-Evaluation - 05/04/24 1257       Psychosocial Re-Evaluation   Current issues with Current Stress Concerns    Comments Family    Interventions Encouraged to attend Cardiac Rehabilitation for the exercise;Stress management education;Encouraged to attend Pulmonary Rehabilitation for the exercise      Initial Review   Source of Stress Concerns Family    Comments Will continue to monitor and offer support as needed          Vocational Rehabilitation: Provide vocational rehab assistance to qualifying  candidates.   Vocational Rehab Evaluation & Intervention:  Vocational Rehab - 03/28/24 1005       Initial Vocational Rehab Evaluation & Intervention   Assessment shows need for Vocational Rehabilitation No   works part time         Education: Education Goals: Education classes will be provided on a variety of topics geared toward better understanding of heart health and risk factor modification. Participant will state understanding/return demonstration of topics presented as noted by education test scores.  Learning Barriers/Preferences:   General Cardiac Education Topics:  AED/CPR: - Group verbal and written instruction with the use of models to demonstrate the basic use of the AED with the basic ABC's of resuscitation.   Test and Procedures: - Group verbal and  visual presentation and models provide information about basic cardiac anatomy and function. Reviews the testing methods done to diagnose heart disease and the outcomes of the test results. Describes the treatment choices: Medical Management, Angioplasty, or Coronary Bypass Surgery for treating various heart conditions including Myocardial Infarction, Angina, Valve Disease, and Cardiac Arrhythmias. Written material provided at class time.   Medication Safety: - Group verbal and visual instruction to review commonly prescribed medications for heart and lung disease. Reviews the medication, class of the drug, and side effects. Includes the steps to properly store meds and maintain the prescription regimen. Written material provided at class time.   Intimacy: - Group verbal instruction through game format to discuss how heart and lung disease can affect sexual intimacy. Written material provided at class time.   Know Your Numbers and Heart Failure: - Group verbal and visual instruction to discuss disease risk factors for cardiac and pulmonary disease and treatment options.  Reviews associated critical values for  Overweight/Obesity, Hypertension, Cholesterol, and Diabetes.  Discusses basics of heart failure: signs/symptoms and treatments.  Introduces Heart Failure Zone chart for action plan for heart failure. Written material provided at class time.   Infection Prevention: - Provides verbal and written material to individual with discussion of infection control including proper hand washing and proper equipment cleaning during exercise session.   Falls Prevention: - Provides verbal and written material to individual with discussion of falls prevention and safety.   Other: -Provides group and verbal instruction on various topics (see comments)   Knowledge Questionnaire Score:  Knowledge Questionnaire Score - 03/28/24 1004       Knowledge Questionnaire Score   Pre Score 21/24          Core Components/Risk Factors/Patient Goals at Admission:  Personal Goals and Risk Factors at Admission - 03/28/24 1005       Core Components/Risk Factors/Patient Goals on Admission    Weight Management Yes;Weight Maintenance    Intervention Weight Management: Develop a combined nutrition and exercise program designed to reach desired caloric intake, while maintaining appropriate intake of nutrient and fiber, sodium and fats, and appropriate energy expenditure required for the weight goal.;Weight Management: Provide education and appropriate resources to help participant work on and attain dietary goals.    Expected Outcomes Short Term: Continue to assess and modify interventions until short term weight is achieved;Weight Maintenance: Understanding of the daily nutrition guidelines, which includes 25-35% calories from fat, 7% or less cal from saturated fats, less than 200mg  cholesterol, less than 1.5gm of sodium, & 5 or more servings of fruits and vegetables daily;Long Term: Adherence to nutrition and physical activity/exercise program aimed toward attainment of established weight goal;Understanding recommendations  for meals to include 15-35% energy as protein, 25-35% energy from fat, 35-60% energy from carbohydrates, less than 200mg  of dietary cholesterol, 20-35 gm of total fiber daily;Understanding of distribution of calorie intake throughout the day with the consumption of 4-5 meals/snacks    Diabetes Yes    Intervention Provide education about signs/symptoms and action to take for hypo/hyperglycemia.;Provide education about proper nutrition, including hydration, and aerobic/resistive exercise prescription along with prescribed medications to achieve blood glucose in normal ranges: Fasting glucose 65-99 mg/dL    Expected Outcomes Long Term: Attainment of HbA1C < 7%.;Short Term: Participant verbalizes understanding of the signs/symptoms and immediate care of hyper/hypoglycemia, proper foot care and importance of medication, aerobic/resistive exercise and nutrition plan for blood glucose control.    Heart Failure Yes    Intervention Provide a combined exercise  and nutrition program that is supplemented with education, support and counseling about heart failure. Directed toward relieving symptoms such as shortness of breath, decreased exercise tolerance, and extremity edema.    Expected Outcomes Long term: Adoption of self-care skills and reduction of barriers for early signs and symptoms recognition and intervention leading to self-care maintenance.;Short term: Daily weights obtained and reported for increase. Utilizing diuretic protocols set by physician.;Short term: Attendance in program 2-3 days a week with increased exercise capacity. Reported lower sodium intake. Reported increased fruit and vegetable intake. Reports medication compliance.;Improve functional capacity of life    Hypertension Yes    Intervention Provide education on lifestyle modifcations including regular physical activity/exercise, weight management, moderate sodium restriction and increased consumption of fresh fruit, vegetables, and low fat  dairy, alcohol moderation, and smoking cessation.;Monitor prescription use compliance.    Expected Outcomes Short Term: Continued assessment and intervention until BP is < 140/90mm HG in hypertensive participants. < 130/43mm HG in hypertensive participants with diabetes, heart failure or chronic kidney disease.;Long Term: Maintenance of blood pressure at goal levels.    Lipids Yes    Intervention Provide education and support for participant on nutrition & aerobic/resistive exercise along with prescribed medications to achieve LDL 70mg , HDL >40mg .    Expected Outcomes Long Term: Cholesterol controlled with medications as prescribed, with individualized exercise RX and with personalized nutrition plan. Value goals: LDL < 70mg , HDL > 40 mg.;Short Term: Participant states understanding of desired cholesterol values and is compliant with medications prescribed. Participant is following exercise prescription and nutrition guidelines.    Stress Yes    Intervention Refer participants experiencing significant psychosocial distress to appropriate mental health specialists for further evaluation and treatment. When possible, include family members and significant others in education/counseling sessions.;Offer individual and/or small group education and counseling on adjustment to heart disease, stress management and health-related lifestyle change. Teach and support self-help strategies.    Expected Outcomes Long Term: Emotional wellbeing is indicated by absence of clinically significant psychosocial distress or social isolation.;Short Term: Participant demonstrates changes in health-related behavior, relaxation and other stress management skills, ability to obtain effective social support, and compliance with psychotropic medications if prescribed.          Education:Diabetes - Individual verbal and written instruction to review signs/symptoms of diabetes, desired ranges of glucose level fasting, after meals  and with exercise. Acknowledge that pre and post exercise glucose checks will be done for 3 sessions at entry of program.   Core Components/Risk Factors/Patient Goals Review:   Goals and Risk Factor Review     Row Name 04/06/24 0914 05/04/24 1258           Core Components/Risk Factors/Patient Goals Review   Personal Goals Review Weight Management/Obesity;Diabetes;Heart Failure;Hypertension;Lipids;Stress Weight Management/Obesity;Diabetes;Heart Failure;Hypertension;Lipids;Stress      Review Cordarrell is doing well with exercise at cardiac rehab. Vital signs have been stable. Tramel has increased his met levels. Jacarri is doing well with exercise at cardiac rehab. Vital signs have been stable. Eliceo has increased his met levels.      Expected Outcomes Pt will continue to particpate in cardiac rehab for exercise, nutrition and lifestyle modifications Pt will continue to particpate in cardiac rehab for exercise, nutrition and lifestyle modifications         Core Components/Risk Factors/Patient Goals at Discharge (Final Review):   Goals and Risk Factor Review - 05/04/24 1258       Core Components/Risk Factors/Patient Goals Review   Personal Goals Review Weight Management/Obesity;Diabetes;Heart Failure;Hypertension;Lipids;Stress  Review Joshuan is doing well with exercise at cardiac rehab. Vital signs have been stable. Cheikh has increased his met levels.    Expected Outcomes Pt will continue to particpate in cardiac rehab for exercise, nutrition and lifestyle modifications          ITP Comments:  ITP Comments     Row Name 03/28/24 0758 04/06/24 0908 05/04/24 1257       ITP Comments Dr. Wilbert Bihari medical director. Introduction to pritikin education/intensive cardiac rehab. Initial orientation packet reviewed with patient. 30 Day ITP Review. Chaysen has good attendance and participation with exercise at cardiac rehab 30 Day ITP Review. Martavious has good attendance and participation with exercise  at cardiac rehab        Comments: see ITP comments

## 2024-05-09 ENCOUNTER — Encounter (HOSPITAL_COMMUNITY)
Admission: RE | Admit: 2024-05-09 | Discharge: 2024-05-09 | Disposition: A | Discharge status: Home / Self Care | Source: Ambulatory Visit | Attending: Cardiology

## 2024-05-09 DIAGNOSIS — Z955 Presence of coronary angioplasty implant and graft: Secondary | ICD-10-CM | POA: Diagnosis not present

## 2024-05-11 ENCOUNTER — Encounter (HOSPITAL_COMMUNITY)

## 2024-05-14 ENCOUNTER — Telehealth (HOSPITAL_COMMUNITY): Payer: Self-pay | Admitting: *Deleted

## 2024-05-14 ENCOUNTER — Other Ambulatory Visit (HOSPITAL_BASED_OUTPATIENT_CLINIC_OR_DEPARTMENT_OTHER): Payer: Self-pay

## 2024-05-14 ENCOUNTER — Encounter (HOSPITAL_COMMUNITY): Admission: RE | Admit: 2024-05-14 | Source: Ambulatory Visit

## 2024-05-14 DIAGNOSIS — U071 COVID-19: Secondary | ICD-10-CM | POA: Diagnosis not present

## 2024-05-14 DIAGNOSIS — Z951 Presence of aortocoronary bypass graft: Secondary | ICD-10-CM | POA: Diagnosis not present

## 2024-05-14 DIAGNOSIS — Z955 Presence of coronary angioplasty implant and graft: Secondary | ICD-10-CM | POA: Diagnosis not present

## 2024-05-14 DIAGNOSIS — I2510 Atherosclerotic heart disease of native coronary artery without angina pectoris: Secondary | ICD-10-CM | POA: Diagnosis not present

## 2024-05-14 MED ORDER — LAGEVRIO 200 MG PO CAPS
4.0000 | ORAL_CAPSULE | Freq: Two times a day (BID) | ORAL | 0 refills | Status: DC
  Filled 2024-05-14: qty 40, 5d supply, fill #0

## 2024-05-14 NOTE — Telephone Encounter (Signed)
 Ranferi left voice mail message Sunday evening 9/14 informing us  he will not be attending Cardiac rehab due to being sick, possibly Covid. He plans to follow up with his Physician today.

## 2024-05-16 ENCOUNTER — Encounter (HOSPITAL_COMMUNITY)

## 2024-05-16 ENCOUNTER — Telehealth (HOSPITAL_COMMUNITY): Payer: Self-pay

## 2024-05-16 NOTE — Telephone Encounter (Signed)
 Hayden [name] has tested positive for Covid.  Hayden [name], pt's wife, answered the phone.  Educated Hayden [name] that it will have to be 10 days from the date [name] tested positive and he will have to be symptom-free in order to return.  Depending on symptoms, possibly able to return after 05/24/24.

## 2024-05-17 ENCOUNTER — Other Ambulatory Visit: Payer: Self-pay

## 2024-05-17 ENCOUNTER — Emergency Department (HOSPITAL_BASED_OUTPATIENT_CLINIC_OR_DEPARTMENT_OTHER)

## 2024-05-17 ENCOUNTER — Inpatient Hospital Stay (HOSPITAL_COMMUNITY)

## 2024-05-17 ENCOUNTER — Inpatient Hospital Stay (HOSPITAL_BASED_OUTPATIENT_CLINIC_OR_DEPARTMENT_OTHER)
Admission: EM | Admit: 2024-05-17 | Discharge: 2024-05-30 | DRG: 871 | Disposition: E | Source: Ambulatory Visit | Attending: Internal Medicine | Admitting: Internal Medicine

## 2024-05-17 ENCOUNTER — Encounter (HOSPITAL_BASED_OUTPATIENT_CLINIC_OR_DEPARTMENT_OTHER): Payer: Self-pay | Admitting: Emergency Medicine

## 2024-05-17 DIAGNOSIS — I10 Essential (primary) hypertension: Secondary | ICD-10-CM

## 2024-05-17 DIAGNOSIS — Z881 Allergy status to other antibiotic agents status: Secondary | ICD-10-CM

## 2024-05-17 DIAGNOSIS — E119 Type 2 diabetes mellitus without complications: Secondary | ICD-10-CM | POA: Diagnosis present

## 2024-05-17 DIAGNOSIS — I498 Other specified cardiac arrhythmias: Secondary | ICD-10-CM | POA: Diagnosis present

## 2024-05-17 DIAGNOSIS — Z515 Encounter for palliative care: Secondary | ICD-10-CM | POA: Diagnosis not present

## 2024-05-17 DIAGNOSIS — Z955 Presence of coronary angioplasty implant and graft: Secondary | ICD-10-CM | POA: Diagnosis not present

## 2024-05-17 DIAGNOSIS — I4729 Other ventricular tachycardia: Secondary | ICD-10-CM | POA: Diagnosis present

## 2024-05-17 DIAGNOSIS — I472 Ventricular tachycardia, unspecified: Secondary | ICD-10-CM | POA: Diagnosis present

## 2024-05-17 DIAGNOSIS — I252 Old myocardial infarction: Secondary | ICD-10-CM | POA: Diagnosis not present

## 2024-05-17 DIAGNOSIS — Z880 Allergy status to penicillin: Secondary | ICD-10-CM

## 2024-05-17 DIAGNOSIS — Z7984 Long term (current) use of oral hypoglycemic drugs: Secondary | ICD-10-CM | POA: Diagnosis not present

## 2024-05-17 DIAGNOSIS — U071 COVID-19: Secondary | ICD-10-CM

## 2024-05-17 DIAGNOSIS — Z7902 Long term (current) use of antithrombotics/antiplatelets: Secondary | ICD-10-CM | POA: Diagnosis not present

## 2024-05-17 DIAGNOSIS — I5043 Acute on chronic combined systolic (congestive) and diastolic (congestive) heart failure: Secondary | ICD-10-CM | POA: Diagnosis not present

## 2024-05-17 DIAGNOSIS — J1282 Pneumonia due to coronavirus disease 2019: Secondary | ICD-10-CM

## 2024-05-17 DIAGNOSIS — Z66 Do not resuscitate: Secondary | ICD-10-CM | POA: Diagnosis present

## 2024-05-17 DIAGNOSIS — A4189 Other specified sepsis: Secondary | ICD-10-CM | POA: Diagnosis not present

## 2024-05-17 DIAGNOSIS — Z7982 Long term (current) use of aspirin: Secondary | ICD-10-CM | POA: Diagnosis not present

## 2024-05-17 DIAGNOSIS — Z888 Allergy status to other drugs, medicaments and biological substances status: Secondary | ICD-10-CM

## 2024-05-17 DIAGNOSIS — I2510 Atherosclerotic heart disease of native coronary artery without angina pectoris: Secondary | ICD-10-CM | POA: Diagnosis not present

## 2024-05-17 DIAGNOSIS — R579 Shock, unspecified: Secondary | ICD-10-CM

## 2024-05-17 DIAGNOSIS — E785 Hyperlipidemia, unspecified: Secondary | ICD-10-CM

## 2024-05-17 DIAGNOSIS — J9601 Acute respiratory failure with hypoxia: Secondary | ICD-10-CM

## 2024-05-17 DIAGNOSIS — E039 Hypothyroidism, unspecified: Secondary | ICD-10-CM | POA: Diagnosis present

## 2024-05-17 DIAGNOSIS — I110 Hypertensive heart disease with heart failure: Secondary | ICD-10-CM | POA: Diagnosis not present

## 2024-05-17 DIAGNOSIS — Z8249 Family history of ischemic heart disease and other diseases of the circulatory system: Secondary | ICD-10-CM | POA: Diagnosis not present

## 2024-05-17 DIAGNOSIS — Z96651 Presence of right artificial knee joint: Secondary | ICD-10-CM | POA: Diagnosis present

## 2024-05-17 DIAGNOSIS — Z79890 Hormone replacement therapy (postmenopausal): Secondary | ICD-10-CM

## 2024-05-17 DIAGNOSIS — R404 Transient alteration of awareness: Secondary | ICD-10-CM | POA: Diagnosis not present

## 2024-05-17 DIAGNOSIS — I509 Heart failure, unspecified: Secondary | ICD-10-CM

## 2024-05-17 DIAGNOSIS — A419 Sepsis, unspecified organism: Secondary | ICD-10-CM | POA: Diagnosis not present

## 2024-05-17 DIAGNOSIS — R918 Other nonspecific abnormal finding of lung field: Secondary | ICD-10-CM | POA: Diagnosis not present

## 2024-05-17 DIAGNOSIS — R6521 Severe sepsis with septic shock: Secondary | ICD-10-CM | POA: Diagnosis not present

## 2024-05-17 DIAGNOSIS — Z79899 Other long term (current) drug therapy: Secondary | ICD-10-CM

## 2024-05-17 DIAGNOSIS — R6520 Severe sepsis without septic shock: Secondary | ICD-10-CM | POA: Diagnosis present

## 2024-05-17 DIAGNOSIS — R531 Weakness: Secondary | ICD-10-CM | POA: Diagnosis not present

## 2024-05-17 DIAGNOSIS — Z951 Presence of aortocoronary bypass graft: Secondary | ICD-10-CM | POA: Diagnosis not present

## 2024-05-17 DIAGNOSIS — Z743 Need for continuous supervision: Secondary | ICD-10-CM | POA: Diagnosis not present

## 2024-05-17 DIAGNOSIS — R0602 Shortness of breath: Secondary | ICD-10-CM | POA: Diagnosis not present

## 2024-05-17 DIAGNOSIS — R000 Tachycardia, unspecified: Secondary | ICD-10-CM | POA: Diagnosis not present

## 2024-05-17 DIAGNOSIS — Z882 Allergy status to sulfonamides status: Secondary | ICD-10-CM | POA: Diagnosis not present

## 2024-05-17 DIAGNOSIS — H9193 Unspecified hearing loss, bilateral: Secondary | ICD-10-CM | POA: Diagnosis present

## 2024-05-17 DIAGNOSIS — Z4682 Encounter for fitting and adjustment of non-vascular catheter: Secondary | ICD-10-CM | POA: Diagnosis not present

## 2024-05-17 LAB — POCT I-STAT 7, (LYTES, BLD GAS, ICA,H+H)
Acid-base deficit: 7 mmol/L — ABNORMAL HIGH (ref 0.0–2.0)
Bicarbonate: 17.9 mmol/L — ABNORMAL LOW (ref 20.0–28.0)
Calcium, Ion: 1.15 mmol/L (ref 1.15–1.40)
HCT: 37 % — ABNORMAL LOW (ref 39.0–52.0)
Hemoglobin: 12.6 g/dL — ABNORMAL LOW (ref 13.0–17.0)
O2 Saturation: 98 %
Patient temperature: 38.3
Potassium: 3 mmol/L — ABNORMAL LOW (ref 3.5–5.1)
Sodium: 123 mmol/L — ABNORMAL LOW (ref 135–145)
TCO2: 19 mmol/L — ABNORMAL LOW (ref 22–32)
pCO2 arterial: 35.2 mmHg (ref 32–48)
pH, Arterial: 7.32 — ABNORMAL LOW (ref 7.35–7.45)
pO2, Arterial: 120 mmHg — ABNORMAL HIGH (ref 83–108)

## 2024-05-17 LAB — CBC WITH DIFFERENTIAL/PLATELET
Abs Immature Granulocytes: 0.14 K/uL — ABNORMAL HIGH (ref 0.00–0.07)
Basophils Absolute: 0.1 K/uL (ref 0.0–0.1)
Basophils Relative: 1 %
Eosinophils Absolute: 0 K/uL (ref 0.0–0.5)
Eosinophils Relative: 0 %
HCT: 46.4 % (ref 39.0–52.0)
Hemoglobin: 16.4 g/dL (ref 13.0–17.0)
Immature Granulocytes: 1 %
Lymphocytes Relative: 5 %
Lymphs Abs: 0.5 K/uL — ABNORMAL LOW (ref 0.7–4.0)
MCH: 31.9 pg (ref 26.0–34.0)
MCHC: 35.3 g/dL (ref 30.0–36.0)
MCV: 90.3 fL (ref 80.0–100.0)
Monocytes Absolute: 0.3 K/uL (ref 0.1–1.0)
Monocytes Relative: 3 %
Neutro Abs: 9.9 K/uL — ABNORMAL HIGH (ref 1.7–7.7)
Neutrophils Relative %: 90 %
Platelets: 175 K/uL (ref 150–400)
RBC: 5.14 MIL/uL (ref 4.22–5.81)
RDW: 12.4 % (ref 11.5–15.5)
WBC: 11 K/uL — ABNORMAL HIGH (ref 4.0–10.5)
nRBC: 0 % (ref 0.0–0.2)

## 2024-05-17 LAB — I-STAT ARTERIAL BLOOD GAS, ED
Acid-base deficit: 9 mmol/L — ABNORMAL HIGH (ref 0.0–2.0)
Bicarbonate: 20.7 mmol/L (ref 20.0–28.0)
Calcium, Ion: 1.22 mmol/L (ref 1.15–1.40)
HCT: 43 % (ref 39.0–52.0)
Hemoglobin: 14.6 g/dL (ref 13.0–17.0)
O2 Saturation: 92 %
Patient temperature: 104.6
Potassium: 3.5 mmol/L (ref 3.5–5.1)
Sodium: 124 mmol/L — ABNORMAL LOW (ref 135–145)
TCO2: 22 mmol/L (ref 22–32)
pCO2 arterial: 67.2 mmHg (ref 32–48)
pH, Arterial: 7.117 — CL (ref 7.35–7.45)
pO2, Arterial: 102 mmHg (ref 83–108)

## 2024-05-17 LAB — CBC
HCT: 36.4 % — ABNORMAL LOW (ref 39.0–52.0)
Hemoglobin: 13 g/dL (ref 13.0–17.0)
MCH: 31.7 pg (ref 26.0–34.0)
MCHC: 35.7 g/dL (ref 30.0–36.0)
MCV: 88.8 fL (ref 80.0–100.0)
Platelets: 141 K/uL — ABNORMAL LOW (ref 150–400)
RBC: 4.1 MIL/uL — ABNORMAL LOW (ref 4.22–5.81)
RDW: 12.4 % (ref 11.5–15.5)
WBC: 8 K/uL (ref 4.0–10.5)
nRBC: 0 % (ref 0.0–0.2)

## 2024-05-17 LAB — TSH: TSH: 3.015 u[IU]/mL (ref 0.350–4.500)

## 2024-05-17 LAB — CREATININE, SERUM
Creatinine, Ser: 1.37 mg/dL — ABNORMAL HIGH (ref 0.61–1.24)
GFR, Estimated: 53 mL/min — ABNORMAL LOW (ref >60)

## 2024-05-17 LAB — HEMOGLOBIN A1C
Hgb A1c MFr Bld: 6.6 % — ABNORMAL HIGH (ref 4.8–5.6)
Mean Plasma Glucose: 142.72 mg/dL

## 2024-05-17 LAB — LIPID PANEL
Cholesterol: 42 mg/dL (ref 0–200)
HDL: <10 mg/dL — ABNORMAL LOW (ref >40)
Triglycerides: 114 mg/dL (ref <150)
VLDL: 23 mg/dL (ref 0–40)

## 2024-05-17 LAB — LACTIC ACID, PLASMA
Lactic Acid, Venous: 6.6 mmol/L (ref 0.5–1.9)
Lactic Acid, Venous: 2.1 mmol/L (ref 0.5–1.9)

## 2024-05-17 LAB — BRAIN NATRIURETIC PEPTIDE: B Natriuretic Peptide: 632.6 pg/mL — ABNORMAL HIGH (ref 0.0–100.0)

## 2024-05-17 LAB — T4, FREE: Free T4: 1 ng/dL (ref 0.61–1.12)

## 2024-05-17 LAB — PROCALCITONIN: Procalcitonin: 4.59 ng/mL

## 2024-05-17 LAB — MRSA NEXT GEN BY PCR, NASAL

## 2024-05-17 LAB — TROPONIN I (HIGH SENSITIVITY): Troponin I (High Sensitivity): 261 ng/L (ref <18)

## 2024-05-17 LAB — PROTIME-INR
INR: 1.1 (ref 0.8–1.2)
Prothrombin Time: 14.8 s (ref 11.4–15.2)

## 2024-05-17 LAB — CG4 I-STAT (LACTIC ACID): Lactic Acid, Venous: 1.9 mmol/L (ref 0.5–1.9)

## 2024-05-17 MED ORDER — LACTATED RINGERS IV BOLUS (SEPSIS)
1000.0000 mL | Freq: Once | INTRAVENOUS | Status: AC
  Administered 2024-05-17: 1000 mL via INTRAVENOUS

## 2024-05-17 MED ORDER — ACETAMINOPHEN 160 MG/5ML PO SOLN: 650.0000 mg | ORAL | Status: DC | PRN

## 2024-05-17 MED ORDER — FENTANYL 2500MCG IN NS 250ML (10MCG/ML) PREMIX INFUSION
0.0000 ug/h | INTRAVENOUS | Status: DC
  Administered 2024-05-17: 50 ug/h via INTRAVENOUS

## 2024-05-17 MED ORDER — MAGNESIUM SULFATE 2 GM/50ML IV SOLN
INTRAVENOUS | Status: AC
  Filled 2024-05-17: qty 50

## 2024-05-17 MED ORDER — LIDOCAINE HCL (CARDIAC) PF 100 MG/5ML IV SOSY
50.0000 mg | PREFILLED_SYRINGE | Freq: Once | INTRAVENOUS | Status: AC
  Administered 2024-05-17: 50 mg via INTRAVENOUS

## 2024-05-17 MED ORDER — ORAL CARE MOUTH RINSE: 15.0000 mL | OROMUCOSAL | Status: DC

## 2024-05-17 MED ORDER — AMIODARONE HCL IN DEXTROSE 360-4.14 MG/200ML-% IV SOLN: 30.0000 mg/h | INTRAVENOUS | Status: DC

## 2024-05-17 MED ORDER — NOREPINEPHRINE 4 MG/250ML-% IV SOLN
0.0000 ug/min | INTRAVENOUS | Status: DC
  Administered 2024-05-17: 20 ug/min via INTRAVENOUS

## 2024-05-17 MED ORDER — CLOPIDOGREL BISULFATE 75 MG PO TABS
75.0000 mg | ORAL_TABLET | Freq: Every day | ORAL | Status: DC
  Filled 2024-05-17: qty 1

## 2024-05-17 MED ORDER — FENTANYL BOLUS VIA INFUSION: 100.0000 ug | INTRAVENOUS | Status: DC | PRN

## 2024-05-17 MED ORDER — ACETAMINOPHEN 325 MG PO TABS: 650.0000 mg | ORAL_TABLET | ORAL | Status: DC

## 2024-05-17 MED ORDER — AMIODARONE LOAD VIA INFUSION
150.0000 mg | Freq: Once | INTRAVENOUS | Status: AC
Start: 2024-05-17 — End: 2024-05-17
  Filled 2024-05-17: qty 83.34

## 2024-05-17 MED ORDER — AMIODARONE HCL IN DEXTROSE 360-4.14 MG/200ML-% IV SOLN
INTRAVENOUS | Status: AC
  Administered 2024-05-17: 60 mg/h
  Filled 2024-05-17: qty 200

## 2024-05-17 MED ORDER — SODIUM CHLORIDE 0.9 % IV SOLN
100.0000 mg | Freq: Two times a day (BID) | INTRAVENOUS | Status: DC
  Filled 2024-05-17: qty 100

## 2024-05-17 MED ORDER — FENTANYL 2500MCG IN NS 250ML (10MCG/ML) PREMIX INFUSION
INTRAVENOUS | Status: AC
  Administered 2024-05-17: 50 ug/h
  Filled 2024-05-17: qty 250

## 2024-05-17 MED ORDER — ACETAMINOPHEN 650 MG RE SUPP: 650.0000 mg | RECTAL | Status: DC

## 2024-05-17 MED ORDER — BUSPIRONE HCL 10 MG PO TABS: 30.0000 mg | ORAL_TABLET | Freq: Three times a day (TID) | ORAL | Status: DC | PRN

## 2024-05-17 MED ORDER — EPINEPHRINE HCL 5 MG/250ML IV SOLN IN NS
INTRAVENOUS | Status: AC
  Filled 2024-05-17: qty 250

## 2024-05-17 MED ORDER — FENTANYL CITRATE PF 50 MCG/ML IJ SOSY
50.0000 ug | PREFILLED_SYRINGE | INTRAMUSCULAR | Status: AC | PRN
  Administered 2024-05-17: 50 ug via INTRAVENOUS
  Administered 2024-05-17 (×2): 25 ug via INTRAVENOUS
  Filled 2024-05-17 (×2): qty 1

## 2024-05-17 MED ORDER — MAGNESIUM SULFATE 50 % IJ SOLN
1.0000 g | Freq: Once | INTRAMUSCULAR | Status: AC
  Administered 2024-05-17: 1 g via INTRAVENOUS

## 2024-05-17 MED ORDER — AMIODARONE HCL IN DEXTROSE 360-4.14 MG/200ML-% IV SOLN: 60.0000 mg/h | INTRAVENOUS | Status: DC

## 2024-05-17 MED ORDER — ACETAMINOPHEN 650 MG RE SUPP
650.0000 mg | Freq: Once | RECTAL | Status: AC
  Administered 2024-05-17: 650 mg via RECTAL
  Filled 2024-05-17: qty 1

## 2024-05-17 MED ORDER — FAMOTIDINE 20 MG PO TABS
20.0000 mg | ORAL_TABLET | Freq: Two times a day (BID) | ORAL | Status: DC
  Filled 2024-05-17: qty 1

## 2024-05-17 MED ORDER — ACETAMINOPHEN 650 MG RE SUPP: 650.0000 mg | Freq: Four times a day (QID) | RECTAL | Status: DC | PRN

## 2024-05-17 MED ORDER — HYDROCORTISONE SOD SUC (PF) 100 MG IJ SOLR: 100.0000 mg | Freq: Three times a day (TID) | INTRAMUSCULAR | Status: DC

## 2024-05-17 MED ORDER — ORAL CARE MOUTH RINSE: 15.0000 mL | OROMUCOSAL | Status: DC | PRN

## 2024-05-17 MED ORDER — AMIODARONE HCL 150 MG/3ML IV SOLN
INTRAVENOUS | Status: AC | PRN
  Administered 2024-05-17: 300 mg via INTRAVENOUS

## 2024-05-17 MED ORDER — LIDOCAINE HCL (CARDIAC) PF 100 MG/5ML IV SOSY
PREFILLED_SYRINGE | INTRAVENOUS | Status: AC
  Filled 2024-05-17: qty 5

## 2024-05-17 MED ORDER — SODIUM CHLORIDE 0.9 % IV SOLN: INTRAVENOUS | Status: DC

## 2024-05-17 MED ORDER — VANCOMYCIN HCL 500 MG/100ML IV SOLN
500.0000 mg | Freq: Once | INTRAVENOUS | Status: DC
  Filled 2024-05-17 (×2): qty 100

## 2024-05-17 MED ORDER — GLYCOPYRROLATE 0.2 MG/ML IJ SOLN: 0.2000 mg | INTRAMUSCULAR | Status: DC | PRN

## 2024-05-17 MED ORDER — DOCUSATE SODIUM 50 MG/5ML PO LIQD
100.0000 mg | Freq: Two times a day (BID) | ORAL | Status: DC
  Filled 2024-05-17: qty 10

## 2024-05-17 MED ORDER — VASOPRESSIN 20 UNITS/100 ML INFUSION FOR SHOCK
0.0000 [IU]/min | INTRAVENOUS | Status: DC
  Administered 2024-05-17: 0.03 [IU]/min via INTRAVENOUS
  Filled 2024-05-17: qty 100

## 2024-05-17 MED ORDER — VANCOMYCIN HCL IN DEXTROSE 1-5 GM/200ML-% IV SOLN
1000.0000 mg | Freq: Once | INTRAVENOUS | Status: AC
  Administered 2024-05-17: 1000 mg via INTRAVENOUS
  Filled 2024-05-17: qty 200

## 2024-05-17 MED ORDER — LIDOCAINE IN D5W 4-5 MG/ML-% IV SOLN
1.0000 mg/min | INTRAVENOUS | Status: DC
  Administered 2024-05-17: 1 mg/min via INTRAVENOUS
  Filled 2024-05-17 (×2): qty 500

## 2024-05-17 MED ORDER — FENTANYL CITRATE PF 50 MCG/ML IJ SOSY: 50.0000 ug | PREFILLED_SYRINGE | INTRAMUSCULAR | Status: DC | PRN

## 2024-05-17 MED ORDER — AMIODARONE HCL IN DEXTROSE 360-4.14 MG/200ML-% IV SOLN
30.0000 mg/h | INTRAVENOUS | Status: DC
Start: 2024-05-17 — End: 2024-05-17

## 2024-05-17 MED ORDER — ASPIRIN 81 MG PO CHEW
81.0000 mg | CHEWABLE_TABLET | Freq: Every day | ORAL | Status: DC
  Filled 2024-05-17: qty 1

## 2024-05-17 MED ORDER — ACETAMINOPHEN 325 MG PO TABS: 650.0000 mg | ORAL_TABLET | ORAL | Status: DC | PRN

## 2024-05-17 MED ORDER — LACTATED RINGERS IV BOLUS (SEPSIS): 250.0000 mL | Freq: Once | INTRAVENOUS | Status: DC

## 2024-05-17 MED ORDER — AMIODARONE IV BOLUS ONLY 150 MG/100ML
INTRAVENOUS | Status: AC | PRN
  Administered 2024-05-17: 150 mg via INTRAVENOUS

## 2024-05-17 MED ORDER — POLYVINYL ALCOHOL 1.4 % OP SOLN: 1.0000 [drp] | Freq: Four times a day (QID) | OPHTHALMIC | Status: DC | PRN

## 2024-05-17 MED ORDER — LIDOCAINE IN D5W 4-5 MG/ML-% IV SOLN
1.0000 mg/min | INTRAVENOUS | Status: DC
  Filled 2024-05-17: qty 500

## 2024-05-17 MED ORDER — ACETAMINOPHEN 160 MG/5ML PO SOLN
650.0000 mg | ORAL | Status: DC
  Filled 2024-05-17: qty 20.3

## 2024-05-17 MED ORDER — AMIODARONE HCL IN DEXTROSE 360-4.14 MG/200ML-% IV SOLN
60.0000 mg/h | INTRAVENOUS | Status: AC
  Administered 2024-05-17: 60 mg/h via INTRAVENOUS
  Filled 2024-05-17: qty 200

## 2024-05-17 MED ORDER — CHLORHEXIDINE GLUCONATE CLOTH 2 % EX PADS: 6.0000 | MEDICATED_PAD | Freq: Every day | CUTANEOUS | Status: DC

## 2024-05-17 MED ORDER — LIDOCAINE IN D5W 4-5 MG/ML-% IV SOLN
INTRAVENOUS | Status: AC
  Filled 2024-05-17: qty 500

## 2024-05-17 MED ORDER — DOCUSATE SODIUM 50 MG/5ML PO LIQD: 100.0000 mg | Freq: Two times a day (BID) | ORAL | Status: DC | PRN

## 2024-05-17 MED ORDER — FENTANYL 2500MCG IN NS 250ML (10MCG/ML) PREMIX INFUSION: 0.0000 ug/h | INTRAVENOUS | Status: DC

## 2024-05-17 MED ORDER — PROPOFOL 1000 MG/100ML IV EMUL
0.0000 ug/kg/min | INTRAVENOUS | Status: DC
  Administered 2024-05-17: 10 ug/kg/min via INTRAVENOUS
  Filled 2024-05-17: qty 100

## 2024-05-17 MED ORDER — EPINEPHRINE 1 MG/10ML IV SOSY
PREFILLED_SYRINGE | INTRAVENOUS | Status: AC | PRN
  Administered 2024-05-17 (×4): 1 mg via INTRAVENOUS

## 2024-05-17 MED ORDER — KETAMINE HCL 10 MG/ML IJ SOLN
1.0000 mg/kg | Freq: Once | INTRAMUSCULAR | Status: AC
  Administered 2024-05-17: 75 mg via INTRAVENOUS
  Filled 2024-05-17: qty 1

## 2024-05-17 MED ORDER — SODIUM BICARBONATE 8.4 % IV SOLN
INTRAVENOUS | Status: AC
  Filled 2024-05-17: qty 100

## 2024-05-17 MED ORDER — ACETAMINOPHEN 325 MG PO TABS: 650.0000 mg | ORAL_TABLET | Freq: Four times a day (QID) | ORAL | Status: DC | PRN

## 2024-05-17 MED ORDER — EPINEPHRINE 1 MG/10ML IV SOSY
PREFILLED_SYRINGE | INTRAVENOUS | Status: AC
  Filled 2024-05-17: qty 40

## 2024-05-17 MED ORDER — DEXAMETHASONE SODIUM PHOSPHATE 10 MG/ML IJ SOLN: 6.0000 mg | INTRAMUSCULAR | Status: DC

## 2024-05-17 MED ORDER — ACETAMINOPHEN 650 MG RE SUPP: 650.0000 mg | RECTAL | Status: DC | PRN

## 2024-05-17 MED ORDER — INSULIN ASPART 100 UNIT/ML IJ SOLN: 0.0000 [IU] | INTRAMUSCULAR | Status: DC

## 2024-05-17 MED ORDER — PHENYLEPHRINE 80 MCG/ML (10ML) SYRINGE FOR IV PUSH (FOR BLOOD PRESSURE SUPPORT)
PREFILLED_SYRINGE | INTRAVENOUS | Status: AC
  Filled 2024-05-17: qty 10

## 2024-05-17 MED ORDER — SODIUM BICARBONATE 8.4 % IV SOLN
INTRAVENOUS | Status: AC | PRN
  Administered 2024-05-17: 50 meq via INTRAVENOUS

## 2024-05-17 MED ORDER — MAGNESIUM SULFATE 50 % IJ SOLN
1.0000 g | Freq: Once | INTRAMUSCULAR | Status: AC
  Administered 2024-05-17: 1 g via INTRAVENOUS
  Filled 2024-05-17: qty 2

## 2024-05-17 MED ORDER — MAGNESIUM SULFATE 50 % IJ SOLN
INTRAMUSCULAR | Status: AC | PRN
  Administered 2024-05-17: 1 g via INTRAVENOUS

## 2024-05-17 MED ORDER — AMIODARONE HCL IN DEXTROSE 360-4.14 MG/200ML-% IV SOLN
INTRAVENOUS | Status: AC
  Administered 2024-05-17: 150 mg via INTRAVENOUS
  Filled 2024-05-17: qty 200

## 2024-05-17 MED ORDER — HEPARIN SODIUM (PORCINE) 5000 UNIT/ML IJ SOLN
5000.0000 [IU] | Freq: Three times a day (TID) | INTRAMUSCULAR | Status: DC
  Filled 2024-05-17: qty 1

## 2024-05-17 MED ORDER — DEXMEDETOMIDINE HCL IN NACL 400 MCG/100ML IV SOLN
0.0000 ug/kg/h | INTRAVENOUS | Status: DC
  Filled 2024-05-17: qty 100

## 2024-05-17 MED ORDER — LIDOCAINE HCL (CARDIAC) PF 100 MG/5ML IV SOSY
100.0000 mg | PREFILLED_SYRINGE | Freq: Once | INTRAVENOUS | Status: AC
  Administered 2024-05-17: 100 mg via INTRAVENOUS

## 2024-05-17 MED ORDER — POLYETHYLENE GLYCOL 3350 17 G PO PACK: 17.0000 g | PACK | Freq: Every day | ORAL | Status: DC | PRN

## 2024-05-17 MED ORDER — POLYETHYLENE GLYCOL 3350 17 G PO PACK
17.0000 g | PACK | Freq: Every day | ORAL | Status: DC
  Filled 2024-05-17: qty 1

## 2024-05-17 MED ORDER — GLYCOPYRROLATE 1 MG PO TABS: 1.0000 mg | ORAL_TABLET | ORAL | Status: DC | PRN

## 2024-05-17 MED ORDER — LACTATED RINGERS IV SOLN: INTRAVENOUS | Status: DC

## 2024-05-17 MED ORDER — AMIODARONE IV BOLUS ONLY 150 MG/100ML
INTRAVENOUS | Status: AC
  Administered 2024-05-17: 150 mg
  Filled 2024-05-17: qty 100

## 2024-05-17 MED ORDER — ROCURONIUM BROMIDE 10 MG/ML (PF) SYRINGE
75.0000 mg | PREFILLED_SYRINGE | Freq: Once | INTRAVENOUS | Status: AC
  Administered 2024-05-17: 75 mg via INTRAVENOUS
  Filled 2024-05-17: qty 10

## 2024-05-17 MED ORDER — MAGNESIUM SULFATE 2 GM/50ML IV SOLN: 2.0000 g | Freq: Once | INTRAVENOUS | Status: DC | PRN

## 2024-05-17 MED ORDER — SODIUM CHLORIDE 0.9 % IV SOLN
2.0000 g | Freq: Once | INTRAVENOUS | Status: AC
  Administered 2024-05-17: 2 g via INTRAVENOUS
  Filled 2024-05-17: qty 12.5

## 2024-05-18 ENCOUNTER — Other Ambulatory Visit (HOSPITAL_BASED_OUTPATIENT_CLINIC_OR_DEPARTMENT_OTHER): Payer: Self-pay

## 2024-05-18 ENCOUNTER — Encounter (HOSPITAL_COMMUNITY)

## 2024-05-21 ENCOUNTER — Encounter (HOSPITAL_COMMUNITY)

## 2024-05-22 LAB — CULTURE, BLOOD (ROUTINE X 2)
Culture: NO GROWTH
Culture: NO GROWTH
Special Requests: ADEQUATE
Special Requests: ADEQUATE

## 2024-05-23 ENCOUNTER — Encounter (HOSPITAL_COMMUNITY)

## 2024-05-25 ENCOUNTER — Encounter (HOSPITAL_COMMUNITY)

## 2024-05-28 ENCOUNTER — Encounter (HOSPITAL_COMMUNITY)

## 2024-05-28 MED FILL — Fentanyl Citrate Preservative Free (PF) Inj 100 MCG/2ML: INTRAMUSCULAR | Qty: 2 | Status: AC

## 2024-05-30 ENCOUNTER — Encounter (HOSPITAL_COMMUNITY)

## 2024-05-30 NOTE — Progress Notes (Signed)
 ED Pharmacy Antibiotic Sign Off An antibiotic consult was received from an ED provider for vancomycin  and cefepime  per pharmacy dosing for sepsis. A chart review was completed to assess appropriateness.   The following one time order(s) were placed:  Cefepime  2g IV x 1 Vanc 1g IV x 1  Further antibiotic and/or antibiotic pharmacy consults should be ordered by the admitting provider if indicated.   Thank you for allowing pharmacy to be a part of this patient's care.   Anjelita Sheahan, Vernell Helling, [facility]  Clinical Pharmacist 31-May-2024 1:55 PM

## 2024-05-30 NOTE — Sepsis Progress Note (Signed)
 Orders for antibiotic placed by PA

## 2024-05-30 NOTE — Progress Notes (Signed)
   2024/06/03 2000  Spiritual Encounters  Type of Visit Initial  Care provided to: [facility] partners present during encounter Nurse  Referral source Nurse (RN/NT/LPN)  Reason for visit Patient death  OnCall Visit Yes   Chaplain was paged for family support. The family was at the bedside. Their Juliene was present. Chaplain was compassionately present and provided support.    M.Kubra Susanna Kerry Resident [phone]

## 2024-05-30 NOTE — ED Notes (Signed)
 Report given to ICU RN. Transport called for assist to floor.

## 2024-05-30 NOTE — H&P (Signed)
 NAME:  Hayden [name], MRN:  [id number], DOB:  Aug 27, 1946, LOS: 0 ADMISSION DATE:  06/10/2024, CONSULTATION DATE:  10-Jun-2024 REFERRING MD:  Roxie Bough, EDP CHIEF COMPLAINT:  ventricular tachycardia    History of Present Illness:  78 year old male with past medical history of aortic root dilation, CHF, CAD s/p CABG x3 in 2000, recent stent placement in cardiac rehab, hyperlipidemia, hypertension, diabetes who presented to [facility] emergency department with shortness of breath. Per son at bedside, patient had recently come back from [facility] on Sunday 9/14 feeling unwell. He took home COVID test which was positive. Throughout the week has felt poorly and wife went for walk this morning when patient called wife stating he fell getting off the toilet. They presented to [facility] ED and he was found to be hypoxic and in VT storm. He was give lidocaine  bolus x2 and was started on amiodarone  drip and was transferred to [facility] for cardiology evaluation. [facility] was [facility]ated at [facility] for his hypoxia. Labs hemolyzed at [facility]; however, lactic is 6.6, WBC 11.0, ABG 7.117, pCO2 67.2, bicarb 20. CXR with L>R pneumonia. In ED he is in sustained VT with a pulse. He is on amio, propofol  drip. Receiving vancomycin  and cefepime . On chart review he was admitting 7/2 and received arthrectomy of distal LM and proximal and mid CX, stenting to ostium and distal LM. On aspirin  and Plavix .   Pertinent  Medical History  aortic root dilation, CHF, CAD s/p CABG x3 in 2000, recent stent placement in cardiac rehab, hyperlipidemia, hypertension  Significant Hospital Events: Including procedures, antibiotic start and stop dates in addition to other pertinent events   June 10, 2024: to [facility] ED with hypoxia, VT storm, intubated. Transfer to [facility] admitted to ICU  Interim History / Subjective:  Patient unable to participate in subjective portion of the exam. Son at bedside, Dale able to provide history. He also consents to full code at this time.   Objective    Blood pressure (!) 165/113, pulse (!) 186, temperature (!) 97.5 F (36.4 C), temperature source Oral, resp. rate 19, height 5' 7.5 (1.715 m), weight 74.7 kg, SpO2 99%.    Vent Mode: PRVC FiO2 (%):  [100 %] 100 % Set Rate:  [18 bmp] 18 bmp Vt Set:  [450 mL] 450 mL PEEP:  [5 cmH20] 5 cmH20  No intake or output data in the 24 hours ending 2024-06-10 1504 Filed Weights   06-10-24 1236  Weight: 74.7 kg    Examination: General: elderly male, critically ill appearing, laying in bed  HENT: perrla, mm dry, ett, ogt  Lungs: rhonchi L>R, vented, synchronous  Cardiovascular: VT with good radial, carotid pulses, no peripheral edema  Abdomen: rounded, soft  Extremities: no edema Neuro: sedated GU: foley  Resolved Hospital Problem list     Assessment & Plan:  Ventricular tachycardia with pulse  Found to have VT storm with pulse at [facility] ED. Initially tried lidocaine  pushes followed by amio gtt which has not controlled storm.  - AHF consulted, appreciate management  - will be cardioverted on arrival to 2H  - may need invasive cvc, aline depending on cardioversion - not currently in shock  - add lidocaine  gtt  - con't amio gtt  - check elytes, mag, TSH/T4  - check trop, BNP    Acute hypoxic respiratory failure 2/2 COVID pneumonia  Home test positive 9/14. CXR with L>R infiltrates. Hypoxic on arrival to ED and intubated.  - repeat ABG when on unit  - follow up confirmatory  resp swab, MRSA PCR, resp culture, urine legionella and strep  - CXR tomorrow AM  - add on antibiotic coverage for superimposed infection vanc, cefepime , doxy  - dexamethasone  6mg  daily for 10 days  - full mechanical vent support - lung protective ventilation 6-8cc/kg Vt - VAP and PAD bundle in place  - titrate FiO2 to sat goal >92  - maintain peak/plats <30, driving pressures <84    Sepsis 2/2 COVID PNA  Temp 104.4 in the ED, WBC 11. Known COVID +/- superimposed bacterial pneumonia.  - arctic sun/TTM focused  ordered to control fever  - f/u procal, blood culture, resp culture  - MRSA PCR  - urine legionella and strep  - vanc, cefepime , doxy  - trend lactic, wbc, fever curve   HFmrEF  CAD s/p CABG x3 2000 Recent stenting 02/2024 on asa Plavix  Recent history of worsening sob, chest pains. Admitted 7/2 and received arthrectomy of distal LM and proximal and mid CX, stenting to ostium and distal LM. On aspirin  and Plavix . Most recent EF 10/2023 40-45%, RWMA, G1DD, RV mildly reduced, aortic dilation 38mm  - con't ASA, Plavix   - tele monitoring  - optimize elytes  - GDMT as able, AHF on board, appreciate help in management   Hypertension  Hyperlipidemia  - hold antihypertensives until back in sinus rhythm/rhythm control  - not on statin - check lipid panel   Hypothyroidism  On synthroid   - check TSH/T4 in setting of VT storm  - hold synthroid  for now   Diabetes  On metformin , SGLT2 inhibitor at home  - check A1c  - ssi  - cbg q4h    Labs   CBC: Recent Labs  Lab 05/25/2024 1255  WBC 11.0*  NEUTROABS 9.9*  HGB 16.4  HCT 46.4  MCV 90.3  PLT 175    Basic Metabolic Panel: No results for input(s): NA, K, CL, CO2, GLUCOSE, BUN, CREATININE, CALCIUM, MG, PHOS in the last 168 hours. GFR: CrCl cannot be calculated (Patient's most recent lab result is older than the maximum 21 days allowed.). Recent Labs  Lab May 25, 2024 1252 05/25/24 1255  WBC  --  11.0*  LATICACIDVEN 6.6*  --     Liver Function Tests: No results for input(s): AST, ALT, ALKPHOS, BILITOT, PROT, ALBUMIN in the last 168 hours. No results for input(s): LIPASE, AMYLASE in the last 168 hours. No results for input(s): AMMONIA in the last 168 hours.  ABG No results found for: PHART, PCO2ART, PO2ART, HCO3, TCO2, ACIDBASEDEF, O2SAT   Coagulation Profile: Recent Labs  Lab 2024/05/25 1255  INR 1.1    Cardiac Enzymes: No results for input(s): CKTOTAL, CKMB,  CKMBINDEX, TROPONINI in the last 168 hours.  HbA1C: Hgb A1c MFr Bld  Date/Time Value Ref Range Status  09/04/2012 08:08 PM 6.6 (H) <5.7 % Final    Comment:    (NOTE)                                                                       According to the ADA Clinical Practice Recommendations for 2011, when HbA1c is used as a screening test:  >=6.5%   Diagnostic of Diabetes Mellitus           (if abnormal result is confirmed)  5.7-6.4%   Increased risk of developing Diabetes Mellitus References:Diagnosis and Classification of Diabetes Mellitus,Diabetes Care,2011,34(Suppl 1):S62-S69 and Standards of Medical Care in         Diabetes - 2011,Diabetes Care,2011,34 (Suppl 1):S11-S61.    CBG: No results for input(s): GLUCAP in the last 168 hours.  Review of Systems:   As above  Past Medical History:  He,  has a past medical history of Anxiety disorder, Aortic root dilatation (HCC), Arthritis, Bright's disease (age 11), Carotid artery stenosis, Chronic combined systolic (congestive) and diastolic (congestive) heart failure (HCC), Coronary artery disease, Frequent urination, H/O hiatal hernia, Hyperlipidemia, Hypertension, Hypogonadism male, Impaired hearing, Kidney stones, Myocardial infarction (HCC) (10/31/1989), Opiate addiction (HCC), PONV (postoperative nausea and vomiting), and Pulmonary regurgitation.   Surgical History:   Past Surgical History:  Procedure Laterality Date   ANGIOPLASTY  1991   BILATERAL KNEE ARTHROSCOPY  yrs ago   CARDIAC CATHETERIZATION  01-17-07   CORONARY ANGIOPLASTY     CORONARY ARTERY BYPASS GRAFT  [phone]   cabg x 3   CORONARY ATHERECTOMY N/A 02/29/2024   Procedure: CORONARY ATHERECTOMY;  Surgeon: Ladona Heinz, MD;  Location: [facility];  Service: Cardiovascular;  Laterality: N/A;  left main/cfx   CORONARY STENT INTERVENTION N/A 08/17/2023   Procedure: CORONARY STENT INTERVENTION;  Surgeon: Verlin Lonni BIRCH, MD;  Location: [facility] INVAS[facility] CV LAB;   Service: Cardiovascular;  Laterality: N/A;   CORONARY STENT INTERVENTION N/A 02/29/2024   Procedure: CORONARY STENT INTERVENTION;  Surgeon: Ladona Heinz, MD;  Location: [facility];  Service: Cardiovascular;  Laterality: N/A;  left main/CFX   CYSTOSCOPY     10 to 12 times in past   HERNIA REPAIR  1998   with mesh   LEFT HEART CATH AND CORS/GRAFTS ANGIOGRAPHY N/A 08/17/2023   Procedure: LEFT HEART CATH AND CORS/GRAFTS ANGIOGRAPHY;  Surgeon: Verlin Lonni BIRCH, MD;  Location: [facility];  Service: Cardiovascular;  Laterality: N/A;   LEFT HEART CATH AND CORS/GRAFTS ANGIOGRAPHY N/A 02/29/2024   Procedure: LEFT HEART CATH AND CORS/GRAFTS ANGIOGRAPHY;  Surgeon: Ladona Heinz, MD;  Location: [facility];  Service: Cardiovascular;  Laterality: N/A;   lithortripsy     x 3   LUMBAR LAMINECTOMY  L 3 to L 4   Jul 01 1995   NISSEN FUNDOPLICATION  2006   TOTAL KNEE ARTHROPLASTY  10/27/2011   Procedure: TOTAL KNEE ARTHROPLASTY;  Surgeon: Dempsey LULLA Moan, MD;  Location: [facility];  Service: Orthopedics;  Laterality: Right;     Social History:   reports that he has never smoked. He has never used smokeless tobacco. He reports current drug use. He reports that he does not drink alcohol .   Family History:  His family history includes Cancer - Lung in his father and mother; Heart attack in his father and mother.   Allergies Allergies  Allergen Reactions   Celecoxib Other (See Comments)    Per Mar   Penicillins Other (See Comments)    Gi upset    Statins     Myalgias on simvastatin  5mg  2x/week, rosuvastatin, pravastatin, and Vytorin    Sulfa Antibiotics Swelling   Sulfamethoxazole Other (See Comments)    hands and feet web     Home Medications  Prior to Admission medications   Medication Sig Start Date End Date Taking? Authorizing Provider  amLODipine  (NORVASC ) 10 MG tablet Take 1 tablet (10 mg total) by mouth daily. 06/29/23   Shlomo Wilbert SAUNDERS, MD  ascorbic acid (VITAMIN C) 1000  MG tablet Take 500 mg by mouth daily.    [provider]  aspirin  EC 81 MG tablet Take 1 tablet (81 mg total) by mouth daily. Swallow whole. 02/21/24   Lelon Hamilton T, PA-C  Bexagliflozin (BRENZAVVY) 20 MG TABS Take 1 tablet by mouth daily.    [provider]  bisoprolol  (ZEBETA ) 5 MG tablet Take 0.5 tablets (2.5 mg total) by mouth daily. 04/24/24   Ladona Heinz, MD  Cholecalciferol  (VITAMIN D ) 2000 UNITS tablet Take 2,000 Units by mouth daily.    [provider]  clopidogrel  (PLAVIX ) 75 MG tablet Take 1 tablet (75 mg total) by mouth daily. 02/07/24   Ladona Heinz, MD  Coenzyme Q10 (CO Q 10 PO) Take 1 tablet by mouth daily.    [provider]  Evolocumab  (REPATHA  SURECLICK) 140 MG/ML SOAJ Inject 140 mg into the skin every 14 (fourteen) days. 05/16/23   Shlomo Wilbert SAUNDERS, MD  hyoscyamine  (LEVSIN ) 0.125 MG tablet Take 1 tablet (0.125 mg total) by mouth every 4 (four) hours as needed. 09/26/23   Swinyer, Rosaline HERO, NP  levothyroxine  (SYNTHROID ) 75 MCG tablet Take 1 tablet (75 mcg total) by mouth every morning on an empty stomach. 10/17/23     losartan  (COZAAR ) 25 MG tablet Take 1 tablet (25 mg total) by mouth daily. 11/11/23     metFORMIN  (GLUCOPHAGE -XR) 500 MG 24 hr tablet Take 500 mg by mouth 3 (three) times daily. 02/10/22   [provider]  metFORMIN  (GLUCOPHAGE -XR) 500 MG 24 hr tablet Take 3 tablets (1,500 mg total) by mouth every evening with a meal. 03/14/24     molnupiravir  EUA (LAGEVRIO ) 200 MG CAPS capsule Take 4 capsules (800 mg total) by mouth every 12 (twelve) hours. 05/14/24     Multiple Vitamin (MULTIVITAMIN WITH MINERALS) TABS Take 1 tablet by mouth daily.    [provider]  nitrofurantoin , macrocrystal-monohydrate, (MACROBID ) 100 MG capsule Take 1 capsule (100 mg total) by mouth every 12 (twelve) hours. 04/16/24     nitroGLYCERIN  (NITROSTAT ) 0.4 MG SL tablet Place 1 tablet (0.4 mg total) under the tongue every 5 (five) minutes as needed. 08/17/23    Henry Manuelita NOVAK, NP  pantoprazole  (PROTONIX ) 40 MG tablet Take 1 tablet (40 mg total) by mouth daily. 02/07/24 02/06/25  Ladona Heinz, MD  predniSONE  (STERAPRED UNI-PAK 21 TAB) 5 MG (21) TBPK tablet Take as directed Patient not taking: Reported on 03/28/2024 02/23/24        Critical care time: 35    Tinnie FORBES [name] DEVONNA [place] [facility] 06/14/24 3:41 PM  Please see [email or web address] for pager details.  From 7A-7P if no response, please call [phone] After hours, please call ELink [phone]

## 2024-05-30 NOTE — Death Summary Note (Signed)
 DEATH SUMMARY   Patient Details  Name: Hayden [name] MRN: [id number] DOB: [birth year]  Admission/Discharge Information   Admit Date:  05-23-2024  Date of Death:  05/23/2024  Time of Death:   1722-05-27  Length of Stay: 0  Referring Physician: Sun, Vyvyan, MD   Reason(s) for Hospitalization  Ventricular tachycardia   Diagnoses  Preliminary cause of death:  Secondary Diagnoses (including complications and co-morbidities):  Principal Problem:   Ventricular tachycardia, sustained [facility])   Brief Hospital Course (including significant findings, care, treatment, and services provided and events leading to death)  Hayden [name] is a 78 y.o. year old male with past medical history of aortic root dilation, CHF, CAD s/p CABG x3 in 28-May-1999, recent stent placement in cardiac rehab, hyperlipidemia, hypertension, diabetes who presented to [facility] emergency department with shortness of breath. Per son at bedside, patient had recently come back from [facility] on Sunday 9/14 feeling unwell. He took home COVID test which was positive. Throughout the week has felt poorly and wife went for walk this morning when patient called wife stating he fell getting off the toilet. They presented to [facility] ED and he was found to be hypoxic and in VT storm. He was give lidocaine  bolus x2 and was started on amiodarone  drip and was transferred to [facility] for cardiology evaluation. He was intubated at [facility] for his hypoxia. Labs hemolyzed at [facility]; however, lactic is 6.6, WBC 11.0, ABG 7.117, pCO2 67.2, bicarb 20. CXR with L>R pneumonia. In ED he is in sustained VT with a pulse. He is on amio, propofol  drip. Receiving vancomycin  and cefepime . On chart review he was admitting 7/2 and received arthrectomy of distal LM and proximal and mid CX, stenting to ostium and distal LM. On aspirin  and Plavix .   Patient had rapid decompensation after arriving to ICU. Initially, patient had worsening tachycardia >200 requiring synchronized cardioversion. He  required 1 shock with return to sinus tachycardia. PCCM then placed a-line, central line. He had sudden drop in BP to 60s/30s. He was started on levo/vaso gtt which was quickly up titrated. Drs Claudene and Zenaida made aware of escalating pressor requirements. An ABG was obtained which was not acidotic. He then went into TdP which was cardioverted without response. He was treated with amio 150 and 300 bolus, lidocaine , magnesium  bolus, calcium chloride, multiple amps of bicarb. He had refractory TdP despite efforts and multiple cardioversions. He required chest compressions intermittently throughout this time to obtain ROSC. Ultimately unable to maintain ROSC. Dr. Zenaida spoke with family about inability to achieve perfusing rhythm. Decision was made to move to comfort measures. Chaplain was called.   Pertinent Labs and Studies  Significant Diagnostic Studies DG Chest Port 1 View Result Date: 05/23/2024 CLINICAL DATA:  Shortness of breath and weakness. EXAM: PORTABLE CHEST 1 VIEW COMPARISON:  July 15, 2016. FINDINGS: Stable cardiomediastinal silhouette. Endotracheal and nasogastric tubes are in grossly good position. Status post coronary bypass graft. Right lung is clear. Left upper lobe opacity is noted most consistent with pneumonia. Bony thorax is unremarkable. IMPRESSION: Endotracheal and nasogastric tubes are in grossly good position. Left upper lobe opacity is noted most consistent with pneumonia. Electronically Signed   By: Lynwood Landy Raddle M.D.   On: 05-23-24 14:04    Microbiology No results found for this or any previous visit (from the past 240 hours).  Lab Basic Metabolic Panel: Recent Labs  Lab 05/23/2024 1513 05/23/24 1642  NA 124*  --   K 3.5  --  CREATININE  --  1.37*   Liver Function Tests: No results for input(s): AST, ALT, ALKPHOS, BILITOT, PROT, ALBUMIN in the last 168 hours. No results for input(s): LIPASE, AMYLASE in the last 168 hours. No results for  input(s): AMMONIA in the last 168 hours. CBC: Recent Labs  Lab June 06, 2024 1255 Jun 06, 2024 1513 06/06/24 1642  WBC 11.0*  --  8.0  NEUTROABS 9.9*  --   --   HGB 16.4 14.6 13.0  HCT 46.4 43.0 36.4*  MCV 90.3  --  88.8  PLT 175  --  141*   Cardiac Enzymes: No results for input(s): CKTOTAL, CKMB, CKMBINDEX, TROPONINI in the last 168 hours. Sepsis Labs: Recent Labs  Lab 2024-06-06 1252 2024/06/06 1255 06/06/24 1642  WBC  --  11.0* 8.0  LATICACIDVEN 6.6*  --   --     Procedures/Operations  Aline Central line  POCUS Echo  Intubation   Tinnie FORBES Furth, PA-C [place] [facility] 06-06-24 6:07 PM  Please see [email or web address] for pager details.  From 7A-7P if no response, please call [phone] After hours, please call ELink [phone]

## 2024-05-30 NOTE — Procedures (Signed)
 Called to bedside for worsening tachycardia with rates in the 220s. Palpable pulses, but given worsening tachyarrhythmia and earlier VT decision was made to cardiovert. 25mcg of fentanyl  was given, patient on propofol  at 30. Pads were applied and 200J of shock was delivered with successful return of sinus rhythm.

## 2024-05-30 NOTE — Progress Notes (Signed)
 Pt transported from ED10 to 2H03 by RN and RT w/o complications

## 2024-05-30 NOTE — Progress Notes (Signed)
 ED Pharmacy Antibiotic Sign Off An antibiotic consult was received from an ED provider for vancomycin  and cefepime  per pharmacy dosing for sepsis. A chart review was completed to assess appropriateness.   Cefepime  2g and vanco 1g ordered at med center per protocol.  The following one time order(s) were placed:  Give additional 500 mg vancomycin  after 1g dose completed  Further antibiotic and/or antibiotic pharmacy consults should be ordered by the admitting provider if indicated.   Thank you for allowing pharmacy to be a part of this patient's care.   Dorn LITTIE Buttner, [facility]  Clinical Pharmacist 05/22/24 3:13 PM

## 2024-05-30 NOTE — Procedures (Signed)
 Central Venous Catheter Insertion Procedure Note  Hayden [name]  [id number]  Mar 29, 1946  Date:05-24-24  Time:5:01 PM   Provider Performing:Jenin Birdsall FORBES  [name]   Procedure: Insertion of Non-tunneled Central Venous [id number]) with US  guidance (23062)   Indication(s) Medication administration and Difficult access  Consent Risks of the procedure as well as the alternatives and risks of each were explained to the patient and/or caregiver.  Consent for the procedure was obtained and is signed in the bedside chart  Anesthesia Topical only with 1% lidocaine    Timeout Verified patient identification, verified procedure, site/side was marked, verified correct patient position, special equipment/implants available, medications/allergies/relevant history reviewed, required imaging and test results available.  Sterile Technique Maximal sterile technique including full sterile barrier drape, hand hygiene, sterile gown, sterile gloves, mask, hair covering, sterile ultrasound probe cover (if used).  Procedure Description Area of catheter insertion was cleaned with chlorhexidine  and draped in sterile fashion.  With real-time ultrasound guidance a central venous catheter was placed into the right internal jugular vein. Nonpulsatile blood flow and easy flushing noted in all ports.  The catheter was sutured in place and sterile dressing applied.  Complications/Tolerance None; patient tolerated the procedure well. Chest X-ray is ordered to verify placement for internal jugular or subclavian cannulation.   Chest x-ray is not ordered for femoral cannulation.  EBL Minimal  Specimen(s) None   Tinnie FORBES [name], PA-C [place] [facility] 05-24-24 5:01 PM  Please see [email or web address] for pager details.  From 7A-7P if no response, please call [phone] After hours, please call ELink [phone]

## 2024-05-30 NOTE — ED Notes (Signed)
 Admitting team at bedside.

## 2024-05-30 NOTE — Sepsis Progress Note (Signed)
 Code Sepsis protocol being monitored by eLink.

## 2024-05-30 NOTE — Sepsis Progress Note (Addendum)
 Secure chat to MD regarding need for antibiotic order in sepsis bundle. Pt is critically ill with arrhythmia and required intubation. Lactic acid 6.6. Blood cultures and IVF started. Plan is to STAT transfer to [facility] ED.

## 2024-05-30 NOTE — ED Provider Notes (Signed)
 [facility]  Presented there for shortness of breath and weakness.  Found to be COVID-positive and in ventricular tachycardia.  Required intubation, IV medications and transfer here.  They are excepted by the ICU team.  Patient arrives intubated with stable vitals.  Propofol  and amiodarone  infusion ongoing.  Noted to be febrile, Tylenol  suppository ordered.  Patient currently being sedated on propofol  with an amnio drip ongoing.  Still having runs of V. tach, blood pressure is downtrending but stable with systolic over 100.  ICU team notified and will come evaluate.  CU at bedside, has excepted and patient transferred upstairs.   Hayden [name] HERO, DO 06/14/2024 1549

## 2024-05-30 NOTE — Procedures (Signed)
 Arterial Catheter Insertion Procedure Note  MASYN ROSTRO  [id number]  01-Aug-1946  Date:05-29-2024  Time:5:02 PM    Provider Performing: Tinnie FORBES Furth    Procedure: Insertion of Arterial Line (63379) with US  guidance (23062)   Indication(s) Blood pressure monitoring and/or need for frequent ABGs  Consent Risks of the procedure as well as the alternatives and risks of each were explained to the patient and/or caregiver.  Consent for the procedure was obtained and is signed in the bedside chart  Anesthesia None   Time Out Verified patient identification, verified procedure, site/side was marked, verified correct patient position, special equipment/implants available, medications/allergies/relevant history reviewed, required imaging and test results available.   Sterile Technique Maximal sterile technique including full sterile barrier drape, hand hygiene, sterile gown, sterile gloves, mask, hair covering, sterile ultrasound probe cover (if used).   Procedure Description Area of catheter insertion was cleaned with chlorhexidine  and draped in sterile fashion. With real-time ultrasound guidance an arterial catheter was placed into the left radial artery.  Appropriate arterial tracings confirmed on monitor.     Complications/Tolerance None; patient tolerated the procedure well.   EBL Minimal   Specimen(s) None   Tinnie FORBES Furth, PA-C [place] [facility] 29-May-2024 5:02 PM  Please see [email or web address] for pager details.  From 7A-7P if no response, please call [phone] After hours, please call ELink [phone]

## 2024-05-30 NOTE — Progress Notes (Signed)
 Patient had rapid decompensation after arriving to ICU. Initially, patient had worsening tachycardia >200 requiring synchronized cardioversion. He required 1 shock with return to sinus tachycardia. PCCM then placed a-line, central line. He had sudden drop in BP to 60s/30s. He was started on levo/vaso gtt which was quickly up titrated. Drs Claudene and Zenaida made aware of escalating pressor requirements. An ABG was obtained which was not acidotic. He then went into TdP which was cardioverted without response. He was treated with amio 150 and 300 bolus, lidocaine , magnesium  bolus, calcium chloride, multiple amps of bicarb. He had refractory TdP despite efforts and multiple cardioversions. He required chest compressions intermittently throughout this time to obtain ROSC. Ultimately unable to maintain ROSC. Dr. Zenaida spoke with family about inability to achieve perfusing rhythm. Decision was made to move to comfort measures. Chaplain was called.   Tinnie FORBES Furth, PA-C [place] [facility] 28-May-2024 5:31 PM  Please see [email or web address] for pager details.  From 7A-7P if no response, please call [phone] After hours, please call ELink [phone]

## 2024-05-30 NOTE — ED Triage Notes (Addendum)
 Pt via pov from home with sob and weakness since Monday. He also fell today in his bathroom. Denies LOC or head injury. Pt states he stood up from the toilet and felt weak and fell. Pt speaking in breathy phrases. Pt a&o x 4; nad noted.

## 2024-05-30 NOTE — Consult Note (Signed)
 Advanced Heart Failure Team Consult Note   Primary Physician: Sun, Vyvyan, MD Cardiologist:  Gordy Bergamo, MD  Reason for Consultation: VT storm  HPI:    Hayden [name] is seen today for evaluation of VT storm at the request of Dr. Claudene with PCCM. 78 y.o. male with history of CAD, HTN, HLD, HFmrEF.   He has complex history of coronary artery disease w/ prior MI in 1994 s/p PCI to LCX and later CABG X 3 in 2000. Later underwent PTCA/DES proximal SVG to OM3 in 12/24. Continue to have lifestyle limiting angina and underwent PTCA/atherectomy/DES to d LM into p LCX in 7/25.  His son Dale and wife Inocente provide the history. He had been doing well over the last couple of months and was participating in cardiac rehab. He left for [facility] on 09/11, the next day he began feeling poorly w/ weakness, cough and shortness of breath. Later tested positive for COVID-19 on 09/14. Symptoms worsened over the next few days. Presented to [facility] ED this afternoon and was found to be hypoxic and in VT.  He was given 100 mg lidocaine  bolus X 1, 50 mg lidocaine  bolus X 1, Amiodarone  bolus followed by drip, IV mag. Lactic acid elevated at 6.6. ABG 7.117/pCO2 67/HCO3 20. Other labs hemolyzed. T elevated to 104.85F. L > R infiltrates on CXR. Code sepsis activated. Given IVF resuscitation, blood cultures drawn and started empiric abx with vancomycin  and cefepime . He was intubated in ED for hypoxia and transferred to [facility] for further management.  Home Medications Prior to Admission medications   Medication Sig Start Date End Date Taking? Authorizing Provider  amLODipine  (NORVASC ) 10 MG tablet Take 1 tablet (10 mg total) by mouth daily. 06/29/23   Shlomo Wilbert SAUNDERS, MD  ascorbic acid (VITAMIN C) 1000 MG tablet Take 500 mg by mouth daily.    [provider]  aspirin  EC 81 MG tablet Take 1 tablet (81 mg total) by mouth daily. Swallow whole. 02/21/24   Lelon Hamilton T, PA-C  Bexagliflozin (BRENZAVVY) 20 MG TABS Take 1  tablet by mouth daily.    [provider]  bisoprolol  (ZEBETA ) 5 MG tablet Take 0.5 tablets (2.5 mg total) by mouth daily. 04/24/24   Bergamo Gordy, MD  Cholecalciferol  (VITAMIN D ) 2000 UNITS tablet Take 2,000 Units by mouth daily.    [provider]  clopidogrel  (PLAVIX ) 75 MG tablet Take 1 tablet (75 mg total) by mouth daily. 02/07/24   Bergamo Gordy, MD  Coenzyme Q10 (CO Q 10 PO) Take 1 tablet by mouth daily.    [provider]  Evolocumab  (REPATHA  SURECLICK) 140 MG/ML SOAJ Inject 140 mg into the skin every 14 (fourteen) days. 05/16/23   Shlomo Wilbert SAUNDERS, MD  hyoscyamine  (LEVSIN ) 0.125 MG tablet Take 1 tablet (0.125 mg total) by mouth every 4 (four) hours as needed. 09/26/23   Swinyer, Rosaline HERO, NP  levothyroxine  (SYNTHROID ) 75 MCG tablet Take 1 tablet (75 mcg total) by mouth every morning on an empty stomach. 10/17/23     losartan  (COZAAR ) 25 MG tablet Take 1 tablet (25 mg total) by mouth daily. 11/11/23     metFORMIN  (GLUCOPHAGE -XR) 500 MG 24 hr tablet Take 500 mg by mouth 3 (three) times daily. 02/10/22   [provider]  metFORMIN  (GLUCOPHAGE -XR) 500 MG 24 hr tablet Take 3 tablets (1,500 mg total) by mouth every evening with a meal. 03/14/24     molnupiravir  EUA (LAGEVRIO ) 200 MG CAPS capsule Take 4  capsules (800 mg total) by mouth every 12 (twelve) hours. 05/14/24     Multiple Vitamin (MULTIVITAMIN WITH MINERALS) TABS Take 1 tablet by mouth daily.    [provider]  nitrofurantoin , macrocrystal-monohydrate, (MACROBID ) 100 MG capsule Take 1 capsule (100 mg total) by mouth every 12 (twelve) hours. 04/16/24     nitroGLYCERIN  (NITROSTAT ) 0.4 MG SL tablet Place 1 tablet (0.4 mg total) under the tongue every 5 (five) minutes as needed. 08/17/23   Henry Manuelita NOVAK, NP  pantoprazole  (PROTONIX ) 40 MG tablet Take 1 tablet (40 mg total) by mouth daily. 02/07/24 02/06/25  Ladona Heinz, MD  predniSONE  (STERAPRED UNI-PAK 21 TAB) 5 MG (21) TBPK tablet Take as directed Patient  not taking: Reported on 03/28/2024 02/23/24       Past Medical History: Past Medical History:  Diagnosis Date   Anxiety disorder    No details   Aortic root dilatation (HCC)    borderline at 38mm by echo 08/2023   Arthritis    Bright's disease age 75   Carotid artery stenosis    1-39% stenosis bilateraly by dopplers 2020   Chronic combined systolic (congestive) and diastolic (congestive) heart failure (HCC)    EF 40-45% by evho 08/2023   Coronary artery disease    MI in 1994 with PTCA of left circ and then coronary bypass 2000   Frequent urination    H/O hiatal hernia    Hyperlipidemia    statin intolerant   Hypertension    Hypogonadism male    No details available but receives replacement therapy   Impaired hearing    both ears   Kidney stones    100  in past plus stones   Myocardial infarction (HCC) 10/31/1989   Opiate addiction (HCC)    PONV (postoperative nausea and vomiting)    pt prefers epidural is possible   Pulmonary regurgitation    moderate by echo 10/2019    Past Surgical History: Past Surgical History:  Procedure Laterality Date   ANGIOPLASTY  1991   BILATERAL KNEE ARTHROSCOPY  yrs ago   CARDIAC CATHETERIZATION  01-17-07   CORONARY ANGIOPLASTY     CORONARY ARTERY BYPASS GRAFT  [phone]   cabg x 3   CORONARY ATHERECTOMY N/A 02/29/2024   Procedure: CORONARY ATHERECTOMY;  Surgeon: Ladona Heinz, MD;  Location: [facility];  Service: Cardiovascular;  Laterality: N/A;  left main/cfx   CORONARY STENT INTERVENTION N/A 08/17/2023   Procedure: CORONARY STENT INTERVENTION;  Surgeon: Verlin Lonni BIRCH, MD;  Location: [facility];  Service: Cardiovascular;  Laterality: N/A;   CORONARY STENT INTERVENTION N/A 02/29/2024   Procedure: CORONARY STENT INTERVENTION;  Surgeon: Ladona Heinz, MD;  Location: [facility];  Service: Cardiovascular;  Laterality: N/A;  left main/CFX   CYSTOSCOPY     10 to 12 times in past   HERNIA REPAIR  1998   with mesh   LEFT  HEART CATH AND CORS/GRAFTS ANGIOGRAPHY N/A 08/17/2023   Procedure: LEFT HEART CATH AND CORS/GRAFTS ANGIOGRAPHY;  Surgeon: Verlin Lonni BIRCH, MD;  Location: [facility];  Service: Cardiovascular;  Laterality: N/A;   LEFT HEART CATH AND CORS/GRAFTS ANGIOGRAPHY N/A 02/29/2024   Procedure: LEFT HEART CATH AND CORS/GRAFTS ANGIOGRAPHY;  Surgeon: Ladona Heinz, MD;  Location: [facility];  Service: Cardiovascular;  Laterality: N/A;   lithortripsy     x 3   LUMBAR LAMINECTOMY  L 3 to L 4   Jul 01 1995   NISSEN FUNDOPLICATION  2006   TOTAL KNEE ARTHROPLASTY  10/27/2011   Procedure: TOTAL KNEE ARTHROPLASTY;  Surgeon: Dempsey LULLA Moan, MD;  Location: [facility];  Service: Orthopedics;  Laterality: Right;    Family History: Family History  Problem Relation Age of Onset   Heart attack Mother    Cancer - Lung Mother        smoked   Cancer - Lung Father        smoked   Heart attack Father     Social History: Social History   Socioeconomic History   Marital status: Married    Spouse name: Not on file   Number of children: Not on file   Years of education: Not on file   Highest education level: Not on file  Occupational History   Occupation: Publishing rights manager: [employer]  Tobacco Use   Smoking status: Never   Smokeless tobacco: Never  Vaping Use   Vaping status: Never Used  Substance and Sexual Activity   Alcohol  use: No    Comment: quit ETOH in 2013   Drug use: Yes    Comment: from [facility]-currently detoxing off opiates   Sexual activity: Not on file  Other Topics Concern   Not on file  Social History Narrative   Not on file   Social Drivers of Health   Financial Resource Strain: Not on file  Food Insecurity: Not on file  Transportation Needs: Not on file  Physical Activity: Not on file  Stress: Not on file  Social Connections: Not on file    Allergies:  Allergies  Allergen Reactions   Celecoxib Other (See Comments)    Per Mar    Penicillins Other (See Comments)    Gi upset    Statins     Myalgias on simvastatin  5mg  2x/week, rosuvastatin, pravastatin, and Vytorin    Sulfa Antibiotics Swelling   Sulfamethoxazole Other (See Comments)    hands and feet web    Objective:    Vital Signs:   Temp:  [97.5 F (36.4 C)] 97.5 F (36.4 C) 31-May-2024 1228) Pulse Rate:  [63-190] 186 05/31/24 1405) Resp:  [19-48] 19 31-May-2024 1400) BP: (111-179)/(75-157) 165/113 2024-05-31 1400) SpO2:  [88 %-100 %] 99 % 2024/05/31 1405) Weight:  [74.7 kg] 74.7 kg 05/31/24 1236)    Weight change: Filed Weights   May 31, 2024 1236  Weight: 74.7 kg    Intake/Output:  No intake or output data in the 24 hours ending May 31, 2024 1452    Physical Exam    General:  Critically ill appearing HEENT: + ETT Cor: Rhythm regular, tachycardic Lungs: mechanical breath sounds Abdomen: soft, nondistended.  Extremities: no edema Neuro: sedated on vent   Telemetry   Monomorphic VT 140s-170s  Labs   Basic Metabolic Panel: No results for input(s): NA, K, CL, CO2, GLUCOSE, BUN, CREATININE, CALCIUM, MG, PHOS in the last 168 hours.  Liver Function Tests: No results for input(s): AST, ALT, ALKPHOS, BILITOT, PROT, ALBUMIN in the last 168 hours. No results for input(s): LIPASE, AMYLASE in the last 168 hours. No results for input(s): AMMONIA in the last 168 hours.  CBC: Recent Labs  Lab 05-31-2024 1255  WBC 11.0*  NEUTROABS 9.9*  HGB 16.4  HCT 46.4  MCV 90.3  PLT 175    Cardiac Enzymes: No results for input(s): CKTOTAL, CKMB, CKMBINDEX, TROPONINI in the last 168 hours.  BNP: BNP (last 3 results) No results for input(s): BNP in the last 8760 hours.  ProBNP (last 3 results) No  results for input(s): PROBNP in the last 8760 hours.   CBG: No results for input(s): GLUCAP in the last 168 hours.  Coagulation Studies: Recent Labs    2024/05/31 1255  LABPROT 14.8  INR 1.1     Imaging   DG Chest  Port 1 View Result Date: 05/31/24 CLINICAL DATA:  Shortness of breath and weakness. EXAM: PORTABLE CHEST 1 VIEW COMPARISON:  July 15, 2016. FINDINGS: Stable cardiomediastinal silhouette. Endotracheal and nasogastric tubes are in grossly good position. Status post coronary bypass graft. Right lung is clear. Left upper lobe opacity is noted most consistent with pneumonia. Bony thorax is unremarkable. IMPRESSION: Endotracheal and nasogastric tubes are in grossly good position. Left upper lobe opacity is noted most consistent with pneumonia. Electronically Signed   By: Lynwood Landy Raddle M.D.   On: 05/31/24 14:04     Medications:     Current Medications:   Infusions:  amiodarone  60 mg/hr (2024-05-31 1320)   Followed by   amiodarone      ceFEPime  (MAXIPIME ) IV     lactated ringers      And   lactated ringers      lactated ringers      propofol  (DIPRIVAN ) infusion 10 mcg/kg/min (May 31, 2024 1352)   vancomycin         Patient Profile   78 y.o. male with history of complex CAD w/ prior CABG and PCIs, HFmrEF, HTN, HLD, DM II.   Admitted with acute respiratory failure and VT storm in setting of COVID pneumonia.  Assessment/Plan   VT storm -Monomorphic VT w/ pulse -Presenting to [facility] ED w/ VT. Received 2 IV lidocaine  boluses, amiodarone  bolus and gtt.  -Remains in VT on arrival to [facility]. Add lidocaine  gtt, start at 1 mg/hr. -If remains refractory to 2 antiarrhythmics, will consider cardioversion on arrival to ICU -Keep K > 4 and Mag > 2.  -Check thyroid  function studies -obtain repeat echo once tachycardia resolves  Shock -Suspect mixed septic/cardiogenic d/t COVID PNA and VT storm w/ baseline ICM -Lactic acid 6.6, trend to clearance -Not currently requiring vasopressor or inotrope support -Received IVF resuscitation, abx  Acute hypoxic respiratory failure COVID PNA -CXR w/ L>R infiltrates -Vent management per CCM -Abx with vanc, cefepime  and doxycycline  -dexamethasone   Acute on  chronic HFmrEF -EF 40-45% on echo in 03/25. EF historically in this range -Suspect LV function currently severely reduced in setting of the above -Repeat echo tomorrow am -GDMT once more stable  CAD -prior MI in 1994 s/p PCI to LCX and later CABG X 3 in 2000.  -PTCA/DES proximal SVG to OM3 in 12/24. -PTCA/atherectomy/DES to d LM into p LCX in 7/25. -Continue DAPT with aspirin  + plavix . Unable to tolerate statins. On repatha  -Doubt ACS but will follow troponin trend     Length of Stay: 0  Orilla Templeman N, PA-C  05/31/2024, 2:52 PM    Advanced Heart Failure Team Pager [phone] (M-F; 7a - 5p)  Please contact [facility] for night-coverage after hours (4p -7a ) and weekends on [email or web address]

## 2024-05-30 NOTE — Progress Notes (Signed)
 Called to Triage concerning oxygen needs, SPO2 was 88%, placed patient on 2 LPM [place]. Patient SPO2 increased to 92%, BS diminished.  Patient was moved to room 4 for  closer monitoring. His oxygen needs increased along with his HR. At this time he was placed on a NRB.SPO2 increased to 92%; his RR was in the 30's. He was getting difficult to oxygenate. RT prepared a HHFNC but , MD decided to intubate. RT prepared the Glidescope, and the ventilator. [facility] was called STAT. Patient was intubated with a 7.5 ETT only one attempt . The ETT was confirmed by x-ray, bilateral chest movement, ETCO2 color change. ETT was secured at 25cm at the lip. [facility] arrived and placed patient on their portable ventilator.

## 2024-05-30 NOTE — Progress Notes (Signed)
 Upon admission, patient was in V-Tach with a HR of 170 with palpable pulse, patient was intubated in the ED and connected to the vent per RT. Immediately started to decompensate Dr Zenaida at bedside and synchronized shock given with . O2 sat dipped to the low 80's. Providers came in the room to begin placing an ART line and central line, while the lines were placed the patient BP dropped down with a systolic in the 70's. Started Norepinephrine ,Lidocaine , Vasopressin , Amio, Propofol , and Fentayl infusion. Patient further decompensated and with recurrent V-Tach. Code begun and CPR started, see code narrator. MD called time of death at 48. Patient family currently at bedside and was informed about all of the events that took place with the patient.

## 2024-05-30 NOTE — ED Provider Notes (Signed)
 [place] [facility] AT [facility] Provider Note   CSN: [id number] Arrival date & time: 06/13/2024  1220     History Chief Complaint  Patient presents with   Weakness   Shortness of Breath    HPI: Hayden [name] is a 78 y.o. male with history pertinent for prior prolonged QT, aortic root dilation, CHF, CAD s/p CABG x3 in 2000, recent stent placement in cardiac rehab, hyperlipidemia, hypertension who presents complaining of malaise, shortness of breath. Patient arrived via POV accompanied by wife.  History provided by patient and spouse/partner.  No interpreter required during this encounter.  Patient reports that he has had several days of malaise in setting of recent diagnosis of COVID.  Reports that symptoms worsened today, and he developed chest pain and shortness of breath.  Wife reports extensive cardiac history.  Further history limited by patient's acute condition.  Patient's recorded medical, surgical, social, medication list and allergies were reviewed in the Snapshot window as part of the initial history.   Prior to Admission medications   Medication Sig Start Date End Date Taking? Authorizing Provider  amLODipine  (NORVASC ) 10 MG tablet Take 1 tablet (10 mg total) by mouth daily. 06/29/23   Shlomo Wilbert SAUNDERS, MD  ascorbic acid (VITAMIN C) 1000 MG tablet Take 500 mg by mouth daily.    [provider]  aspirin  EC 81 MG tablet Take 1 tablet (81 mg total) by mouth daily. Swallow whole. 02/21/24   Lelon Hamilton T, PA-C  Bexagliflozin (BRENZAVVY) 20 MG TABS Take 1 tablet by mouth daily.    [provider]  bisoprolol  (ZEBETA ) 5 MG tablet Take 0.5 tablets (2.5 mg total) by mouth daily. 04/24/24   Ladona Heinz, MD  Cholecalciferol  (VITAMIN D ) 2000 UNITS tablet Take 2,000 Units by mouth daily.    [provider]  clopidogrel  (PLAVIX ) 75 MG tablet Take 1 tablet (75 mg total) by mouth daily. 02/07/24   Ladona Heinz, MD  Coenzyme Q10 (CO Q 10 PO) Take  1 tablet by mouth daily.    [provider]  Evolocumab  (REPATHA  SURECLICK) 140 MG/ML SOAJ Inject 140 mg into the skin every 14 (fourteen) days. 05/16/23   Shlomo Wilbert SAUNDERS, MD  hyoscyamine  (LEVSIN ) 0.125 MG tablet Take 1 tablet (0.125 mg total) by mouth every 4 (four) hours as needed. 09/26/23   Swinyer, Rosaline HERO, NP  levothyroxine  (SYNTHROID ) 75 MCG tablet Take 1 tablet (75 mcg total) by mouth every morning on an empty stomach. 10/17/23     losartan  (COZAAR ) 25 MG tablet Take 1 tablet (25 mg total) by mouth daily. 11/11/23     metFORMIN  (GLUCOPHAGE -XR) 500 MG 24 hr tablet Take 500 mg by mouth 3 (three) times daily. 02/10/22   [provider]  metFORMIN  (GLUCOPHAGE -XR) 500 MG 24 hr tablet Take 3 tablets (1,500 mg total) by mouth every evening with a meal. 03/14/24     molnupiravir  EUA (LAGEVRIO ) 200 MG CAPS capsule Take 4 capsules (800 mg total) by mouth every 12 (twelve) hours. 05/14/24     Multiple Vitamin (MULTIVITAMIN WITH MINERALS) TABS Take 1 tablet by mouth daily.    [provider]  nitrofurantoin , macrocrystal-monohydrate, (MACROBID ) 100 MG capsule Take 1 capsule (100 mg total) by mouth every 12 (twelve) hours. 04/16/24     nitroGLYCERIN  (NITROSTAT ) 0.4 MG SL tablet Place 1 tablet (0.4 mg total) under the tongue every 5 (five) minutes as needed. 08/17/23   Henry Manuelita NOVAK, NP  pantoprazole  (PROTONIX ) 40 MG tablet Take  1 tablet (40 mg total) by mouth daily. 02/07/24 02/06/25  Ladona Heinz, MD  predniSONE  (STERAPRED UNI-PAK 21 TAB) 5 MG (21) TBPK tablet Take as directed Patient not taking: Reported on 03/28/2024 02/23/24        Allergies: Celecoxib, Penicillins, Statins, Sulfa antibiotics, and Sulfamethoxazole   Review of Systems   ROS as per HPI  Physical Exam Updated Vital Signs BP (!) 141/62 (BP Location: Right Arm)   Pulse (!) 167   Temp (!) 104.4 F (40.2 C) (Bladder)   Resp 16   Ht 5' 7.5 (1.715 m)   Wt 74.7 kg   SpO2 93%   BMI 25.41 kg/m  Physical  Exam Constitutional:      General: He is in acute distress.     Appearance: He is ill-appearing, toxic-appearing and diaphoretic.  HENT:     Head: Normocephalic and atraumatic.  Eyes:     Extraocular Movements: Extraocular movements intact.     Pupils: Pupils are equal, round, and reactive to light.  Cardiovascular:     Rate and Rhythm: Tachycardia present. Rhythm irregular.  Pulmonary:     Effort: Tachypnea and accessory muscle usage present.  Musculoskeletal:     Right lower leg: No tenderness. No edema.     Left lower leg: No tenderness. No edema.  Skin:    General: Skin is warm.  Neurological:     Mental Status: He is alert.     ED Course/ Medical Decision Making/ A&P    Procedures Procedure Name: Intubation Date/Time: 2024/06/14 3:45 PM  Performed by: Rogelia Jerilynn RAMAN, MDPre-anesthesia Checklist: Patient identified, Patient being monitored, Emergency Drugs available, Timeout performed and Suction available Oxygen Delivery Method: Ambu bag Preoxygenation: Pre-oxygenation with 100% oxygen Induction Type: IV induction Ventilation: Mask ventilation without difficulty Laryngoscope Size: Glidescope, 3 and Mac Grade View: Grade I Tube size: 7.5 mm Number of attempts: 1 Airway Equipment and Method: Stylet Placement Confirmation: ETT inserted through vocal cords under direct vision, Positive ETCO2, Breath sounds checked- equal and bilateral and CO2 detector Secured at: 25 cm Tube secured with: ETT holder Dental Injury: Teeth and Oropharynx as per pre-operative assessment     .Critical Care  Performed by: Rogelia Jerilynn RAMAN, MD Authorized by: Rogelia Jerilynn RAMAN, MD   Critical care provider statement:    Critical care time (minutes):  76   Critical care start time:  06-14-24 12:45 PM   Critical care end time:  Jun 14, 2024 2:15 PM   Critical care time was exclusive of:  Separately billable procedures and treating other patients   Critical care was necessary to treat  or prevent imminent or life-threatening deterioration of the following conditions:  Cardiac failure, CNS failure or compromise, sepsis and respiratory failure   Critical care was time spent personally by me on the following activities:  Development of treatment plan with patient or surrogate, discussions with consultants, evaluation of patient's response to treatment, examination of patient, ordering and review of laboratory studies, ordering and review of radiographic studies, ordering and performing treatments and interventions, pulse oximetry, re-evaluation of patient's condition, review of old charts and obtaining history from patient or surrogate   Care discussed with: accepting provider at another facility      Medications Ordered in ED Medications  lactated ringers  infusion (has no administration in time range)  lactated ringers  bolus 1,000 mL (1,000 mLs Intravenous New Bag/Given 06-14-2024 1309)    And  lactated ringers  bolus 1,000 mL (1,000 mLs Intravenous New Bag/Given 06-14-2024 1501)  And  lactated ringers  bolus 250 mL (has no administration in time range)  amiodarone  (NEXTERONE ) 1.8 mg/mL load via infusion 150 mg (150 mg Intravenous Bolus from Bag June 09, 2024 1312)    Followed by  amiodarone  (NEXTERONE  PREMIX) 360-4.14 MG/200ML-% (1.8 mg/mL) IV infusion (60 mg/hr Intravenous New Bag/Given 2024-06-09 1320)    Followed by  amiodarone  (NEXTERONE  PREMIX) 360-4.14 MG/200ML-% (1.8 mg/mL) IV infusion (has no administration in time range)  propofol  (DIPRIVAN ) 1000 MG/100ML infusion (20 mcg/kg/min  74.7 kg Intravenous Rate/Dose Change Jun 09, 2024 1519)  vancomycin  (VANCOREADY) IVPB 500 mg/100 mL (has no administration in time range)  Chlorhexidine  Gluconate Cloth 2 % PADS 6 each (has no administration in time range)  polyethylene glycol (MIRALAX  / GLYCOLAX ) packet 17 g (has no administration in time range)  heparin  injection 5,000 Units (has no administration in time range)  famotidine  (PEPCID ) tablet  20 mg (has no administration in time range)  Oral care mouth rinse (has no administration in time range)  Oral care mouth rinse (has no administration in time range)  acetaminophen  (TYLENOL ) tablet 650 mg (has no administration in time range)    Or  acetaminophen  (TYLENOL ) 160 MG/5ML solution 650 mg (has no administration in time range)    Or  acetaminophen  (TYLENOL ) suppository 650 mg (has no administration in time range)  busPIRone  (BUSPAR ) tablet 30 mg (has no administration in time range)    Or  busPIRone  (BUSPAR ) tablet 30 mg (has no administration in time range)  magnesium  sulfate IVPB 2 g 50 mL (has no administration in time range)  lidocaine  (cardiac) 2000 mg in dextrose  5% 500 mL (4mg /mL) IV infusion (1 mg/min Intravenous New Bag/Given 06-09-24 1611)  dexmedetomidine  (PRECEDEX ) 400 MCG/100ML (4 mcg/mL) infusion (has no administration in time range)  acetaminophen  (TYLENOL ) tablet 650 mg (has no administration in time range)    Or  acetaminophen  (TYLENOL ) 160 MG/5ML solution 650 mg (has no administration in time range)    Or  acetaminophen  (TYLENOL ) suppository 650 mg (has no administration in time range)  insulin  aspart (novoLOG ) injection 0-9 Units (has no administration in time range)  dexamethasone  (DECADRON ) injection 6 mg (has no administration in time range)  doxycycline  (VIBRAMYCIN ) 100 mg in sodium chloride  0.9 % 250 mL IVPB (has no administration in time range)  docusate (COLACE) 50 MG/5ML liquid 100 mg (has no administration in time range)  polyethylene glycol (MIRALAX  / GLYCOLAX ) packet 17 g (has no administration in time range)  fentaNYL  (SUBLIMAZE ) injection 50 mcg (25 mcg Intravenous Given 06/09/2024 1614)  fentaNYL  (SUBLIMAZE ) injection 50-200 mcg (has no administration in time range)  clopidogrel  (PLAVIX ) tablet 75 mg (has no administration in time range)  aspirin  chewable tablet 81 mg (has no administration in time range)  lidocaine  (cardiac) 100 mg/61mL (XYLOCAINE )  100 MG/5ML injection 2% (has no administration in time range)  lidocaine  4-5 MG/ML-% infusion (has no administration in time range)  magnesium  sulfate (IV Push/IM) injection 1 g (1 g Intravenous Given 06-09-2024 1248)  lidocaine  (cardiac) 100 mg/63mL (XYLOCAINE ) injection 2% 100 mg (100 mg Intravenous Given 09-Jun-2024 1253)  magnesium  sulfate (IV Push/IM) injection 1 g (1 g Intravenous Given 2024/06/09 1257)  lidocaine  (cardiac) 100 mg/21mL (XYLOCAINE ) injection 2% 50 mg (50 mg Intravenous Given 2024-06-09 1301)  amiodarone  (NEXTERONE ) 1.5 mg/mL IV bolus only (150 mg  New Bag/Given 2024/06/09 1312)  ketamine  (KETALAR ) injection 75 mg (75 mg Intravenous Given 06-09-2024 1341)  rocuronium  (ZEMURON ) injection 75 mg (75 mg Intravenous Given June 09, 2024 1343)  ceFEPIme  (MAXIPIME ) 2 g  in sodium chloride  0.9 % 100 mL IVPB (2 g Intravenous New Bag/Given 07-Jun-2024 1501)  vancomycin  (VANCOCIN ) IVPB 1000 mg/200 mL premix (1,000 mg Intravenous New Bag/Given June 07, 2024 1502)  acetaminophen  (TYLENOL ) suppository 650 mg (650 mg Rectal Given 07-Jun-2024 1502)    Medical Decision Making:   Hayden [name] is a 78 y.o. male who presents for malaise as per above.  Physical exam is pertinent for acute distress, tachycardia with irregular rhythm and variable rate, primarily in the 180s, tachypnea, respiratory distress.   The differential includes but is not limited to sepsis, respiratory failure, ventricular storm, ACS.  Independent historian: Spouse/partner  External data reviewed: EKG and Notes  Labs: Ordered, Independent interpretation, and Details: Lactic acid elevated at 6.6.  CBC with leukocytosis to 11, no anemia or thrombocytopenia.  Radiology: Ordered, Independent interpretation, and Details: Chest x-ray with appropriate positioning of ETT and NGT, radiology read not yet available at the time of transfer [facility] 1 View Result Date: June 07, 2024 CLINICAL DATA:  Shortness of breath and weakness. EXAM: PORTABLE CHEST 1 VIEW  COMPARISON:  July 15, 2016. FINDINGS: Stable cardiomediastinal silhouette. Endotracheal and nasogastric tubes are in grossly good position. Status post coronary bypass graft. Right lung is clear. Left upper lobe opacity is noted most consistent with pneumonia. Bony thorax is unremarkable. IMPRESSION: Endotracheal and nasogastric tubes are in grossly good position. Left upper lobe opacity is noted most consistent with pneumonia. Electronically Signed   By: Lynwood Landy Raddle M.D.   On: 06/07/2024 14:04    EKG/Medicine tests: Ordered and Independent interpretation EKG Interpretation: Ventricular tachycardia Confirmed by Rogelia Satterfield ([id number]) on 2024/06/07 3:53:48 PM  Interventions: LR bolus, 2 g IV push magnesium , lidocaine  100 mg, lidocaine  50 mg, amiodarone  150 mg, Amio gtt.  Ketamine  75 mg, rocuronium  75 mg, propofol  gtt.  See the EMR for full details regarding lab and imaging results.  Patient presents in acute distress, patient with nonsustained ventricular tachycardia, manual blood pressure obtained and WNL.  Asked patient regarding goals of care, patient reports that he would want CPR, and is amenable to intubation as well.  Wife reports that patient has previously had DNR, patient confirms that he has previously been DNR, however currently would like to be full code.  Patient initiated as code sepsis, orders placed by Fleming, PA-C, on my behalf.  Patient with increased respiratory effort, initially saturating well on 2 L, however has progressive hypoxia requiring escalation to 6 L nasal cannula, and thereafter nonrebreather.  For ventricular tachycardia, patient given 2 g magnesium  IV push, as well as 100 mg lidocaine .  Patient with no significant change after 100 mg lidocaine , therefore given additional 50 mg lidocaine , additionally with no improvement.  Several minutes thereafter, while making preparations for initiation of lidocaine  gtt., patient had sustained ventricular tachycardia with  rates in the 250s, though still alert, normotensive.  Given clinical worsening, medication plan changed to amiodarone , patient given 150 mg bolus with improvement in heart rates to 150s, and thereafter started on amiodarone  drip.  Consulted cardiology, who accepted patient as a transfer to [facility], likely for ICU acceptance.  Ozell Marine, ED provider at [facility] excepted patient as a transfer.  Patient with continued respiratory distress, tachypnea to the 40s, increased work of breathing, and saturations downtrending to the 80s despite nonrebreather.  Do not feel that patient respiratory status is appropriate for interfacility transfer without securing airway, therefore patient intubated as per procedure note above.  Patient maintained appropriate blood pressure, and did not  lose pulses peri-intubation.  Chest x-ray confirms appropriate placement of ET tube.  Shortly thereafter, [facility] arrived and transported patient to [facility].  Updates provided to wife consistently throughout patient's ED stay.  Presentation is most consistent with acute life/limb-threatening illness and Current presentation is complicated by underlying chronic conditions  Discussion of management or test interpretations with external provider(s): Cardiology  Risk Drugs:Prescription drug management, Parenteral controlled substances, and Drug therapy requiring intensive monitoring for toxicity Treatment: Decision regarding hospitalization Critical Care: 76 minutes  Disposition: Transfer ED to ED to [facility] for admission to ICU  MDM generated using voice dictation software and may contain dictation errors.  Please contact me for any clarification or with any questions.  Clinical Impression:  1. Ventricular tachycardia (HCC)   2. COVID   3. Acute hypoxic respiratory failure (HCC)   4. Sepsis with acute hypoxic respiratory failure, due to unspecified organism, unspecified whether septic shock present (HCC)      Admit    Final Clinical Impression(s) / ED Diagnoses Final diagnoses:  Ventricular tachycardia (HCC)  COVID  Acute hypoxic respiratory failure (HCC)  Sepsis with acute hypoxic respiratory failure, due to unspecified organism, unspecified whether septic shock present [facility])    Rx / DC Orders ED Discharge Orders     None        Rogelia Jerilynn RAMAN, MD 06/12/2024 1615

## 2024-05-30 NOTE — ED Notes (Signed)
 Called [facility] for STAT ED to ED tx via [facility]; accepting physician: Ozell Marine, MD

## 2024-05-30 DEATH — deceased

## 2024-06-01 ENCOUNTER — Encounter (HOSPITAL_COMMUNITY)

## 2024-06-04 ENCOUNTER — Encounter (HOSPITAL_COMMUNITY)

## 2024-06-06 ENCOUNTER — Encounter (HOSPITAL_COMMUNITY)

## 2024-06-08 ENCOUNTER — Encounter (HOSPITAL_COMMUNITY)

## 2024-06-11 ENCOUNTER — Encounter (HOSPITAL_COMMUNITY)

## 2024-06-13 ENCOUNTER — Encounter (HOSPITAL_COMMUNITY)

## 2024-06-15 ENCOUNTER — Encounter (HOSPITAL_COMMUNITY)

## 2024-06-18 ENCOUNTER — Encounter (HOSPITAL_COMMUNITY)

## 2024-06-20 ENCOUNTER — Encounter (HOSPITAL_COMMUNITY)
